# Patient Record
Sex: Female | Born: 1950 | Race: White | Hispanic: No | Smoking: Former smoker
Health system: Southern US, Community
[De-identification: ages and names within clinical notes are randomized; demographics above are authoritative.]

## PROBLEM LIST (undated history)

## (undated) DIAGNOSIS — I509 Heart failure, unspecified: Secondary | ICD-10-CM

## (undated) DIAGNOSIS — D509 Iron deficiency anemia, unspecified: Secondary | ICD-10-CM

## (undated) DIAGNOSIS — E119 Type 2 diabetes mellitus without complications: Secondary | ICD-10-CM

## (undated) DIAGNOSIS — J9600 Acute respiratory failure, unspecified whether with hypoxia or hypercapnia: Secondary | ICD-10-CM

## (undated) DIAGNOSIS — I10 Essential (primary) hypertension: Secondary | ICD-10-CM

## (undated) DIAGNOSIS — I2510 Atherosclerotic heart disease of native coronary artery without angina pectoris: Secondary | ICD-10-CM

## (undated) DIAGNOSIS — N183 Chronic kidney disease, stage 3 unspecified: Secondary | ICD-10-CM

## (undated) DIAGNOSIS — E079 Disorder of thyroid, unspecified: Secondary | ICD-10-CM

## (undated) DIAGNOSIS — E785 Hyperlipidemia, unspecified: Secondary | ICD-10-CM

## (undated) HISTORY — DX: Hyperlipidemia, unspecified: E78.5

## (undated) HISTORY — DX: Chronic kidney disease, stage 3 unspecified: N18.30

## (undated) HISTORY — DX: Atherosclerotic heart disease of native coronary artery without angina pectoris: I25.10

## (undated) HISTORY — DX: Chronic kidney disease, stage 3 (moderate): N18.3

---

## 2014-11-16 DIAGNOSIS — D509 Iron deficiency anemia, unspecified: Secondary | ICD-10-CM | POA: Insufficient documentation

## 2016-01-16 DIAGNOSIS — I5030 Unspecified diastolic (congestive) heart failure: Secondary | ICD-10-CM | POA: Insufficient documentation

## 2017-06-28 ENCOUNTER — Encounter: Payer: Self-pay | Admitting: Podiatry

## 2017-06-28 ENCOUNTER — Ambulatory Visit (INDEPENDENT_AMBULATORY_CARE_PROVIDER_SITE_OTHER): Payer: Medicare Other | Admitting: Podiatry

## 2017-06-28 DIAGNOSIS — B351 Tinea unguium: Secondary | ICD-10-CM

## 2017-06-28 DIAGNOSIS — E11621 Type 2 diabetes mellitus with foot ulcer: Secondary | ICD-10-CM | POA: Diagnosis not present

## 2017-06-28 DIAGNOSIS — E1159 Type 2 diabetes mellitus with other circulatory complications: Secondary | ICD-10-CM | POA: Diagnosis not present

## 2017-06-28 DIAGNOSIS — M79676 Pain in unspecified toe(s): Secondary | ICD-10-CM

## 2017-06-28 DIAGNOSIS — L97511 Non-pressure chronic ulcer of other part of right foot limited to breakdown of skin: Secondary | ICD-10-CM

## 2017-06-28 NOTE — Progress Notes (Signed)
This patient presents to the office with chief complaint of long thick painful nails. Patient states that her nails have not been treated for over a year.  She also says that she has developed spots that have developed on her second and third toes, right foot.  She says these areas bleed on occasion.  This patient says that she injured her second and third toes, years ago and never sought any treatment at that time.  She says she has provided no self treatment nor sought any professional help for her feet. She says she is part of a program where a nurse visits diabetics once  year for a complete physical .  The nurse recommended that she make an appointment with the podiatrist for a complete foot evaluation  GENERAL APPEARANCE: Alert, conversant. Appropriately groomed. No acute distress.  VASCULAR: Pedal pulses are not   palpable at  [facility] and PT bilateral.  Capillary refill time is immediate to all digits,  Normal temperature gradient.   NEUROLOGIC: sensation is diminished  to 5.07 monofilament at 5/5 sites bilateral.  Light touch is intact bilateral, Muscle strength normal.  MUSCULOSKELETAL: acceptable muscle strength, tone and stability bilateral.  Intrinsic muscluature intact bilateral.  Rectus appearance of foot and digits noted bilateral.  Second and third toes are in a dorsi flex position at the level of the DIPJ.   NAILS   thick disfigured discolored nails with subungual debris noted both feet DERMATOLOGIC: skin color, texture, and turgor are within normal limits with the exception of the second and third toes, right foot.  These toes have a dorsal ulceration noted on the right foot.  These ulcerations are noted at the level of the DIPJ.  There is also a purplish discoloration over the met heads of the second and third MPJs right foot.     Diagnosis  diabetic ulcer, second and third toes, right foot   onychomycosis 10   IE  debridement of onychomycotic nails both feet.  Debridement of necrotic  tissue on the dorsum of the second and third toes, right foot.  Ulcers were bandaged with Iodosorb and dry sterile dressing.  There appears to be no evidence of any acute or even chronic infection, second and third toes, right foot.  These toes became dislocated when she injured her toes and have led to a dorsiflexed position of the distal aspect of the second and third toes.  Due to the position. She has developed 2 ulcers.  We discussed her soaking and bandaging her foot at home for 2 weeks.  We will reevaluate her at that time and provide further treatment.  Possibly we can take a radiographic x-ray to understand her pathology   Gardiner Barefoot DPM

## 2017-07-13 ENCOUNTER — Ambulatory Visit (INDEPENDENT_AMBULATORY_CARE_PROVIDER_SITE_OTHER): Payer: Self-pay | Admitting: Podiatry

## 2017-07-13 DIAGNOSIS — E1159 Type 2 diabetes mellitus with other circulatory complications: Secondary | ICD-10-CM

## 2017-07-13 DIAGNOSIS — L97511 Non-pressure chronic ulcer of other part of right foot limited to breakdown of skin: Secondary | ICD-10-CM | POA: Diagnosis not present

## 2017-07-13 DIAGNOSIS — E11621 Type 2 diabetes mellitus with foot ulcer: Secondary | ICD-10-CM

## 2017-07-13 NOTE — Progress Notes (Signed)
This patient presents the office for continued evaluation of a diabetic ulcer on the second and third toes, right foot.  She says she has been soaking her foot and bandaged her foot.  She says that her toes feel fine and they look much better than the last visit 2 weeks ago. She says she is not having any pain or discomfort at this time.  She presents the office today for continued evaluation of these diabetic ulcers   GENERAL APPEARANCE: Alert, conversant. Appropriately groomed. No acute distress.  VASCULAR: Pedal [facility] are not   [facility]  [facility].  Capillary refill time is immediate to all digits,  Normal temperature gradient.  Digital hair growth is present bilateral  NEUROLOGIC: sensation is diminished to 5.07 monofilament at 5/5 sites bilateral.  Light touch is intact bilateral, Muscle strength normal.  MUSCULOSKELETAL: acceptable muscle strength, tone and stability bilateral.    Dorsiflex position of the second and third toes, right foot NAILS  thick disfigured discolored nails with subungual debris. No evidence of any bacterial infection or drainage DERMATOLOGIC: skin color, texture, and turgor are within normal limits.  No preulcerative lesions or ulcers  are seen, no interdigital maceration noted.  No open lesions present.   No drainage noted. Healing of diabetic ulcers. The second and third toes, right foot  S/P healed diabetic ulcer second and third toes right foot.   ROV.  Patient presents the office stating that she is having no pain and discomfort in her toes have healed.  She desires to return to the office in 10 weeks for continued diabetic foot care to prevent any future ulcers or infections.   Helane GuntherGregory Auron Tadros DPM

## 2017-07-24 ENCOUNTER — Observation Stay (HOSPITAL_COMMUNITY): Payer: Medicare Other

## 2017-07-24 ENCOUNTER — Inpatient Hospital Stay (HOSPITAL_COMMUNITY): Payer: Medicare Other

## 2017-07-24 ENCOUNTER — Encounter (HOSPITAL_COMMUNITY)
Admission: EM | Disposition: A | Discharge status: Home / Self Care | Payer: Self-pay | Source: Home / Self Care | Attending: Family Medicine

## 2017-07-24 ENCOUNTER — Inpatient Hospital Stay (HOSPITAL_COMMUNITY): Payer: Medicare Other | Admitting: Anesthesiology

## 2017-07-24 ENCOUNTER — Encounter (HOSPITAL_COMMUNITY): Payer: Self-pay | Admitting: Emergency Medicine

## 2017-07-24 ENCOUNTER — Inpatient Hospital Stay (HOSPITAL_COMMUNITY)
Admission: EM | Admit: 2017-07-24 | Discharge: 2017-07-30 | DRG: 580 | Disposition: A | Discharge status: Home / Self Care | Payer: Medicare Other | Attending: Family Medicine | Admitting: Family Medicine

## 2017-07-24 DIAGNOSIS — R2681 Unsteadiness on feet: Secondary | ICD-10-CM

## 2017-07-24 DIAGNOSIS — W1830XA Fall on same level, unspecified, initial encounter: Secondary | ICD-10-CM | POA: Diagnosis present

## 2017-07-24 DIAGNOSIS — I130 Hypertensive heart and chronic kidney disease with heart failure and stage 1 through stage 4 chronic kidney disease, or unspecified chronic kidney disease: Secondary | ICD-10-CM | POA: Diagnosis present

## 2017-07-24 DIAGNOSIS — J9811 Atelectasis: Secondary | ICD-10-CM | POA: Diagnosis not present

## 2017-07-24 DIAGNOSIS — Z794 Long term (current) use of insulin: Secondary | ICD-10-CM

## 2017-07-24 DIAGNOSIS — Z87891 Personal history of nicotine dependence: Secondary | ICD-10-CM

## 2017-07-24 DIAGNOSIS — I2510 Atherosclerotic heart disease of native coronary artery without angina pectoris: Secondary | ICD-10-CM | POA: Diagnosis present

## 2017-07-24 DIAGNOSIS — E662 Morbid (severe) obesity with alveolar hypoventilation: Secondary | ICD-10-CM | POA: Diagnosis present

## 2017-07-24 DIAGNOSIS — L03114 Cellulitis of left upper limb: Principal | ICD-10-CM | POA: Diagnosis present

## 2017-07-24 DIAGNOSIS — Z23 Encounter for immunization: Secondary | ICD-10-CM

## 2017-07-24 DIAGNOSIS — Z7901 Long term (current) use of anticoagulants: Secondary | ICD-10-CM

## 2017-07-24 DIAGNOSIS — N183 Chronic kidney disease, stage 3 (moderate): Secondary | ICD-10-CM | POA: Diagnosis present

## 2017-07-24 DIAGNOSIS — S61409A Unspecified open wound of unspecified hand, initial encounter: Secondary | ICD-10-CM | POA: Diagnosis present

## 2017-07-24 DIAGNOSIS — S2002XA Contusion of left breast, initial encounter: Secondary | ICD-10-CM | POA: Diagnosis present

## 2017-07-24 DIAGNOSIS — R0603 Acute respiratory distress: Secondary | ICD-10-CM | POA: Diagnosis present

## 2017-07-24 DIAGNOSIS — Z79899 Other long term (current) drug therapy: Secondary | ICD-10-CM

## 2017-07-24 DIAGNOSIS — E8779 Other fluid overload: Secondary | ICD-10-CM | POA: Diagnosis present

## 2017-07-24 DIAGNOSIS — L089 Local infection of the skin and subcutaneous tissue, unspecified: Secondary | ICD-10-CM | POA: Diagnosis present

## 2017-07-24 DIAGNOSIS — E1110 Type 2 diabetes mellitus with ketoacidosis without coma: Secondary | ICD-10-CM | POA: Diagnosis present

## 2017-07-24 DIAGNOSIS — I482 Chronic atrial fibrillation: Secondary | ICD-10-CM | POA: Diagnosis present

## 2017-07-24 DIAGNOSIS — R079 Chest pain, unspecified: Secondary | ICD-10-CM

## 2017-07-24 DIAGNOSIS — L0390 Cellulitis, unspecified: Secondary | ICD-10-CM | POA: Diagnosis present

## 2017-07-24 DIAGNOSIS — G4733 Obstructive sleep apnea (adult) (pediatric): Secondary | ICD-10-CM | POA: Diagnosis present

## 2017-07-24 DIAGNOSIS — R0902 Hypoxemia: Secondary | ICD-10-CM | POA: Diagnosis not present

## 2017-07-24 DIAGNOSIS — E1122 Type 2 diabetes mellitus with diabetic chronic kidney disease: Secondary | ICD-10-CM | POA: Diagnosis present

## 2017-07-24 DIAGNOSIS — S61412A Laceration without foreign body of left hand, initial encounter: Secondary | ICD-10-CM | POA: Diagnosis present

## 2017-07-24 DIAGNOSIS — E559 Vitamin D deficiency, unspecified: Secondary | ICD-10-CM | POA: Diagnosis present

## 2017-07-24 DIAGNOSIS — I96 Gangrene, not elsewhere classified: Secondary | ICD-10-CM | POA: Diagnosis present

## 2017-07-24 DIAGNOSIS — E785 Hyperlipidemia, unspecified: Secondary | ICD-10-CM | POA: Diagnosis present

## 2017-07-24 DIAGNOSIS — R0602 Shortness of breath: Secondary | ICD-10-CM

## 2017-07-24 DIAGNOSIS — R000 Tachycardia, unspecified: Secondary | ICD-10-CM | POA: Diagnosis present

## 2017-07-24 DIAGNOSIS — S61402A Unspecified open wound of left hand, initial encounter: Secondary | ICD-10-CM

## 2017-07-24 DIAGNOSIS — Y92000 Kitchen of unspecified non-institutional (private) residence as  the place of occurrence of the external cause: Secondary | ICD-10-CM

## 2017-07-24 DIAGNOSIS — D509 Iron deficiency anemia, unspecified: Secondary | ICD-10-CM | POA: Diagnosis present

## 2017-07-24 DIAGNOSIS — Z6841 Body Mass Index (BMI) 40.0 and over, adult: Secondary | ICD-10-CM

## 2017-07-24 DIAGNOSIS — L03119 Cellulitis of unspecified part of limb: Secondary | ICD-10-CM | POA: Diagnosis not present

## 2017-07-24 DIAGNOSIS — I5032 Chronic diastolic (congestive) heart failure: Secondary | ICD-10-CM | POA: Diagnosis present

## 2017-07-24 DIAGNOSIS — E039 Hypothyroidism, unspecified: Secondary | ICD-10-CM | POA: Diagnosis present

## 2017-07-24 HISTORY — DX: Type 2 diabetes mellitus without complications: E11.9

## 2017-07-24 HISTORY — DX: Iron deficiency anemia, unspecified: D50.9

## 2017-07-24 HISTORY — DX: Heart failure, unspecified: I50.9

## 2017-07-24 HISTORY — DX: Essential (primary) hypertension: I10

## 2017-07-24 HISTORY — DX: Disorder of thyroid, unspecified: E07.9

## 2017-07-24 HISTORY — PX: I & D EXTREMITY: SHX5045

## 2017-07-24 LAB — BASIC METABOLIC PANEL
Anion gap: 10 (ref 5–15)
BUN: 40 mg/dL — ABNORMAL HIGH (ref 6–20)
CALCIUM: 9.7 mg/dL (ref 8.9–10.3)
CO2: 26 mmol/L (ref 22–32)
CREATININE: 1.5 mg/dL — AB (ref 0.44–1.00)
Chloride: 101 mmol/L (ref 101–111)
GFR calc Af Amer: 40 mL/min — ABNORMAL LOW (ref >60)
GFR calc non Af Amer: 35 mL/min — ABNORMAL LOW (ref >60)
GLUCOSE: 274 mg/dL — AB (ref 65–99)
Potassium: 4.9 mmol/L (ref 3.5–5.1)
Sodium: 137 mmol/L (ref 135–145)

## 2017-07-24 LAB — CBC WITH DIFFERENTIAL/PLATELET
Basophils Absolute: 0 10*3/uL (ref 0.0–0.1)
Basophils Relative: 0 %
Eosinophils Absolute: 0.3 10*3/uL (ref 0.0–0.7)
Eosinophils Relative: 2 %
HEMATOCRIT: 43 % (ref 36.0–46.0)
Hemoglobin: 13.8 g/dL (ref 12.0–15.0)
LYMPHS ABS: 1.1 10*3/uL (ref 0.7–4.0)
LYMPHS PCT: 8 %
MCH: 30.4 pg (ref 26.0–34.0)
MCHC: 32.1 g/dL (ref 30.0–36.0)
MCV: 94.7 fL (ref 78.0–100.0)
MONO ABS: 1 10*3/uL (ref 0.1–1.0)
MONOS PCT: 7 %
NEUTROS ABS: 11.6 10*3/uL — AB (ref 1.7–7.7)
Neutrophils Relative %: 83 %
Platelets: 270 10*3/uL (ref 150–400)
RBC: 4.54 MIL/uL (ref 3.87–5.11)
RDW: 13.3 % (ref 11.5–15.5)
WBC: 14 10*3/uL — ABNORMAL HIGH (ref 4.0–10.5)

## 2017-07-24 LAB — URINALYSIS, ROUTINE W REFLEX MICROSCOPIC
BILIRUBIN URINE: NEGATIVE
Glucose, UA: 50 mg/dL — AB
Hgb urine dipstick: NEGATIVE
KETONES UR: NEGATIVE mg/dL
NITRITE: POSITIVE — AB
Protein, ur: 100 mg/dL — AB
SPECIFIC GRAVITY, URINE: 1.012 (ref 1.005–1.030)
pH: 6 (ref 5.0–8.0)

## 2017-07-24 LAB — I-STAT CG4 LACTIC ACID, ED: LACTIC ACID, VENOUS: 1.33 mmol/L (ref 0.5–1.9)

## 2017-07-24 LAB — GLUCOSE, CAPILLARY
GLUCOSE-CAPILLARY: 171 mg/dL — AB (ref 65–99)
GLUCOSE-CAPILLARY: 90 mg/dL (ref 65–99)

## 2017-07-24 LAB — CBG MONITORING, ED: Glucose-Capillary: 250 mg/dL — ABNORMAL HIGH (ref 65–99)

## 2017-07-24 SURGERY — IRRIGATION AND DEBRIDEMENT EXTREMITY
Anesthesia: General | Site: Hand | Laterality: Left

## 2017-07-24 MED ORDER — PROPOFOL 10 MG/ML IV BOLUS
INTRAVENOUS | Status: AC
  Filled 2017-07-24: qty 20

## 2017-07-24 MED ORDER — FENTANYL CITRATE (PF) 100 MCG/2ML IJ SOLN
INTRAMUSCULAR | Status: DC | PRN
  Administered 2017-07-24: 50 ug via INTRAVENOUS

## 2017-07-24 MED ORDER — VANCOMYCIN HCL 10 G IV SOLR
2000.0000 mg | Freq: Once | INTRAVENOUS | Status: AC
  Administered 2017-07-24: 2000 mg via INTRAVENOUS
  Filled 2017-07-24: qty 2000

## 2017-07-24 MED ORDER — PHENYLEPHRINE HCL 10 MG/ML IJ SOLN
INTRAMUSCULAR | Status: DC | PRN
  Administered 2017-07-24 (×4): 80 ug via INTRAVENOUS

## 2017-07-24 MED ORDER — NITROGLYCERIN 0.4 MG SL SUBL: 0.4000 mg | SUBLINGUAL_TABLET | SUBLINGUAL | Status: DC | PRN

## 2017-07-24 MED ORDER — LEVALBUTEROL HCL 0.63 MG/3ML IN NEBU: 0.6300 mg | INHALATION_SOLUTION | Freq: Three times a day (TID) | RESPIRATORY_TRACT | Status: DC

## 2017-07-24 MED ORDER — CEFAZOLIN SODIUM-DEXTROSE 2-4 GM/100ML-% IV SOLN
2.0000 g | Freq: Four times a day (QID) | INTRAVENOUS | Status: DC
  Administered 2017-07-25: 2 g via INTRAVENOUS
  Filled 2017-07-24 (×3): qty 100

## 2017-07-24 MED ORDER — TETANUS-DIPHTH-ACELL PERTUSSIS 5-2.5-18.5 LF-MCG/0.5 IM SUSP
0.5000 mL | Freq: Once | INTRAMUSCULAR | Status: AC
  Administered 2017-07-24: 0.5 mL via INTRAMUSCULAR
  Filled 2017-07-24: qty 0.5

## 2017-07-24 MED ORDER — PROPOFOL 10 MG/ML IV BOLUS
INTRAVENOUS | Status: DC | PRN
  Administered 2017-07-24: 50 mg via INTRAVENOUS
  Administered 2017-07-24: 80 mg via INTRAVENOUS

## 2017-07-24 MED ORDER — ROSUVASTATIN CALCIUM 10 MG PO TABS
10.0000 mg | ORAL_TABLET | Freq: Every day | ORAL | Status: DC
  Administered 2017-07-25 – 2017-07-30 (×6): 10 mg via ORAL
  Filled 2017-07-24 (×6): qty 1

## 2017-07-24 MED ORDER — ONDANSETRON HCL 4 MG/2ML IJ SOLN
INTRAMUSCULAR | Status: AC
  Filled 2017-07-24: qty 2

## 2017-07-24 MED ORDER — SODIUM CHLORIDE 0.9 % IV BOLUS (SEPSIS)
1000.0000 mL | Freq: Once | INTRAVENOUS | Status: AC
  Administered 2017-07-24: 1000 mL via INTRAVENOUS

## 2017-07-24 MED ORDER — INSULIN ASPART 100 UNIT/ML ~~LOC~~ SOLN
0.0000 [IU] | Freq: Three times a day (TID) | SUBCUTANEOUS | Status: DC
  Administered 2017-07-25: 2 [IU] via SUBCUTANEOUS
  Administered 2017-07-25: 5 [IU] via SUBCUTANEOUS
  Administered 2017-07-25: 2 [IU] via SUBCUTANEOUS
  Administered 2017-07-26: 5 [IU] via SUBCUTANEOUS
  Administered 2017-07-26: 2 [IU] via SUBCUTANEOUS
  Administered 2017-07-26 – 2017-07-28 (×3): 3 [IU] via SUBCUTANEOUS
  Administered 2017-07-28: 5 [IU] via SUBCUTANEOUS
  Administered 2017-07-28: 8 [IU] via SUBCUTANEOUS
  Administered 2017-07-29: 2 [IU] via SUBCUTANEOUS
  Administered 2017-07-29 – 2017-07-30 (×2): 3 [IU] via SUBCUTANEOUS

## 2017-07-24 MED ORDER — FUROSEMIDE 10 MG/ML IJ SOLN
40.0000 mg | Freq: Once | INTRAMUSCULAR | Status: AC
  Administered 2017-07-24: 40 mg via INTRAVENOUS

## 2017-07-24 MED ORDER — SODIUM CHLORIDE 0.9 % IV BOLUS (SEPSIS): 1000.0000 mL | Freq: Once | INTRAVENOUS | Status: DC

## 2017-07-24 MED ORDER — CEFAZOLIN SODIUM-DEXTROSE 2-4 GM/100ML-% IV SOLN
INTRAVENOUS | Status: AC
  Filled 2017-07-24: qty 100

## 2017-07-24 MED ORDER — IPRATROPIUM BROMIDE 0.02 % IN SOLN
RESPIRATORY_TRACT | Status: AC
  Administered 2017-07-24: 0.5 mg
  Filled 2017-07-24: qty 2.5

## 2017-07-24 MED ORDER — CEFAZOLIN SODIUM-DEXTROSE 2-4 GM/100ML-% IV SOLN
2.0000 g | Freq: Three times a day (TID) | INTRAVENOUS | Status: DC
  Administered 2017-07-24: 2 g via INTRAVENOUS

## 2017-07-24 MED ORDER — ALBUMIN HUMAN 5 % IV SOLN
12.5000 g | Freq: Once | INTRAVENOUS | Status: AC
  Administered 2017-07-24: 12.5 g via INTRAVENOUS

## 2017-07-24 MED ORDER — ENOXAPARIN SODIUM 40 MG/0.4ML ~~LOC~~ SOLN: 40.0000 mg | SUBCUTANEOUS | Status: DC

## 2017-07-24 MED ORDER — ALBUMIN HUMAN 5 % IV SOLN
INTRAVENOUS | Status: AC
  Filled 2017-07-24: qty 250

## 2017-07-24 MED ORDER — SUCCINYLCHOLINE CHLORIDE 20 MG/ML IJ SOLN
INTRAMUSCULAR | Status: DC | PRN
  Administered 2017-07-24: 100 mg via INTRAVENOUS

## 2017-07-24 MED ORDER — VANCOMYCIN HCL IN DEXTROSE 1-5 GM/200ML-% IV SOLN: 1000.0000 mg | Freq: Once | INTRAVENOUS | Status: DC

## 2017-07-24 MED ORDER — MIDAZOLAM HCL 5 MG/5ML IJ SOLN
INTRAMUSCULAR | Status: DC | PRN
  Administered 2017-07-24: 2 mg via INTRAVENOUS

## 2017-07-24 MED ORDER — METOPROLOL TARTRATE 50 MG PO TABS
50.0000 mg | ORAL_TABLET | Freq: Two times a day (BID) | ORAL | Status: DC
  Administered 2017-07-25 – 2017-07-30 (×12): 50 mg via ORAL
  Filled 2017-07-24 (×12): qty 1

## 2017-07-24 MED ORDER — 0.9 % SODIUM CHLORIDE (POUR BTL) OPTIME
TOPICAL | Status: DC | PRN
  Administered 2017-07-24: 1000 mL

## 2017-07-24 MED ORDER — VANCOMYCIN HCL 10 G IV SOLR: 1500.0000 mg | INTRAVENOUS | Status: DC

## 2017-07-24 MED ORDER — HEPARIN SODIUM (PORCINE) 5000 UNIT/ML IJ SOLN: 5000.0000 [IU] | Freq: Three times a day (TID) | INTRAMUSCULAR | Status: DC

## 2017-07-24 MED ORDER — PIPERACILLIN-TAZOBACTAM 3.375 G IVPB: 3.3750 g | Freq: Three times a day (TID) | INTRAVENOUS | Status: DC

## 2017-07-24 MED ORDER — MIDAZOLAM HCL 2 MG/2ML IJ SOLN
INTRAMUSCULAR | Status: AC
  Filled 2017-07-24: qty 2

## 2017-07-24 MED ORDER — ONDANSETRON HCL 4 MG/2ML IJ SOLN
INTRAMUSCULAR | Status: DC | PRN
  Administered 2017-07-24: 4 mg via INTRAVENOUS

## 2017-07-24 MED ORDER — FUROSEMIDE 40 MG PO TABS
40.0000 mg | ORAL_TABLET | Freq: Every day | ORAL | Status: DC
  Administered 2017-07-25 – 2017-07-30 (×6): 40 mg via ORAL
  Filled 2017-07-24 (×6): qty 1

## 2017-07-24 MED ORDER — ACETAMINOPHEN 500 MG PO TABS
1000.0000 mg | ORAL_TABLET | Freq: Four times a day (QID) | ORAL | Status: DC | PRN
  Administered 2017-07-25 – 2017-07-30 (×7): 1000 mg via ORAL
  Filled 2017-07-24 (×6): qty 2

## 2017-07-24 MED ORDER — LIDOCAINE HCL (CARDIAC) 20 MG/ML IV SOLN
INTRAVENOUS | Status: DC | PRN
  Administered 2017-07-24: 60 mg via INTRAVENOUS

## 2017-07-24 MED ORDER — LACTATED RINGERS IV SOLN
INTRAVENOUS | Status: DC | PRN
  Administered 2017-07-24: 19:00:00 via INTRAVENOUS

## 2017-07-24 MED ORDER — HYDROMORPHONE HCL 1 MG/ML IJ SOLN: 0.2500 mg | INTRAMUSCULAR | Status: DC | PRN

## 2017-07-24 MED ORDER — FUROSEMIDE 10 MG/ML IJ SOLN
INTRAMUSCULAR | Status: AC
  Filled 2017-07-24: qty 4

## 2017-07-24 MED ORDER — PIPERACILLIN-TAZOBACTAM 3.375 G IVPB 30 MIN
3.3750 g | Freq: Once | INTRAVENOUS | Status: AC
  Administered 2017-07-24: 3.375 g via INTRAVENOUS
  Filled 2017-07-24: qty 50

## 2017-07-24 MED ORDER — ZINC OXIDE 40 % EX OINT
TOPICAL_OINTMENT | CUTANEOUS | Status: DC | PRN
  Filled 2017-07-24: qty 114

## 2017-07-24 MED ORDER — FENTANYL CITRATE (PF) 250 MCG/5ML IJ SOLN
INTRAMUSCULAR | Status: AC
  Filled 2017-07-24: qty 5

## 2017-07-24 MED ORDER — LEVOTHYROXINE SODIUM 25 MCG PO TABS
137.0000 ug | ORAL_TABLET | Freq: Every day | ORAL | Status: DC
  Administered 2017-07-25 – 2017-07-30 (×6): 137 ug via ORAL
  Filled 2017-07-24 (×7): qty 1

## 2017-07-24 MED ORDER — INSULIN GLARGINE 100 UNIT/ML ~~LOC~~ SOLN
16.0000 [IU] | Freq: Every day | SUBCUTANEOUS | Status: DC
  Administered 2017-07-25 (×2): 16 [IU] via SUBCUTANEOUS
  Filled 2017-07-24 (×2): qty 0.16

## 2017-07-24 SURGICAL SUPPLY — 56 items
BANDAGE ACE 3X5.8 VEL STRL LF (GAUZE/BANDAGES/DRESSINGS) ×3 IMPLANT
BANDAGE ACE 4X5 VEL STRL LF (GAUZE/BANDAGES/DRESSINGS) IMPLANT
BANDAGE ACE 6X5 VEL STRL LF (GAUZE/BANDAGES/DRESSINGS) IMPLANT
BANDAGE ESMARK 6X9 LF (GAUZE/BANDAGES/DRESSINGS) IMPLANT
BLADE SURG 10 STRL SS (BLADE) IMPLANT
BNDG COHESIVE 4X5 TAN STRL (GAUZE/BANDAGES/DRESSINGS) IMPLANT
BNDG ESMARK 4X9 LF (GAUZE/BANDAGES/DRESSINGS) ×3 IMPLANT
BNDG ESMARK 6X9 LF (GAUZE/BANDAGES/DRESSINGS)
BNDG GAUZE ELAST 4 BULKY (GAUZE/BANDAGES/DRESSINGS) IMPLANT
CONT SPEC 4OZ CLIKSEAL STRL BL (MISCELLANEOUS) IMPLANT
COVER SURGICAL LIGHT HANDLE (MISCELLANEOUS) ×3 IMPLANT
CUFF TOURN SGL LL 12 NO SLV (MISCELLANEOUS) ×3 IMPLANT
CUFF TOURNIQUET SINGLE 34IN LL (TOURNIQUET CUFF) IMPLANT
DRAPE SURG 17X23 STRL (DRAPES) ×3 IMPLANT
DRAPE U-SHAPE 47X51 STRL (DRAPES) IMPLANT
DRSG ADAPTIC 3X8 NADH LF (GAUZE/BANDAGES/DRESSINGS) ×3 IMPLANT
DRSG PAD ABDOMINAL 8X10 ST (GAUZE/BANDAGES/DRESSINGS) IMPLANT
DURAPREP 26ML APPLICATOR (WOUND CARE) IMPLANT
ELECT CAUTERY BLADE 6.4 (BLADE) ×3 IMPLANT
ELECT REM PT RETURN 9FT ADLT (ELECTROSURGICAL) ×3
ELECTRODE REM PT RTRN 9FT ADLT (ELECTROSURGICAL) ×1 IMPLANT
EVACUATOR 1/8 PVC DRAIN (DRAIN) IMPLANT
FACESHIELD WRAPAROUND (MASK) IMPLANT
GAUZE SPONGE 4X4 12PLY STRL (GAUZE/BANDAGES/DRESSINGS) ×3 IMPLANT
GAUZE SPONGE 4X4 12PLY STRL LF (GAUZE/BANDAGES/DRESSINGS) ×3 IMPLANT
GAUZE XEROFORM 1X8 LF (GAUZE/BANDAGES/DRESSINGS) IMPLANT
GLOVE BIO SURGEON STRL SZ7.5 (GLOVE) ×3 IMPLANT
GLOVE BIOGEL PI IND STRL 7.0 (GLOVE) ×2 IMPLANT
GLOVE BIOGEL PI IND STRL 8 (GLOVE) ×1 IMPLANT
GLOVE BIOGEL PI INDICATOR 7.0 (GLOVE) ×4
GLOVE BIOGEL PI INDICATOR 8 (GLOVE) ×2
GOWN STRL REUS W/ TWL LRG LVL3 (GOWN DISPOSABLE) ×2 IMPLANT
GOWN STRL REUS W/TWL LRG LVL3 (GOWN DISPOSABLE) ×4
HANDPIECE INTERPULSE COAX TIP (DISPOSABLE)
KIT BASIN OR (CUSTOM PROCEDURE TRAY) ×3 IMPLANT
KIT ROOM TURNOVER OR (KITS) ×3 IMPLANT
MANIFOLD NEPTUNE II (INSTRUMENTS) ×3 IMPLANT
NEEDLE 25GAX1.5 (MISCELLANEOUS) IMPLANT
NS IRRIG 1000ML POUR BTL (IV SOLUTION) ×3 IMPLANT
PACK ORTHO EXTREMITY (CUSTOM PROCEDURE TRAY) ×3 IMPLANT
PAD ARMBOARD 7.5X6 YLW CONV (MISCELLANEOUS) ×6 IMPLANT
SET HNDPC FAN SPRY TIP SCT (DISPOSABLE) IMPLANT
SPONGE LAP 18X18 X RAY DECT (DISPOSABLE) IMPLANT
STOCKINETTE IMPERVIOUS 9X36 MD (GAUZE/BANDAGES/DRESSINGS) IMPLANT
SUT ETHILON 3 0 PS 1 (SUTURE) IMPLANT
SUT PDS AB 2-0 CT1 27 (SUTURE) IMPLANT
SWAB COLLECTION DEVICE MRSA (MISCELLANEOUS) ×3 IMPLANT
SWAB CULTURE ESWAB REG 1ML (MISCELLANEOUS) ×3 IMPLANT
SYR CONTROL 10ML LL (SYRINGE) IMPLANT
TOWEL OR 17X24 6PK STRL BLUE (TOWEL DISPOSABLE) IMPLANT
TOWEL OR 17X26 10 PK STRL BLUE (TOWEL DISPOSABLE) ×3 IMPLANT
TUBE CONNECTING 12'X1/4 (SUCTIONS) ×1
TUBE CONNECTING 12X1/4 (SUCTIONS) ×2 IMPLANT
TUBING CYSTO DISP (UROLOGICAL SUPPLIES) ×3 IMPLANT
UNDERPAD 30X30 (UNDERPADS AND DIAPERS) ×3 IMPLANT
YANKAUER SUCT BULB TIP NO VENT (SUCTIONS) ×3 IMPLANT

## 2017-07-24 NOTE — Anesthesia Procedure Notes (Signed)
Procedure Name: LMA Insertion Date/Time: 07/24/2017 6:58 PM Performed by: Arlice ColtMANESS, Kenley Troop B Pre-anesthesia Checklist: Patient identified, Emergency Drugs available, Suction available and Patient being monitored Patient Re-evaluated:Patient Re-evaluated prior to induction Oxygen Delivery Method: Circle System Utilized Preoxygenation: Pre-oxygenation with 100% oxygen Induction Type: IV induction Ventilation: Mask ventilation without difficulty LMA: LMA inserted LMA Size: 4.0 Number of attempts: 1 Airway Equipment and Method: Bite block Placement Confirmation: positive ETCO2 Tube secured with: Tape Dental Injury: Teeth and Oropharynx as per pre-operative assessment

## 2017-07-24 NOTE — Anesthesia Preprocedure Evaluation (Addendum)
Anesthesia Evaluation  Patient identified by MRN, date of birth, ID band Patient awake    Reviewed: Allergy & Precautions, H&P , NPO status , Patient's Chart, lab work & pertinent test results, reviewed documented beta blocker date and time   Airway Mallampati: II  TM Distance: >3 FB Neck ROM: Full    Dental no notable dental hx. (+) Upper Dentures, Lower Dentures, Dental Advisory Given   Pulmonary neg pulmonary ROS,    Pulmonary exam normal breath sounds clear to auscultation       Cardiovascular hypertension, Pt. on medications and Pt. on home beta blockers + dysrhythmias Atrial Fibrillation  Rhythm:Irregular Rate:Normal     Neuro/Psych negative neurological ROS  negative psych ROS   GI/Hepatic negative GI ROS, Neg liver ROS,   Endo/Other  diabetes, Insulin Dependent, Oral Hypoglycemic AgentsHypothyroidism Morbid obesity  Renal/GU Renal InsufficiencyRenal diseasenegative Renal ROS  negative genitourinary   Musculoskeletal   Abdominal   Peds  Hematology negative hematology ROS (+) anemia ,   Anesthesia Other Findings   Reproductive/Obstetrics negative OB ROS                            Anesthesia Physical Anesthesia Plan  ASA: III  Anesthesia Plan: General   Post-op Pain Management:    Induction: Intravenous  PONV Risk Score and Plan: 4 or greater and Ondansetron, Dexamethasone and Midazolam  Airway Management Planned: LMA  Additional Equipment:   Intra-op Plan:   Post-operative Plan: Extubation in OR  Informed Consent: I have reviewed the patients History and Physical, chart, labs and discussed the procedure including the risks, benefits and alternatives for the proposed anesthesia with the patient or authorized representative who has indicated his/her understanding and acceptance.   Dental advisory given  Plan Discussed with: CRNA  Anesthesia Plan Comments:         Anesthesia Quick Evaluation

## 2017-07-24 NOTE — Consult Note (Signed)
ORTHOPAEDIC CONSULTATION  REQUESTING PHYSICIAN: Zenia Resides, MD  Chief Complaint: left hand pain  HPI: Sylvia Craig is a 66 y.o. female who complains of a traumatic would to dorsal hand 1.5wks ago. Her pain has worsened over last few days. She has seen increasing redness and drainage. She denies fevers up until feeling unwell today  Past Medical History:  Diagnosis Date  . CHF (congestive heart failure) ([place])   . Diabetes mellitus without complication ([place])   . Hypertension   . Iron (Fe) deficiency anemia   . Thyroid disease    History reviewed. No pertinent surgical history. Social History   Social History  . Marital status: Legally Separated    Spouse name: N/A  . Number of children: N/A  . Years of education: N/A   Social History Main Topics  . Smoking status: Unknown If Ever Smoked  . Smokeless tobacco: Never Used  . Alcohol use No  . Drug use: No  . Sexual activity: Not Asked   Other Topics Concern  . None   Social History Narrative  . None   No family history on file. Allergies  Allergen Reactions  . Shellfish-Derived Products Shortness Of Breath  . Codeine Other (See Comments)    Rash, edema  . Atorvastatin Other (See Comments)    Myalgias   . Lisinopril Other (See Comments)    Cough   . Other Other (See Comments)    Crab, chest tightness  . Oxycodone-Aspirin Other (See Comments)    Rash, dreams   Prior to Admission medications   Medication Sig Start Date End Date Taking? Authorizing Provider  acetaminophen (TYLENOL) 500 MG tablet Take 1,000 mg by mouth every 6 (six) hours as needed for moderate pain or headache.    Yes [provider]  apixaban (ELIQUIS) 2.5 MG TABS tablet TAKE 2.5 MG BY MOUTH 2 TIMES A DAY 07/16/15  Yes [provider]  Cholecalciferol (VITAMIN D-1000 MAX ST) 1000 units tablet Take 1,000 Units by mouth daily.    Yes [provider]  ferrous sulfate 325 (65 FE) MG tablet Take 325 mg by mouth 2  (two) times daily with a meal.   Yes [provider]  furosemide (LASIX) 40 MG tablet Take 40 mg by mouth 2 (two) times daily.  06/11/16  Yes [provider]  glimepiride (AMARYL) 4 MG tablet Take 4 mg by mouth 2 (two) times daily.    Yes [provider]  insulin glargine (LANTUS) 100 UNIT/ML injection Inject 26 Units into the skin at bedtime.   Yes [provider]  insulin lispro (HUMALOG KWIKPEN) 100 UNIT/ML KiwkPen 6 Units SQ 3 (three) times daily with meals. 06/16/16  Yes [provider]  levothyroxine (SYNTHROID, LEVOTHROID) 137 MCG tablet Take 137 mcg by mouth daily before breakfast.    Yes [provider]  losartan (COZAAR) 25 MG tablet Take 25 mg by mouth daily.  10/31/14  Yes [provider]  metoprolol succinate (TOPROL-XL) 100 MG 24 hr tablet Take 100 mg by mouth 2 (two) times daily.  04/15/15  Yes [provider]  nitroGLYCERIN (NITROSTAT) 0.4 MG SL tablet Take 0.4 mg under the toungue as needed for chest pain 12/18/14  Yes [provider]  rosuvastatin (CRESTOR) 10 MG tablet Take 10 mg by mouth daily.  06/04/16  Yes [provider]  vitamin C (ASCORBIC ACID) 500 MG tablet Take 1,000 mg by mouth at bedtime.    Yes [provider]  No results found.  Positive ROS: All other systems have been reviewed and were otherwise negative with the exception of those mentioned in the HPI and as above.  Labs cbc  Recent Labs  07/24/17 1313  WBC 14.0*  HGB 13.8  HCT 43.0  PLT 270    Labs inflam No results for input(s): CRP in the last 72 hours.  Invalid input(s): ESR  Labs coag No results for input(s): INR, PTT in the last 72 hours.  Invalid input(s): PT   Recent Labs  07/24/17 1313  NA 137  K 4.9  CL 101  CO2 26  GLUCOSE 274*  BUN 40*  CREATININE 1.50*  CALCIUM 9.7    Physical Exam: Vitals:   07/24/17 1500 07/24/17 1530  BP: 121/70 (!) 141/77  Pulse: 80 91  Resp: 16 (!)  21   General: Alert, no acute distress Cardiovascular: No pedal edema Respiratory: No cyanosis, no use of accessory musculature GI: No organomegaly, abdomen is soft and non-tender Skin: No lesions in the area of chief complaint other than those listed below in MSK exam.  Neurologic: Sensation intact distally save for the below mentioned MSK exam Psychiatric: Patient is competent for consent with normal mood and affect Lymphatic: No axillary or cervical lymphadenopathy  MUSCULOSKELETAL:  LUE: She is NVI, compartments soft. Some erythema and purulent drainage. Extensor tendons limited motion but appear to be intact.  Other extremities are atraumatic with painless ROM and NVI.  Assessment: Left hand infection  Plan: OR today for debridement   Renette Butters, MD Cell [phone]   07/24/2017 4:32 PM

## 2017-07-24 NOTE — ED Notes (Signed)
Attempted IV stick 1x unsuccessful

## 2017-07-24 NOTE — Progress Notes (Addendum)
FPTS Interim Progress Note  S: Called by PACU due to patient with respiratory distress. D/w anesthesia - given 700cc fluid during case, required phenylephrine several times.  O: BP 124/75   Pulse 93   Temp (!) 97.3 F (36.3 C)   Resp (!) 22   Ht 5\' 5"  (1.651 m)   Wt 267 lb (121.1 kg)   SpO2 100%   BMI 44.43 kg/m   Gen: alert, oriented, nonrebreather in place  CV: no murmur, regular rate Resp: mild crackles in bilateral bases, easy WOB Ext: L hand wrapped in ace C/D/I  A/P: Respiratory distress: likely fluid overload 2/2 NS boluses from code sepsis. S/p 40mg  IV Lasix in PACU, CXR ordered (no frank edema, shallow inspiration with atelectasis), atrovent neb given. Unlikely due to antibiotic de escalation as patient still covered by vanc/zosyn doses. Continue ancef. Respiratory status continued to improve during my assessment.  -wean O2 as tolerated > successfully weaned to [place] during my assessment -transfer to stepdown for continued monitoring of respiratory status -place foley for strict I/os -notable skin irritation of bilateral inner thighs - ordered desitin PRN -monitor volume status clinically  -aggressive pulmonary toilet with incentive spirometry  Garth Bignessimberlake, Kathryn, MD 07/24/2017, 9:32 PM PGY-2, [facility] pager [phone]

## 2017-07-24 NOTE — Anesthesia Postprocedure Evaluation (Signed)
Anesthesia Post Note  Patient: Sylvia Craig  Procedure(s) Performed: Procedure(s) (LRB): IRRIGATION AND DEBRIDEMENT EXTREMITY (Left)     Patient location during evaluation: PACU Anesthesia Type: General Level of consciousness: awake and alert Pain management: pain level controlled Vital Signs Assessment: post-procedure vital signs reviewed and stable Respiratory status: spontaneous breathing, nonlabored ventilation, respiratory function stable and patient connected to nasal cannula oxygen Cardiovascular status: blood pressure returned to baseline and stable Postop Assessment: no signs of nausea or vomiting Anesthetic complications: no    Last Vitals:  Vitals:   07/24/17 2145 07/24/17 2200  BP: 135/79 (!) 103/52  Pulse: 89 (!) 107  Resp: (!) 21 (!) 25  Temp:  36.5 C    Last Pain:  Vitals:   07/24/17 1308  TempSrc:   PainSc: 1                  Verneal Wiers,W. EDMOND

## 2017-07-24 NOTE — ED Notes (Signed)
ED Provider at bedside. 

## 2017-07-24 NOTE — Discharge Summary (Signed)
[facility] Summary  Patient name: Sylvia Craig Medical record number: [id number] Date of birth: [birth year] Age: 66 y.o. Gender: female Date of Admission: 07/24/2017  Date of Discharge: 07/31/17  Admitting Physician: Garth BignessKathryn Timberlake, MD  Primary Care Provider: System, Pcp Not In Consultants: hand surgery   Indication for Hospitalization: left hand infection   Discharge Diagnoses/Problem List:  Left hand infection  Respiratory distress  Skin irritation  T2DM HTN HFpEF Atrial fibrillation  HLD CKD stage 3 Iron deficiency anemia [name] Hypothyroidism     Disposition: Home with out patient PT/OT  Discharge Condition: stable, improving  Discharge Exam:  General: awake and alert, NAD, sitting up in bed  Cardiovascular: RRR, no MRG Respiratory: CTAB, no wheezes, rales, or rhonchi  Abdomen: soft, non tender, non distended, bowel sounds x 4 quadrants  Extremities: soft, no edema, 2 sec capillary refill bilaterally, left arm wrapped in dressing.   Brief Hospital Course:  Sylvia Craig is a 66 y.o. female who presented on 7/29 with left hand infection 2/2 mechanical fall 10 days prior to admission. PMH is significant for T2DM, HTN, HLD, morbid obesity, vitamin D deficiency, CKD stage 3, iron deficiency anemia, [name] s/p triple vessel cath in 2015 with no stents, CHF NYHA class 2, a fib, and hypothyroidism. Patient had laceration to left hand following fall which progressively worsened and was purulent on admission. Xray of hand showing no acute osseous abnormality, and soft tissue injury overlying the dorsal aspect of the hand. CXR showing cardiomegaly, and no acute cardiopulmonary process with no fractures. Hand surgery was consulted and patient was taken to OR on 7/29 for debridement. Hand surgery recommendations to restart Elliquis. Patient given IV vancomycin and IV Zosyn in ED. Patient started cefazolin 2 g IV by FPTS and was continued for a total of  5 days. Patient was transitioned to oral Keflex for antibiotic coverage. Patient in PACU following hand debridement went into respiratory distress 2/2 fluid overload due to NS boluses from code sepsis. 40 mg IV lasix given in PACU. Patient was placed on O2 via nasal canula and continued on oral lasix at home dose. Patient underwent second debridement by Plastic surgery on 08/01. During her stay, patient experience multiple oxygen desaturation episodes overnight. Pt has a h/o OSA w/o use of her CPAP at home. Patient discharge with home O2 at discharge. At time of discharge patient showed clinical improvement.   Issues for Follow Up:  1. Follow up regarding hand infection - plastics recommended 1 week follow up outpatient. Keflex end date 07/31/17.  2. Follow up for outpatient OT.  3. Concern about insulin non-adherence given good glucose control in the hospital on reduced lantus dose. Please continue to evaluate and counsel patient regarding this after DC. Patient discharged on Lantus 18U and humalog 4U TID with meals. Patient instructed to bring her CBG log to hospital f/u appt.  4. Follow blood pressure - previously on toprol XL 100mg  BID (unclear why BID) and losartan 25mg  QD. Due to good BP and HR control inpatient on 50mg  metoprolol tartrate BID, discharged on 100mg  toprol XL and losartan was held. Given CHF, would like to restart losartan if BP can tolerate in the future vs downtitrate toprol XL and add losartan at a reduced dose.  5. Iron deficiency anemia - patient instructed to restart iron once antibiotic course is completed.  6. New O2 requirement - mostly while asleep. Likely due to OSA vs combined OSA/OHS. Discharged with 2L [place] until able to  obtain/find her CPAP at home. May need to reorder sleep study if out of date. Patient briefly counseled about weight loss prior to discharge, please continue.   Significant Procedures: irrigation and debridement of left hand   Significant Labs and Imaging:    Recent Labs Lab 07/28/17 0230 07/29/17 0341 07/30/17 0311  WBC 11.8* 13.9* 9.7  HGB 12.9 12.7 11.9*  HCT 41.0 40.8 37.9  PLT 242 257 221    Recent Labs Lab 07/25/17 0735 07/26/17 0553 07/28/17 0230 07/29/17 0341 07/30/17 0311  NA 138 135 137 139 141  K 5.1 5.1 5.5* 4.7 4.5  CL 105 101 103 102 104  CO2 25 28 26 31  33*  GLUCOSE 138* 214* 303* 177* 214*  BUN 35* 37* 42* 56* 47*  CREATININE 1.43* 1.59* 1.33* 1.31* 1.19*  CALCIUM 9.0 8.8* 9.0 9.3 9.1   Lactic Acid, Venous    Component Value Date/Time   LATICACIDVEN 1.33 07/24/2017 1429    Dg Chest 2 View  Result Date: 07/24/2017 CLINICAL DATA:  Patient status post fall. EXAM: CHEST  2 VIEW COMPARISON:  None. FINDINGS: Monitoring leads overlie the patient. Cardiomegaly. No consolidative pulmonary opacities. No pleural effusion or pneumothorax. Thoracic spine degenerative changes. IMPRESSION: Cardiomegaly.  No acute cardiopulmonary process. Electronically Signed   By: Annia Beltrew  Davis M.D.   On: 07/24/2017 16:58   Dg Chest Port 1 View  Result Date: 07/24/2017 CLINICAL DATA:  Shortness of breath.  Postoperative patient. EXAM: PORTABLE CHEST 1 VIEW COMPARISON:  07/24/2017 FINDINGS: Shallow inspiration with atelectasis in the lung bases. Cardiac enlargement. Pulmonary vascularity is normal for technique. No consolidation or edema. No blunting of costophrenic angles. No pneumothorax. Calcification of the aorta. Old healed fracture deformity of the left clavicle. IMPRESSION: Shallow inspiration with atelectasis in the lung bases. Cardiac enlargement. No consolidation. Electronically Signed   By: Burman NievesWilliam  Stevens M.D.   On: 07/24/2017 21:27   Dg Hand Complete Left  Result Date: 07/24/2017 CLINICAL DATA:  Patient status post fall. Left hand wound around the posterior aspect of the carpal bones. Initial encounter. EXAM: LEFT HAND - COMPLETE 3+ VIEW COMPARISON:  None. FINDINGS: Soft tissue defect overlying the dorsal aspect of the hand.  Vascular calcifications. Normal anatomic alignment. No evidence for acute fracture or dislocation. IMPRESSION: No acute osseous abnormality. Soft tissue injury overlying the dorsal aspect of the hand. Electronically Signed   By: Annia Beltrew  Davis M.D.   On: 07/24/2017 16:58      Results/Tests Pending at Time of Discharge: None  Discharge Medications:  Allergies as of 07/30/2017      Reactions   Shellfish-derived Products Shortness Of Breath   Codeine Other (See Comments)   Rash, edema   Atorvastatin Other (See Comments)   Myalgias    Lisinopril Other (See Comments)   Cough    Other Other (See Comments)   Crab, chest tightness   Oxycodone-aspirin Other (See Comments)   Rash, dreams      Medication List    STOP taking these medications   ferrous sulfate 325 (65 FE) MG tablet   glimepiride 4 MG tablet Commonly known as:  AMARYL   losartan 25 MG tablet Commonly known as:  COZAAR     TAKE these medications   acetaminophen 500 MG tablet Commonly known as:  TYLENOL Take 1,000 mg by mouth every 6 (six) hours as needed for moderate pain or headache.   apixaban 2.5 MG Tabs tablet Commonly known as:  ELIQUIS Take 2 tablets (5 mg total) by  mouth 2 (two) times daily. What changed:  See the new instructions.   cephALEXin 500 MG capsule Commonly known as:  KEFLEX Take 1 capsule (500 mg total) by mouth every 12 (twelve) hours.   furosemide 40 MG tablet Commonly known as:  LASIX Take 1 tablet (40 mg total) by mouth daily. What changed:  when to take this   insulin glargine 100 UNIT/ML injection Commonly known as:  LANTUS Inject 26 Units into the skin at bedtime.   insulin lispro 100 UNIT/ML KiwkPen Commonly known as:  HUMALOG KWIKPEN Inject 0.04 mLs (4 Units total) into the skin 3 (three) times daily. What changed:  See the new instructions.   levothyroxine 137 MCG tablet Commonly known as:  SYNTHROID, LEVOTHROID Take 137 mcg by mouth daily before breakfast.   metoprolol  succinate 100 MG 24 hr tablet Commonly known as:  TOPROL-XL Take 1 tablet (100 mg total) by mouth daily. What changed:  when to take this   multivitamin with minerals Tabs tablet Take 1 tablet by mouth daily.   nitroGLYCERIN 0.4 MG SL tablet Commonly known as:  NITROSTAT Take 0.4 mg under the toungue as needed for chest pain   rosuvastatin 10 MG tablet Commonly known as:  CRESTOR Take 10 mg by mouth daily.   vitamin C 500 MG tablet Commonly known as:  ASCORBIC ACID Take 1,000 mg by mouth at bedtime.   VITAMIN D-1000 MAX ST 1000 units tablet Generic drug:  Cholecalciferol Take 1,000 Units by mouth daily.   zinc sulfate 220 (50 Zn) MG capsule Take 1 capsule (220 mg total) by mouth daily.       Discharge Instructions: Please refer to Patient Instructions section of EMR for full details.  Patient was counseled important signs and symptoms that should prompt return to medical care, changes in medications, dietary instructions, activity restrictions, and follow up appointments.   Follow-Up Appointments: Follow-up Information    Dillingham, Alena Bills, DO Follow up in 1 week(s).   Specialty:  Plastic Surgery Contact information: [address] [place] [address] [phone]        [facility] - Dme Follow up.   Why:  O2 and RW with elevated platform arranged- to be delivered to room prior to discharge Contact information: [address] [place] [address] [phone]        [facility] Follow up.   Specialty:  Rehabilitation Why:  Referral sent for oupt PT/OT- they will contact you regarding referral- or you may f/u if you have not heard from them within one week of discharge.  Contact information: [address] [id number] mc [address] [place] [address] [phone]       [name], MD. Go on 08/09/2017.   Specialty:  Family Medicine Why:  9am for hospital follow up Contact  information: [address] [place] [address] [phone]           Garnette Gunner, MD 07/31/2017, 4:35 PM PGY-1, [facility]

## 2017-07-24 NOTE — Progress Notes (Signed)
Pharmacy Antibiotic Note  Sylvia Craig is a 66 y.o. female admitted on 07/24/2017 with sepsis.  Pharmacy has been consulted for Zosyn and vancomycin dosing.  Starting on abx for L hanf infection. Had a mechanical fall and landed on her hand recently which caused a skin tear. Then the hand became progressively more swollen and red. WBC 14.0. Ordered one time doses of abx in the ED. SCr 1.5, normalized CrCl ~140ml/min.  Plan: Continue Zosyn 3.375 gm IV q8h (4 hour infusion) Start vancomycin 1.5g IV Q24h tomorrow Monitor clinical picture, renal function, VT prn F/U C&S, abx deescalation / LOT   Height: 5\' 5"  (165.1 cm) Weight: 267 lb (121.1 kg) IBW/kg (Calculated) : 57  No data recorded.  No results for input(s): WBC, CREATININE, LATICACIDVEN, VANCOTROUGH, VANCOPEAK, VANCORANDOM, GENTTROUGH, GENTPEAK, GENTRANDOM, TOBRATROUGH, TOBRAPEAK, TOBRARND, AMIKACINPEAK, AMIKACINTROU, AMIKACIN in the last 168 hours.  CrCl cannot be calculated (No order found.).    Allergies  Allergen Reactions  . Shellfish-Derived Products Shortness Of Breath  . Codeine Other (See Comments)    Rash, edema  . Atorvastatin Other (See Comments)    Myalgias   . Lisinopril Other (See Comments)    Cough   . Other Other (See Comments)    Crab, chest tightness  . Oxycodone-Aspirin Other (See Comments)    Rash, dreams    Antimicrobials this admission: Zosyn 7/29 >>  Vancomycin 7/29 >>   Dose adjustments this admission: n/a  Microbiology results: 7/28 BCx: sent  Thank you for allowing pharmacy to be a part of this patient's care.  Enzo BiNathan Nawaf Strange, PharmD, BCPS Clinical Pharmacist Pager [phone] 07/24/2017 2:01 PM

## 2017-07-24 NOTE — ED Provider Notes (Signed)
[facility]-EMERGENCY DEPT Provider Note   CSN: [id number] Arrival date & time: 07/24/17  1259     History   Chief Complaint Chief Complaint  Patient presents with  . Hand Injury    HPI Sylvia NightDebbie Hildebrant is a 66 y.o. female history diabetes, hypertension here presenting with left hand infection. She had a mechanical fall in the kitchen and landed on her left hand. Patient states that there was a skin tear and then progressively become more swollen and red. She also started noticing discharge from the wound. Patient also noticed that her blood sugar has been running elevated around 300. Denies any fevers or chills and unclear when her last tetanus was. She was retired Scientist, forensichealth care worker.  She is on eliquis for afib.   The history is provided by the patient.    Past Medical History:  Diagnosis Date  . CHF (congestive heart failure) (HCC)   . Diabetes mellitus without complication (HCC)   . Hypertension   . Iron (Fe) deficiency anemia   . Thyroid disease     There are no active problems to display for this patient.   History reviewed. No pertinent surgical history.  OB History    No data available       Home Medications    Prior to Admission medications   Medication Sig Start Date End Date Taking? Authorizing Provider  acetaminophen (TYLENOL) 500 MG tablet Take 1,000 mg by mouth every 6 (six) hours as needed for moderate pain or headache.    Yes [provider]  apixaban (ELIQUIS) 2.5 MG TABS tablet TAKE 2.5 MG BY MOUTH 2 TIMES A DAY 07/16/15  Yes [provider]  Cholecalciferol (VITAMIN D-1000 MAX ST) 1000 units tablet Take 1,000 Units by mouth daily.    Yes [provider]  ferrous sulfate 325 (65 FE) MG tablet Take 325 mg by mouth 2 (two) times daily with a meal.   Yes [provider]  furosemide (LASIX) 40 MG tablet Take 40 mg by mouth 2 (two) times daily.  06/11/16  Yes [provider]  glimepiride (AMARYL) 4 MG tablet Take 4 mg by  mouth 2 (two) times daily.    Yes [provider]  insulin glargine (LANTUS) 100 UNIT/ML injection Inject 26 Units into the skin at bedtime.   Yes [provider]  insulin lispro (HUMALOG KWIKPEN) 100 UNIT/ML KiwkPen 6 Units SQ 3 (three) times daily with meals. 06/16/16  Yes [provider]  levothyroxine (SYNTHROID, LEVOTHROID) 137 MCG tablet Take 137 mcg by mouth daily before breakfast.    Yes [provider]  losartan (COZAAR) 25 MG tablet Take 25 mg by mouth daily.  10/31/14  Yes [provider]  metoprolol succinate (TOPROL-XL) 100 MG 24 hr tablet Take 100 mg by mouth 2 (two) times daily.  04/15/15  Yes [provider]  nitroGLYCERIN (NITROSTAT) 0.4 MG SL tablet Take 0.4 mg under the toungue as needed for chest pain 12/18/14  Yes [provider]  rosuvastatin (CRESTOR) 10 MG tablet Take 10 mg by mouth daily.  06/04/16  Yes [provider]  vitamin C (ASCORBIC ACID) 500 MG tablet Take 1,000 mg by mouth at bedtime.    Yes [provider]    Family History No family history on file.  Social History Social History  Substance Use Topics  . Smoking status: Unknown If Ever Smoked  . Smokeless tobacco: Never Used  . Alcohol use No     Allergies  Shellfish-derived products; Codeine; Atorvastatin; Lisinopril; Other; and Oxycodone-aspirin   Review of Systems Review of Systems  Skin: Positive for wound.  All other systems reviewed and are negative.    Physical Exam Updated Vital Signs BP 110/63   Pulse 76   Resp 17   Ht 5\' 5"  (1.651 m)   Wt 121.1 kg (267 lb)   SpO2 99%   BMI 44.43 kg/m   Physical Exam  Constitutional: She is oriented to person, place, and time.  Uncomfortable   HENT:  Head: Normocephalic.  Mouth/Throat: Oropharynx is clear and moist.  Eyes: Pupils are equal, round, and reactive to light. Conjunctivae and EOM are normal.  Neck: Normal range of motion. Neck supple.    Cardiovascular: Normal rate, regular rhythm and normal heart sounds.   Pulmonary/Chest: Effort normal and breath sounds normal. No respiratory distress. She has no wheezes.  Abdominal: Soft. Bowel sounds are normal. She exhibits no distension. There is no tenderness.  Musculoskeletal: Normal range of motion.  L dorsum of the hand with obvious purulent discharge with surrounding erythema. No obvious tendons exposed. Some tracking up the arm   Neurological: She is alert and oriented to person, place, and time. No cranial nerve deficit. Coordination normal.  Skin: Skin is warm. There is erythema.  Psychiatric: She has a normal mood and affect.  Nursing note and vitals reviewed.      ED Treatments / Results  Labs (all labs ordered are listed, but only abnormal results are displayed) Labs Reviewed  CBC WITH DIFFERENTIAL/PLATELET - Abnormal; Notable for the following:       Result Value   WBC 14.0 (*)    Neutro Abs 11.6 (*)    All other components within normal limits  BASIC METABOLIC PANEL - Abnormal; Notable for the following:    Glucose, Bld 274 (*)    BUN 40 (*)    Creatinine, Ser 1.50 (*)    GFR calc non Af Amer 35 (*)    GFR calc Af Amer 40 (*)    All other components within normal limits  CBG MONITORING, ED - Abnormal; Notable for the following:    Glucose-Capillary 250 (*)    All other components within normal limits  CULTURE, BLOOD (ROUTINE X 2)  CULTURE, BLOOD (ROUTINE X 2)  URINALYSIS, ROUTINE W REFLEX MICROSCOPIC  I-STAT CG4 LACTIC ACID, ED    EKG  EKG Interpretation None       Radiology No results found.  Procedures Procedures (including critical care time)  Angiocath insertion Performed by: Richardean Canal  Consent: Verbal consent obtained. Risks and benefits: risks, benefits and alternatives were discussed Time out: Immediately prior to procedure a "time out" was called to verify the correct patient, procedure, equipment, support staff and site/side  marked as required.  Preparation: Patient was prepped and draped in the usual sterile fashion.  Vein Location: R antecube  Ultrasound Guided  Gauge: 20 long   Normal blood return and flush without difficulty Patient tolerance: Patient tolerated the procedure well with no immediate complications.      Medications Ordered in ED Medications  sodium chloride 0.9 % bolus 1,000 mL (1,000 mLs Intravenous New Bag/Given 07/24/17 1412)    And  sodium chloride 0.9 % bolus 1,000 mL (1,000 mLs Intravenous New Bag/Given 07/24/17 1445)    And  sodium chloride 0.9 % bolus 1,000 mL (not administered)    And  sodium chloride 0.9 % bolus 1,000 mL (not administered)  piperacillin-tazobactam (ZOSYN) IVPB 3.375 g (  3.375 g Intravenous New Bag/Given 07/24/17 1425)  Tdap (BOOSTRIX) injection 0.5 mL (not administered)  vancomycin (VANCOCIN) 2,000 mg in sodium chloride 0.9 % 500 mL IVPB (2,000 mg Intravenous New Bag/Given 07/24/17 1414)  piperacillin-tazobactam (ZOSYN) IVPB 3.375 g (not administered)  vancomycin (VANCOCIN) 1,500 mg in sodium chloride 0.9 % 500 mL IVPB (not administered)     Initial Impression / Assessment and Plan / ED Course  I have reviewed the triage vital signs and the nursing notes.  Pertinent labs & imaging results that were available during my care of the patient were reviewed by me and considered in my medical decision making (see chart for details).     Sylvia Craig is a [age] y.o. female here with L hand infection s/p fall. Will get xray to r/o osteo vs foreign body. Tachycardic, obvious source of infection. Will activate code sepsis. Will give vanc/zosyn empirically.   2:52 PM WBC 14. I called Dr. Eulah PontMurphy from hand, who will see patient and take her to OR for debridement. Recommend medicine admission for IV abx.   Final Clinical Impressions(s) / ED Diagnoses   Final diagnoses:  None    New Prescriptions New Prescriptions   No medications on file     Charlynne PanderYao, Javeah Loeza  Hsienta, MD 07/24/17 1452

## 2017-07-24 NOTE — ED Notes (Signed)
Admitting MD Chanetta Marshallimberlake gave verbal discontinuation of following boluses due to ejection fraction. MD made aware that patient has only received 1500ml

## 2017-07-24 NOTE — Progress Notes (Signed)
Late entry.  pt developed respiratory distress, O2 sat dropped to 60's, lip turned to blue, changed non-rebrether mask, notified MD, stat PCXR done, EKG done, atrovent neb. Given @ 2050, Lasix 40mg  IV given @ 2111, 16Fr. F-cath inserted @ 2140, 275cc cloudy yellow urine obtained.  requested step down bed,  Son Visit now.

## 2017-07-24 NOTE — Progress Notes (Signed)
Cell phone handed to Pt, denture cup in the pt's belonging bag, notified RN Ndeye. Notified pharmacy regarding Ancef 2g IV given per Q6hrs order, not Q8hours. Pharmacist will take care of it.

## 2017-07-24 NOTE — Transfer of Care (Signed)
Immediate Anesthesia Transfer of Care Note  Patient: Sylvia Craig  Procedure(s) Performed: Procedure(s): IRRIGATION AND DEBRIDEMENT EXTREMITY (Left)  Patient Location: PACU  Anesthesia Type:General  Level of Consciousness: awake, alert  and oriented  Airway & Oxygen Therapy: Patient Spontanous Breathing and Patient connected to nasal cannula oxygen  Post-op Assessment: Report given to RN and Post -op Vital signs reviewed and stable  Post vital signs: Reviewed and stable  Last Vitals:  Vitals:   07/24/17 1709 07/24/17 1730  BP: 126/67 (!) 145/58  Pulse: 85 (!) 102  Resp: 18 18    Last Pain:  Vitals:   07/24/17 1308  TempSrc:   PainSc: 1          Complications: No apparent anesthesia complications

## 2017-07-24 NOTE — Op Note (Addendum)
07/24/2017  7:18 PM  PATIENT:  Sylvia Craig    PRE-OPERATIVE DIAGNOSIS:  INFECTED LEFT HAND  POST-OPERATIVE DIAGNOSIS:  Same  PROCEDURE:  IRRIGATION AND DEBRIDEMENT EXTREMITY  SURGEON:  Hazelene Doten D, MD  ASSISTANT: none  ANESTHESIA:   gen  PREOPERATIVE INDICATIONS:  Sylvia Craig is a  66 y.o. female with a diagnosis of INFECTED LEFT HAND who failed conservative measures and elected for surgical management.    The risks benefits and alternatives were discussed with the patient preoperatively including but not limited to the risks of infection, bleeding, nerve injury, cardiopulmonary complications, the need for revision surgery, among others, and the patient was willing to proceed.  OPERATIVE IMPLANTS: none  OPERATIVE FINDINGS: purulence and necrotic tissue, exposed extensor tendons  BLOOD LOSS: min  COMPLICATIONS: none  OPERATIVE PROCEDURE:  Patient was identified in the preoperative holding area and site was marked by me She was transported to the operating theater and placed on the table in supine position taking care to pad all bony prominences. After a preincinduction time out anesthesia was induced. The left upper extremity was prepped and draped in normal sterile fashion and a pre-incision timeout was performed. She received ancef after culture for preoperative antibiotics.   Necrotic tissue February over the dorsum of her hand with minimal debridement. I performed an excisional debridement of this necrotic tissue.  She did have exposed extensor tendons and her wound. I performed a tenolysis of 4 extensor tendons. These were the extensor tendons to her extensor digitorum comminus to her index, long and ring fingers as well as her extensor indicis tendon  I did perform an excisional debridement of some deep necrotic tissue I confirmed all pockets of purulent had been expressed I then performed a thorough irrigation of her wound.  I then placed a sterile dressing she was  awoken and taken the PACU in stable condition  POST OPERATIVE PLAN: Wound care.  IV antibiotics and DVT prophylaxis per primary team

## 2017-07-24 NOTE — ED Triage Notes (Signed)
Pt. Stated, I fell and hurt my left hand and now it infected. I slipped in my kitchen.  Left hand bandaged with wrap and ace wrap.

## 2017-07-24 NOTE — H&P (Addendum)
[facility] Admission History and Physical Service Pager: [phone]  Patient name: Sylvia Craig Medical record number: [id number] Date of birth: 19-Oct-1951 Age: 66 y.o. Gender: female  Primary Care Provider: System, Pcp Not In Consultants: hand surgery Code Status: FULL confirmed on admission  Chief Complaint: left hand infection   Assessment and Plan: Sylvia Craig is a 67 y.o. female presenting with left hand infection 2/2 mechanical fall 10 days ago. PMH is significant for T2DM, HTN, HLD, morbid obesity, vitamin D deficiency, CKD stage 3, iron deficiency anemia, [name] s/p triple vessel cath in 2015 with no stents, CHF NYHA class 2, a fib, and hypothyroidism.    Left hand infection  2/2 mechanical fall. Patient had laceration to left hand following fall which has progressively gotten worse. Patient now reports purulent drainage. Hand surgery has been consulted, will plan for OR today for debridement. Afebrile. Mild leukocytosis of 14.0. Xray of hand showing no acute osseous abnormality, and soft tissue injury overlying the dorsal aspect of the hand. CXR showing cardiomegaly, and no acute cardiopulmonary process with no fractures (patient reports hitting left chest in fall). Patient given IV vancomycin and IV Zosyn in ED with Code Sepsis order set. Will narrow to ancef given less likely anaerobe or MRSA.  -admit to observation, attending Dr. Andria Frames  -appreciate surgery recommendations  -blood cultures pending -discontinue vancomycin and zosyn, change to cefazolin 2 g IV  -Have held Eliquis today, will likely need to restart tomorrow -lactic acid, CBC, BMP -continuous pulse ox   -tylenol 1000 mg q6hrs prn pain   T2DM Current CBG of 250. Hemoglobin A1C of 8.0 on 09/16/2016. Home humalog 6 units TID with meals, Amaryl 61m BID, lantus 26 units daily.  -repeat A1C -monitor CBGs ACHS -lantus 16 units at bedtime -moderate SSI  HTN BP of 141/77 despite sepsis and  considering she is clinically well appearing. Home meds are toprol XL 1042m cozaar 25 mg. Will dose metoprolol 5091mID to avoid rebound tachycardia as BP can tolerate.  -metoprolol immediate release 50 mg bid -continue to monitor   HFpEF Echo 2015 showing EF 45%, Cath 2015 showing EF 35%. Weight on 06/28/2016 266 lb 6.4 oz. Weight on admission of 267 lbs. [facility] lasix 40 mg BID and losartan 25 mg. Recommended by cardiologist for low sodium diet with 2 L fluid restriction. Patient initially ordered for 46m56m NS boluses, but upon discovery of reduced EF, patient had received 2L NS boluses and additional boluses were discontinud. -continue lasix 40 mg po to start 7/30, no signs of clinical volume overload -monitor fluid status -I's and O's  Atrial fibrillation, chronic On home Eliquis 2.5 mg and metoprolol. Unable to appreciate why Eliquis is dosed at 2.5mg 53m (per pharmacy patient would have to have 2 of 3: age >80, >74>1.5, and <100kg). Patient has one of these criteria (Cr >1.5 in recent past). Patient self discontinued Eliquis since the fall on 7/19.  -Have held Elliquis today, will likely need to restart tomorrow -discuss with patient Eliquis dosing  HLD Home crestor 10 mg -continue home crestor  CKD stage 3 Current Cr 1.50. Per chart review baseline ~1.4.  -continue to monitor   Iron deficiency anemia  Current CBC wnl showing Hgb 13.8, Hct 43.0, MCV 94.7. Home iron supplementation.  -no supplementation during acute illness  [name]  Home nitro prn, metoprolol, and crestor. Patient denies current CP.    -continue nitro prn  Hypothyroidism  Home synthroid  137 mcg -continue home synthroid   FEN/GI: NPO Prophylaxis: lovenox 40 mg   Disposition: admit to observation   History of Present Illness:  Sylvia Craig is a 66 y.o. female presenting with left hand infection 2/2 mechanical fall 10 days ago. PMHx significant for T2DM, HTN, HFpEF, HLD, morbid  obesity, vitamin D deficiency, CKD stage 3, iron deficiency anemia, [name] s/p triple vessel cath in 2015 with no stents, CHF NYHA class 2, a fib, and hypothyroidism. Patient fell on 7/19 due to a change in floor height from hallway to kitchen. Patient tried to catch herself and hit her hand of the hinge of the cabinet. Patient did not seek medical attention post fall and bandaged it up on her own. Patient acknowledges that she probably should have been seen then. Initially had laceration of left hand which has progressively gotten worse. Patient states hand has not formed purulent drainage and is more swollen. Patient denies redness. Patient did not apply any OTC antibiotic creams to the wound and has just been using bandages. Used a rice sock on her arm, not directly over the wound, for pain along with prn tylenol. Patient is currently taking Eliquis for a history of A fib but has not been taking since fall for fear of bleeding. Since fall patient has noticed that her fingers on left hand are mildly swollen and has been keeping left hand elevated. Patient endorses tingly sensation in left hand, but states she has kept it tightly bandaged so she wouldn't have to look at it. Did not change the bandage x >1 week. Denies fevers and chills, N/V/D, CP, HA. Patient endorses some SOB with pain over her L ribs 2/2 fall. Patient does not think she hit her head and denies LOC. Since fall, CBGs have been labile and patient states she has not been eating regular meals, due to inability to cook. Patient did not take lasix, crestor, vit C, or iron on day of admission. On admission patient is afebrile, vitals wnl but elevated BP.    In the ED, patient met Sepsis criteria due to tachycardia and WBC 14. Code Sepsis initiated with 44m/kg NS boluses and vanc/zosyn. Lactic acid normal. Blood cultures drawn. Hand surgery was consulted, who recommended OR debridement today given patient's NPO status.  Review Of Systems: Per HPI with  the following additions:   Review of Systems  Constitutional: Negative for chills and fever.  Respiratory: Negative for shortness of breath.   Cardiovascular: Negative for chest pain.  Gastrointestinal: Negative for diarrhea, nausea and vomiting.  Genitourinary: Negative.   Neurological: Negative for dizziness and headaches.    Patient Active Problem List   Diagnosis Date Noted  . Cellulitis 07/24/2017    Past Medical History: Past Medical History:  Diagnosis Date  . CHF (congestive heart failure) ([place]   . Diabetes mellitus without complication ([place]   . Hypertension   . Iron (Fe) deficiency anemia   . Thyroid disease     Past Surgical History: History reviewed. No pertinent surgical history.  Social History: Social History  Substance Use Topics  . Smoking status: Unknown If Ever Smoked  . Smokeless tobacco: Never Used  . Alcohol use No   Additional social history: denies tobacco, alcohol, or drug use.  Patient lives with her son and 268year old grandson.  Please also refer to relevant sections of EMR.  Family History: History reviewed. No pertinent family history.  Allergies and Medications: Allergies  Allergen Reactions  . Shellfish-Derived Products Shortness Of  Breath  . Codeine Other (See Comments)    Rash, edema  . Atorvastatin Other (See Comments)    Myalgias   . Lisinopril Other (See Comments)    Cough   . Other Other (See Comments)    Crab, chest tightness  . Oxycodone-Aspirin Other (See Comments)    Rash, dreams   No current facility-administered medications on file prior to encounter.    Current Outpatient Prescriptions on File Prior to Encounter  Medication Sig Dispense Refill  . acetaminophen (TYLENOL) 500 MG tablet Take 1,000 mg by mouth every 6 (six) hours as needed for moderate pain or headache.     Marland Kitchen apixaban (ELIQUIS) 2.5 MG TABS tablet TAKE 2.5 MG BY MOUTH 2 TIMES A DAY    . Cholecalciferol (VITAMIN D-1000 MAX ST) 1000 units tablet  Take 1,000 Units by mouth daily.     . furosemide (LASIX) 40 MG tablet Take 40 mg by mouth 2 (two) times daily.     Marland Kitchen glimepiride (AMARYL) 4 MG tablet Take 4 mg by mouth 2 (two) times daily.     . insulin lispro (HUMALOG KWIKPEN) 100 UNIT/ML KiwkPen 6 Units SQ 3 (three) times daily with meals.    Marland Kitchen levothyroxine (SYNTHROID, LEVOTHROID) 137 MCG tablet Take 137 mcg by mouth daily before breakfast.     . losartan (COZAAR) 25 MG tablet Take 25 mg by mouth daily.     . metoprolol succinate (TOPROL-XL) 100 MG 24 hr tablet Take 100 mg by mouth 2 (two) times daily.     . nitroGLYCERIN (NITROSTAT) 0.4 MG SL tablet Take 0.4 mg under the toungue as needed for chest pain    . rosuvastatin (CRESTOR) 10 MG tablet Take 10 mg by mouth daily.     . vitamin C (ASCORBIC ACID) 500 MG tablet Take 1,000 mg by mouth at bedtime.       Objective: BP (!) 145/58   Pulse (!) 102   Resp 18   Ht _0  (1.651 m)   Wt 267 lb (121.1 kg)   SpO2 100%   BMI 44.43 kg/m  Exam: General: awake and alert, lying in bed, NAD Eyes: PERRL, EOMI, no scleral icterus  ENTM: moist mucous membranes  Neck: supple  Cardiovascular: RRR, no MRG Respiratory: CTAB, no wheezes, rales, or rhonchi  Gastrointestinal: soft, non tender, non distended, bowel sounds x 4 MSK: non tender, 1+ pitting edema in legs bilaterally, erythema to legs bilaterally Derm: skin breakage of left hand, per picture in ED showing purulent drainage, bandaged on exam  Neuro: no focal deficit  Psych: normal affect   Labs and Imaging: CBC BMET   Recent Labs Lab 07/24/17 1313  WBC 14.0*  HGB 13.8  HCT 43.0  PLT 270    Recent Labs Lab 07/24/17 1313  NA 137  K 4.9  CL 101  CO2 26  BUN 40*  CREATININE 1.50*  GLUCOSE 274*  CALCIUM 9.7     Lactic Acid, Venous    Component Value Date/Time   LATICACIDVEN 1.33 07/24/2017 1429   Dg Chest 2 View  Result Date: 07/24/2017 CLINICAL DATA:  Patient status post fall. EXAM: CHEST  2 VIEW COMPARISON:   None. FINDINGS: Monitoring leads overlie the patient. Cardiomegaly. No consolidative pulmonary opacities. No pleural effusion or pneumothorax. Thoracic spine degenerative changes. IMPRESSION: Cardiomegaly.  No acute cardiopulmonary process. Electronically Signed   By: Lovey Newcomer M.D.   On: 07/24/2017 16:58   Dg Hand Complete Left  Result Date: 07/24/2017 CLINICAL DATA:  Patient status post fall. Left hand wound around the posterior aspect of the carpal bones. Initial encounter. EXAM: LEFT HAND - COMPLETE 3+ VIEW COMPARISON:  None. FINDINGS: Soft tissue defect overlying the dorsal aspect of the hand. Vascular calcifications. Normal anatomic alignment. No evidence for acute fracture or dislocation. IMPRESSION: No acute osseous abnormality. Soft tissue injury overlying the dorsal aspect of the hand. Electronically Signed   By: Lovey Newcomer M.D.   On: 07/24/2017 16:58     Caroline More, DO 07/24/2017, 6:54 PM PGY-1, [place] Intern pager: [phone], text pages welcome  FPTS Upper-Level Resident Addendum  I have independently interviewed and examined the patient. I have discussed the above with the original author and agree with their documentation. My edits for correction/addition/clarification are in blue. Please see also any attending notes.   Ralene Ok, MD PGY-2, [place] Service pager: [phone] (text pages welcome through [facility])

## 2017-07-24 NOTE — Anesthesia Procedure Notes (Signed)
Procedure Name: Intubation Date/Time: 07/24/2017 7:08 PM Performed by: Manuela Schwartz B Pre-anesthesia Checklist: Patient identified, Emergency Drugs available, Suction available and Patient being monitored Patient Re-evaluated:Patient Re-evaluated prior to induction Oxygen Delivery Method: Circle System Utilized Preoxygenation: Pre-oxygenation with 100% oxygen Induction Type: IV induction Laryngoscope Size: Mac and 3 Grade View: Grade I Tube type: Oral Tube size: 7.5 mm Number of attempts: 1 Airway Equipment and Method: Stylet Placement Confirmation: ETT inserted through vocal cords under direct vision,  positive ETCO2 and breath sounds checked- equal and bilateral Secured at: 21 cm Tube secured with: Tape Dental Injury: Teeth and Oropharynx as per pre-operative assessment  Comments: Unable to maintain adequate ventilation with LMA, exchanged to ETT without difficulty

## 2017-07-25 ENCOUNTER — Encounter (HOSPITAL_COMMUNITY): Payer: Self-pay | Admitting: Orthopedic Surgery

## 2017-07-25 DIAGNOSIS — E1110 Type 2 diabetes mellitus with ketoacidosis without coma: Secondary | ICD-10-CM

## 2017-07-25 DIAGNOSIS — I5032 Chronic diastolic (congestive) heart failure: Secondary | ICD-10-CM

## 2017-07-25 DIAGNOSIS — R0902 Hypoxemia: Secondary | ICD-10-CM | POA: Diagnosis present

## 2017-07-25 DIAGNOSIS — Z6841 Body Mass Index (BMI) 40.0 and over, adult: Secondary | ICD-10-CM | POA: Diagnosis not present

## 2017-07-25 DIAGNOSIS — L089 Local infection of the skin and subcutaneous tissue, unspecified: Secondary | ICD-10-CM | POA: Diagnosis present

## 2017-07-25 DIAGNOSIS — W1830XA Fall on same level, unspecified, initial encounter: Secondary | ICD-10-CM | POA: Diagnosis not present

## 2017-07-25 DIAGNOSIS — Y92000 Kitchen of unspecified non-institutional (private) residence as  the place of occurrence of the external cause: Secondary | ICD-10-CM | POA: Diagnosis not present

## 2017-07-25 DIAGNOSIS — Z23 Encounter for immunization: Secondary | ICD-10-CM | POA: Diagnosis present

## 2017-07-25 DIAGNOSIS — E039 Hypothyroidism, unspecified: Secondary | ICD-10-CM | POA: Diagnosis present

## 2017-07-25 DIAGNOSIS — E8779 Other fluid overload: Secondary | ICD-10-CM | POA: Diagnosis present

## 2017-07-25 DIAGNOSIS — D509 Iron deficiency anemia, unspecified: Secondary | ICD-10-CM | POA: Diagnosis present

## 2017-07-25 DIAGNOSIS — S2002XA Contusion of left breast, initial encounter: Secondary | ICD-10-CM | POA: Diagnosis present

## 2017-07-25 DIAGNOSIS — J9811 Atelectasis: Secondary | ICD-10-CM | POA: Diagnosis not present

## 2017-07-25 DIAGNOSIS — N183 Chronic kidney disease, stage 3 (moderate): Secondary | ICD-10-CM | POA: Diagnosis present

## 2017-07-25 DIAGNOSIS — Z7901 Long term (current) use of anticoagulants: Secondary | ICD-10-CM | POA: Diagnosis not present

## 2017-07-25 DIAGNOSIS — L03114 Cellulitis of left upper limb: Secondary | ICD-10-CM | POA: Diagnosis present

## 2017-07-25 DIAGNOSIS — R000 Tachycardia, unspecified: Secondary | ICD-10-CM | POA: Diagnosis present

## 2017-07-25 DIAGNOSIS — E662 Morbid (severe) obesity with alveolar hypoventilation: Secondary | ICD-10-CM | POA: Diagnosis present

## 2017-07-25 DIAGNOSIS — R0603 Acute respiratory distress: Secondary | ICD-10-CM | POA: Diagnosis present

## 2017-07-25 DIAGNOSIS — L03119 Cellulitis of unspecified part of limb: Secondary | ICD-10-CM | POA: Diagnosis present

## 2017-07-25 DIAGNOSIS — S61412A Laceration without foreign body of left hand, initial encounter: Secondary | ICD-10-CM | POA: Diagnosis present

## 2017-07-25 DIAGNOSIS — I482 Chronic atrial fibrillation: Secondary | ICD-10-CM | POA: Diagnosis present

## 2017-07-25 DIAGNOSIS — S61409A Unspecified open wound of unspecified hand, initial encounter: Secondary | ICD-10-CM | POA: Diagnosis present

## 2017-07-25 DIAGNOSIS — Z87891 Personal history of nicotine dependence: Secondary | ICD-10-CM | POA: Diagnosis not present

## 2017-07-25 DIAGNOSIS — E785 Hyperlipidemia, unspecified: Secondary | ICD-10-CM | POA: Diagnosis present

## 2017-07-25 DIAGNOSIS — E559 Vitamin D deficiency, unspecified: Secondary | ICD-10-CM | POA: Diagnosis present

## 2017-07-25 DIAGNOSIS — G4733 Obstructive sleep apnea (adult) (pediatric): Secondary | ICD-10-CM | POA: Diagnosis not present

## 2017-07-25 DIAGNOSIS — I130 Hypertensive heart and chronic kidney disease with heart failure and stage 1 through stage 4 chronic kidney disease, or unspecified chronic kidney disease: Secondary | ICD-10-CM | POA: Diagnosis present

## 2017-07-25 DIAGNOSIS — I2510 Atherosclerotic heart disease of native coronary artery without angina pectoris: Secondary | ICD-10-CM | POA: Diagnosis present

## 2017-07-25 DIAGNOSIS — S61402D Unspecified open wound of left hand, subsequent encounter: Secondary | ICD-10-CM | POA: Diagnosis not present

## 2017-07-25 DIAGNOSIS — Z794 Long term (current) use of insulin: Secondary | ICD-10-CM | POA: Diagnosis not present

## 2017-07-25 DIAGNOSIS — E1122 Type 2 diabetes mellitus with diabetic chronic kidney disease: Secondary | ICD-10-CM | POA: Diagnosis present

## 2017-07-25 LAB — CBC
HEMATOCRIT: 39.2 % (ref 36.0–46.0)
HEMOGLOBIN: 12.5 g/dL (ref 12.0–15.0)
MCH: 31 pg (ref 26.0–34.0)
MCHC: 31.9 g/dL (ref 30.0–36.0)
MCV: 97.3 fL (ref 78.0–100.0)
Platelets: 245 10*3/uL (ref 150–400)
RBC: 4.03 MIL/uL (ref 3.87–5.11)
RDW: 13.5 % (ref 11.5–15.5)
WBC: 12.2 10*3/uL — ABNORMAL HIGH (ref 4.0–10.5)

## 2017-07-25 LAB — BASIC METABOLIC PANEL
ANION GAP: 8 (ref 5–15)
BUN: 35 mg/dL — ABNORMAL HIGH (ref 6–20)
CO2: 25 mmol/L (ref 22–32)
Calcium: 9 mg/dL (ref 8.9–10.3)
Chloride: 105 mmol/L (ref 101–111)
Creatinine, Ser: 1.43 mg/dL — ABNORMAL HIGH (ref 0.44–1.00)
GFR, EST AFRICAN AMERICAN: 43 mL/min — AB (ref >60)
GFR, EST NON AFRICAN AMERICAN: 37 mL/min — AB (ref >60)
GLUCOSE: 138 mg/dL — AB (ref 65–99)
POTASSIUM: 5.1 mmol/L (ref 3.5–5.1)
Sodium: 138 mmol/L (ref 135–145)

## 2017-07-25 LAB — GLUCOSE, CAPILLARY
GLUCOSE-CAPILLARY: 140 mg/dL — AB (ref 65–99)
GLUCOSE-CAPILLARY: 243 mg/dL — AB (ref 65–99)
Glucose-Capillary: 190 mg/dL — ABNORMAL HIGH (ref 65–99)
Glucose-Capillary: 229 mg/dL — ABNORMAL HIGH (ref 65–99)

## 2017-07-25 MED ORDER — ORAL CARE MOUTH RINSE
15.0000 mL | Freq: Two times a day (BID) | OROMUCOSAL | Status: DC
  Administered 2017-07-26 – 2017-07-29 (×5): 15 mL via OROMUCOSAL

## 2017-07-25 MED ORDER — CEFAZOLIN SODIUM-DEXTROSE 2-4 GM/100ML-% IV SOLN
2.0000 g | Freq: Four times a day (QID) | INTRAVENOUS | Status: DC
  Filled 2017-07-25: qty 100

## 2017-07-25 MED ORDER — APIXABAN 2.5 MG PO TABS
2.5000 mg | ORAL_TABLET | Freq: Two times a day (BID) | ORAL | Status: DC
  Administered 2017-07-25 – 2017-07-28 (×7): 2.5 mg via ORAL
  Filled 2017-07-25 (×7): qty 1

## 2017-07-25 MED ORDER — CEFAZOLIN SODIUM-DEXTROSE 2-4 GM/100ML-% IV SOLN
2.0000 g | Freq: Three times a day (TID) | INTRAVENOUS | Status: DC
  Administered 2017-07-25 – 2017-07-29 (×12): 2 g via INTRAVENOUS
  Filled 2017-07-25 (×13): qty 100

## 2017-07-25 NOTE — Progress Notes (Signed)
SATURATION QUALIFICATIONS: (This note is used to comply with regulatory documentation for home oxygen)  Patient Saturations on Room Air at Rest = 86%%  Patient Saturations on Room Air while Ambulating =86%%  Patient Saturations on 2 Liters of oxygen while Ambulating = 93-97%  Please briefly explain why patient needs home oxygen:  Pt requires supplemental 02 to maintain 02 sats > 90% to allow for improved ability to perform ADLs and mobility safely.    Jeani HawkingWendi Jamella Grayer, OTR/L  [phone]

## 2017-07-25 NOTE — Evaluation (Signed)
Occupational Therapy Evaluation Patient Details Name: Sylvia Craig MRN: [id number] DOB: May 14, 1951 Today's Date: 07/25/2017    History of Present Illness This 66 y.o. female admitted with Lt hand infection secondary to soft tissue injury that occurred during a fall ~10 days PTA.  She underwent debridement of Lt hand by ortho on 7/29.  PMH includes:   CHF, DM, HTN   Clinical Impression   Pt admitted with above. She demonstrates the below listed deficits and will benefit from continued OT to maximize safety and independence with BADLs.  Pt presents to OT with generalized weakness, decreased activity tolerance, impaired balance, decreased ROM and functional use of Lt UE.  02 sats decreased to 86% on RA - remained >93% on 2L supplemental 02.  She lives with son who can provide support.  Pt would benefit from PT consult.  Will follow.       Follow Up Recommendations  Outpatient OT;Supervision/Assistance - 24 hour    Equipment Recommendations  3 in 1 bedside commode;Other (comment) (possible RW )    Recommendations for Other Services  Physical Therapy.       Precautions / Restrictions Precautions Precautions: Fall Precaution Comments: Pt denies other falls except for the one that led to her hand injury       Mobility Bed Mobility Overal bed mobility: Needs Assistance Bed Mobility: Supine to Sit     Supine to sit: Min assist     General bed mobility comments: Pt with heavy reliance on rail.  Min A to lift shoulders   Transfers Overall transfer level: Needs assistance Equipment used: None Transfers: Sit to/from UGI CorporationStand;Stand Pivot Transfers Sit to Stand: Min assist Stand pivot transfers: Min guard       General transfer comment: Min A to lift buttocks, and min guard assist for safety     Balance Overall balance assessment: Needs assistance Sitting-balance support: Feet supported Sitting balance-Leahy Scale: Fair     Standing balance support: During functional activity;No  upper extremity supported Standing balance-Leahy Scale: Fair Standing balance comment: static standing with min guard assist.  Min A for dynamic standing                            ADL either performed or assessed with clinical judgement   ADL Overall ADL's : Needs assistance/impaired Eating/Feeding: Modified independent;Sitting   Grooming: Wash/dry hands;Wash/dry face;Applying deodorant;Brushing hair;Min guard;Standing   Upper Body Bathing: Set up;Sitting   Lower Body Bathing: Minimal assistance;Sit to/from stand   Upper Body Dressing : Set up;Sitting   Lower Body Dressing: Sit to/from stand;Moderate assistance Lower Body Dressing Details (indicate cue type and reason): Pt able to doff socks, but unable to don.  She reports her son can assist.  Discussed option of sock aid and she is interested  Toilet Transfer: Minimal assistance;Ambulation;Comfort height toilet;Grab bars;RW   Toileting- Clothing Manipulation and Hygiene: Minimal assistance;Sit to/from stand       Functional mobility during ADLs: Minimal assistance;Min guard General ADL Comments: Pt is unsteady on her feet, with mild LOB.  Min A to steady      Vision         Perception     Praxis      Pertinent Vitals/Pain Pain Assessment: 0-10 Pain Score: 2  Pain Location: Lt hand  Pain Descriptors / Indicators: Sore Pain Intervention(s): Monitored during session     Hand Dominance Right   Extremity/Trunk Assessment Upper Extremity Assessment Upper Extremity Assessment: LUE  deficits/detail LUE Deficits / Details: shoulder and elbow grossly WFL.  Pt hold Lt wrist in flexion.  She is unable to extend wrist, however, when wrist placed in neutral, she is able to initiate small excursion of extension.  She reports she hasn't been able to extend her wrist since the injury 10-11 days ago.  SHe demonstrates grossly ~40* MCP flexion, and ~15-20* extensor lag of MCPs  LUE Coordination: decreased fine motor    Lower Extremity Assessment Lower Extremity Assessment: Generalized weakness   Cervical / Trunk Assessment Cervical / Trunk Assessment: Normal   Communication Communication Communication: No difficulties   Cognition Arousal/Alertness: Awake/alert Behavior During Therapy: WFL for tasks assessed/performed Overall Cognitive Status: Within Functional Limits for tasks assessed                                     General Comments  02 sats decreased to 86% on RA, >93% on 2L supplemental 02    Exercises Exercises: Other exercises Other Exercises Other Exercises: AAROM Lt wrist ext/flex x 10 Other Exercises: Tendon gliding exercises x 10 Lt UE Other Exercises: composite flex/ext Lt hand x 10    Shoulder Instructions      Home Living Family/patient expects to be discharged to:: Private residence Living Arrangements: Children Available Help at Discharge: Family;Available 24 hours/day Type of Home: House Home Access: Stairs to enter Entergy CorporationEntrance Stairs-Number of Steps: 2+1 Entrance Stairs-Rails: Left Home Layout: One level;Other (Comment) (1 step from living room to kitchen )     Bathroom Shower/Tub: Chief Strategy OfficerTub/shower unit   Bathroom Toilet: Standard     Home Equipment: Cane - single point;Tub bench   Additional Comments: son lives with pt and can provide assistance       Prior Functioning/Environment Level of Independence: Independent        Comments: Pt reports she was independent with ADLs.  Son does most of grocery shopping.  Ambulated short distances.  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 [employer]         OT Problem List: Decreased strength;Decreased range of motion;Decreased activity tolerance;Impaired balance (sitting and/or standing);Decreased knowledge of use of DME or AE;Cardiopulmonary status limiting activity;Obesity;Impaired UE functional use      OT Treatment/Interventions: Self-care/ADL training;Therapeutic exercise;DME and/or AE  instruction;Therapeutic activities;Patient/family education;Balance training;Splinting    OT Goals(Current goals can be found in the care plan section) Acute Rehab OT Goals Patient Stated Goal: to get better  OT Goal Formulation: With patient Time For Goal Achievement: 08/01/17 Potential to Achieve Goals: Good ADL Goals Pt Will Perform Grooming: with modified independence;standing Pt Will Perform Upper Body Bathing: with modified independence;sitting Pt Will Perform Lower Body Bathing: with modified independence;with adaptive equipment;sit to/from stand Pt Will Perform Upper Body Dressing: with modified independence;sitting Pt Will Perform Lower Body Dressing: with modified independence;with adaptive equipment;sit to/from stand Pt Will Transfer to Toilet: with modified independence;ambulating;regular height toilet;bedside commode;grab bars Pt Will Perform Toileting - Clothing Manipulation and hygiene: with modified independence;sit to/from stand Pt Will Perform Tub/Shower Transfer: Tub transfer;with modified independence;ambulating;tub bench;rolling walker Pt/caregiver will Perform Home Exercise Program: Increased ROM;Increased strength;With written HEP provided;Independently  OT Frequency: Min 2X/week   Barriers to D/C:            Co-evaluation              AM-PAC PT "6 Clicks" Daily Activity     Outcome Measure Help from another  person eating meals?: None Help from another person taking care of personal grooming?: A Little Help from another person toileting, which includes using toliet, bedpan, or urinal?: A Little Help from another person bathing (including washing, rinsing, drying)?: A Little Help from another person to put on and taking off regular upper body clothing?: A Little Help from another person to put on and taking off regular lower body clothing?: A Lot 6 Click Score: 18   End of Session Equipment Utilized During Treatment: Oxygen Nurse Communication: Mobility  status  Activity Tolerance: Patient tolerated treatment well;Patient limited by fatigue Patient left: in chair;with call bell/phone within reach  OT Visit Diagnosis: Unsteadiness on feet (R26.81)                Time: 1610-9604 OT Time Calculation (min): 50 min Charges:  OT General Charges $OT Visit: 1 Procedure OT Evaluation $OT Eval Moderate Complexity: 1 Procedure OT Treatments $Self Care/Home Management : 8-22 mins $Therapeutic Activity: 8-22 mins G-Codes: OT G-codes **NOT FOR INPATIENT CLASS** Functional Assessment Tool Used: AM-PAC 6 Clicks Daily Activity Functional Limitation: Self care Self Care Current Status (V4098): At least 40 percent but less than 60 percent impaired, limited or restricted Self Care Goal Status (J1914): At least 20 percent but less than 40 percent impaired, limited or restricted   [employer], OTR/L [phone]   Jeani Hawking M 07/25/2017, 5:46 PM

## 2017-07-25 NOTE — Progress Notes (Signed)
Pt arrives to the floor with the PACU nurse. She is alert and oriented in no apparent distress, and dinies pain. VSS, pt  Hooked up to the phillips monitoring system, CCMD notified. We'll continue to monitor.

## 2017-07-25 NOTE — Consult Note (Addendum)
WOC consult requested prior to hand surgeon involvement.  Pt is now followed by their team and hand surgery was performed on 7/29 by Dr Eulah PontMurphy.  Please refer to their team for further plan of care. Please re-consult if further assistance is needed.  Thank-you,  Cammie Mcgeeawn Jamillia Closson MSN, RN, CWOCN, [place]AP, CNS [phone]

## 2017-07-25 NOTE — Progress Notes (Signed)
Family Medicine Teaching Service Daily Progress Note Intern Pager: [phone]  Patient name: Sylvia Craig Medical record number: [id number] Date of birth: 1951/12/10 Age: 66 y.o. Gender: female  Primary Care Provider: System, Pcp Not In Consultants: hand surgery  Code Status: Full   Pt Overview and Major Events to Date:  Sylvia HoveDebbie Craig a 66 y.o.female who presented on 7/29 with left hand infection 2/2 mechanical fall 10 days prior to admission. [facility] Left hand debridement by hand surgery on 7/29.    Assessment and Plan: Sylvia HoveDebbie Craig a 66 y.o.female who presented on 7/29 with left hand infection 2/2 mechanical fall 10 days prior to admission. PMH is significant for T2DM, HTN, HLD, morbid obesity, vitamin D deficiency, CKD stage 3, iron deficiency anemia, [name] s/p triple vessel cath in 2015 with no stents, CHF NYHA class 2, a fib, and hypothyroidism.  Left hand infection  2/2 mechanical fall. Patient had laceration to left hand following fall which has progressively gotten worse. Patient now reports purulent drainage. Hand surgery was consulted and performed debridement on 7/29. Afebrile. Leukocytosis improved from 14.0 to 12.2. Xray of hand showed no acute osseous abnormality, and soft tissue injury overlying the dorsal aspect of the hand. CXR showing cardiomegaly, and no acute cardiopulmonary process with no fractures (patient reports hitting left chest in fall). Patient given IV vancomycin and IV Zosyn in ED with Code Sepsis order set and started on Ancef by [facility]PTS on 7/29. Patient today states hand feels better but is still sore. States some irritation. Preliminary blood cultures showing few gram positive cocci, few gram positive rods, and rare gram negative rods. Lactic acid 1.33.  -appreciate surgery recommendations  -blood cultures pending -continue cefazolin 2 g IV, consider changing once final blood culture and sensitivities result  -Re start Eliquis per Sylvia HackerHenry  Martensen, PA -continuous pulse ox   -tylenol 1000 mg q6hrs prn pain   Respiratory distress Likely 2/2 fluid overload due to NS boluses from code sepsis. Patient given 40 mg IV lasix by PACU. Repeat CXR showing no frank edema, and shallow inspiration with atelectasis. Atrovent neg given in PACU. Patient today denies shortness of breath and states she is improved. Patient currently satting at 95% on 2L via nasal canula with RR of 29. Patient was not wearing nasal cannula when I was in room and was saturating at 100% on RA. Current weight 279 lb (dry weight 266 lbs).  -strict I's and O's via foley  -continue po lasix 40 mg  -wean off O2  Skin irritation Skin irritation of bilaterally inner thighs. Patient states it is not bothering her.  -continue desitin PRN  T2DM Current CBG of 140. Hemoglobin A1C of 8.0 on 09/16/2016. Home humalog 6 units TID with meals, Amaryl 4mg  BID, lantus 26 units daily.  -monitor A1C -monitor CBGs ACHS -lantus 16 units at bedtime -moderate SSI  HTN BP of 116/69. Home meds are toprol XL 100mg , cozaar 25 mg. Will dose metoprolol 50mg  BID to avoid rebound tachycardia as BP can tolerate.  -metoprolol immediate release 50 mg bid -continue to monitor   HFpEF Echo 2015 showing EF 45%, Cath 2015 showing EF 35%. Weight on 06/28/2016 266 lb 6.4 oz. Weight on admission of 267 lbs. Current weight 279 lbs. [facility]([place], [place] Home lasix 40 mg BID and losartan 25 mg. Patient initially ordered for 30mg /kg NS boluses, but upon discovery of reduced EF, patient had received 2L NS boluses and additional boluses were discontinued. Slight  crackles at bases bilaterally.  -continue lasix 40 mg po to start 7/30 -monitor fluid status -I's and O's -consider restarting losartan at discharge   Atrial fibrillation  On home Eliquis 2.5 mg and metoprolol. Unable to appreciate why Eliquis is dosed at 2.5mg  BID (per pharmacy patient would have to have 2 of 3: age >57, Cr >1.5,  and <100kg). Patient has one of these criteria (Cr >1.5 in recent past). Patient self discontinued Eliquis since the fall on 7/19.  -Re start Elliquis per Sylvia Hacker, PA  HLD Home crestor 10 mg -continue home crestor  CKD stage 3 Current Cr 1.43. Per chart review baseline ~1.4.  -continue to monitor   Iron deficiency anemia  Current CBC wnl showing Hgb 12.5, Hct 39.2, MCV 97.3. Home iron supplementation.  -no supplementation during acute illness  [name]  Home nitro prn, metoprolol, and crestor. Patient denies current CP.    -continue nitro prn  Hypothyroidism  Home synthroid 137 mcg -continue home synthroid   FEN/GI: carb modified  PPx: Eliquis 2.5 mg bid  Disposition: improving, consider possible d/c on 7/30 pending hand surgery recommendations   Subjective:  Patient today notes improvement. Denies SOB, CP, or leg edema. Patient has slight cough. Patient's hand feels better and is a little sore with slight irritation. Patient denies fever, chills, nausea, vomiting, or diarrhea. Patient is tolerating antibiotics well.   Objective: Temp:  [97.3 F (36.3 C)-98.6 F (37 C)] 98.6 F (37 C) (07/30 1114) Pulse Rate:  [30-137] 79 (07/30 1114) Resp:  [16-32] 24 (07/30 1114) BP: (72-166)/(42-111) 116/69 (07/30 1114) SpO2:  [74 %-100 %] 96 % (07/30 1114) Weight:  [267 lb (121.1 kg)-279 lb 4.8 oz (126.7 kg)] 279 lb 4.8 oz (126.7 kg) (07/29 2304) Physical Exam: General: awake and alert, NAD, sitting up in bed eating breakfast  Cardiovascular: RRR, no MRG Respiratory: slight crackles at bases, no wheezes.  Abdomen: soft, non tender, non distended, bowel sounds x 4 quadrants  Extremities: soft, no edema. Erythema bilaterally   Laboratory:  Recent Labs Lab 07/24/17 1313 07/25/17 0735  WBC 14.0* 12.2*  HGB 13.8 12.5  HCT 43.0 39.2  PLT 270 245    Recent Labs Lab 07/24/17 1313 07/25/17 0735  NA 137 138  K 4.9 5.1  CL 101 105  CO2 26 25  BUN 40* 35*   CREATININE 1.50* 1.43*  CALCIUM 9.7 9.0  GLUCOSE 274* 138*     Imaging/Diagnostic Tests: Dg Chest 2 View  Result Date: 07/24/2017 CLINICAL DATA:  Patient status post fall. EXAM: CHEST  2 VIEW COMPARISON:  None. FINDINGS: Monitoring leads overlie the patient. Cardiomegaly. No consolidative pulmonary opacities. No pleural effusion or pneumothorax. Thoracic spine degenerative changes. IMPRESSION: Cardiomegaly.  No acute cardiopulmonary process. Electronically Signed   By: Annia Belt M.D.   On: 07/24/2017 16:58   Dg Chest Port 1 View  Result Date: 07/24/2017 CLINICAL DATA:  Shortness of breath.  Postoperative patient. EXAM: PORTABLE CHEST 1 VIEW COMPARISON:  07/24/2017 FINDINGS: Shallow inspiration with atelectasis in the lung bases. Cardiac enlargement. Pulmonary vascularity is normal for technique. No consolidation or edema. No blunting of costophrenic angles. No pneumothorax. Calcification of the aorta. Old healed fracture deformity of the left clavicle. IMPRESSION: Shallow inspiration with atelectasis in the lung bases. Cardiac enlargement. No consolidation. Electronically Signed   By: Burman Nieves M.D.   On: 07/24/2017 21:27   Dg Hand Complete Left  Result Date: 07/24/2017 CLINICAL DATA:  Patient status post fall. Left hand wound around  the posterior aspect of the carpal bones. Initial encounter. EXAM: LEFT HAND - COMPLETE 3+ VIEW COMPARISON:  None. FINDINGS: Soft tissue defect overlying the dorsal aspect of the hand. Vascular calcifications. Normal anatomic alignment. No evidence for acute fracture or dislocation. IMPRESSION: No acute osseous abnormality. Soft tissue injury overlying the dorsal aspect of the hand. Electronically Signed   By: Annia Beltrew  Davis M.D.   On: 07/24/2017 16:58     Oralia ManisAbraham, Makinzie Considine, DO 07/25/2017, 12:05 PM PGY-1, [facility] Intern pager: [phone], text pages welcome

## 2017-07-25 NOTE — Care Management Obs Status (Signed)
MEDICARE OBSERVATION STATUS NOTIFICATION   Patient Details  Name: Sylvia Craig MRN: [id number] Date of Birth: 1951/01/02   Medicare Observation Status Notification Given:  Yes    Darrold SpanWebster, Analie Katzman Hall, RN 07/25/2017, 4:42 PM

## 2017-07-25 NOTE — Consult Note (Signed)
Reason for Consult: hand wound Referring Physician: Dr. Edmonia Lynch  Sylvia Craig is an 66 y.o. female.  HPI: The patient is a 66 yrs old wf here for treatment of her left hand wound / infection.  Patient states that one week ago she fell and caught her left hand on the hardware of the cabinet.  She noted a wound but thought it would be ok.  Yesterday the swelling and pain was severe and she felt ill.  She has multiple medical conditions including DM, CHF, A fib and thyroid disease.  She was taken to the OR for debridement and is now on the unit receiving IV antibiotics.  The wound is on the dorsal aspect of the hand and is 4 x 6 cm in size.  There is a small area of tendon exposed.  There is proximal redness and swelling but it appears to be improving.  She is trying to keep her hand elevated.  Past Medical History:  Diagnosis Date  . CHF (congestive heart failure) ([place])   . Diabetes mellitus without complication ([place])   . Hypertension   . Iron (Fe) deficiency anemia   . Thyroid disease     Past Surgical History:  Procedure Laterality Date  . I&D EXTREMITY Left 07/24/2017   Procedure: IRRIGATION AND DEBRIDEMENT EXTREMITY;  Surgeon: Renette Butters, MD;  Location: [place];  Service: Orthopedics;  Laterality: Left;    History reviewed. No pertinent family history.  Social History:  reports that she quit smoking about 29 years ago. Her smoking use included Cigarettes. She smoked 1.00 pack per day. She has never used smokeless tobacco. She reports that she does not drink alcohol or use drugs.  Allergies:  Allergies  Allergen Reactions  . Shellfish-Derived Products Shortness Of Breath  . Codeine Other (See Comments)    Rash, edema  . Atorvastatin Other (See Comments)    Myalgias   . Lisinopril Other (See Comments)    Cough   . Other Other (See Comments)    Crab, chest tightness  . Oxycodone-Aspirin Other (See Comments)    Rash, dreams    Medications: I have reviewed the  patient's current medications.  Results for orders placed or performed during the hospital encounter of 07/24/17 (from the past 48 hour(s))  CBC with Differential     Status: Abnormal   Collection Time: 07/24/17  1:13 PM  Result Value Ref Range   WBC 14.0 (H) 4.0 - 10.5 K/uL   RBC 4.54 3.87 - 5.11 MIL/uL   Hemoglobin 13.8 12.0 - 15.0 g/dL   HCT 43.0 36.0 - 46.0 %   MCV 94.7 78.0 - 100.0 fL   MCH 30.4 26.0 - 34.0 pg   MCHC 32.1 30.0 - 36.0 g/dL   RDW 13.3 11.5 - 15.5 %   Platelets 270 150 - 400 K/uL   Neutrophils Relative % 83 %   Neutro Abs 11.6 (H) 1.7 - 7.7 K/uL   Lymphocytes Relative 8 %   Lymphs Abs 1.1 0.7 - 4.0 K/uL   Monocytes Relative 7 %   Monocytes Absolute 1.0 0.1 - 1.0 K/uL   Eosinophils Relative 2 %   Eosinophils Absolute 0.3 0.0 - 0.7 K/uL   Basophils Relative 0 %   Basophils Absolute 0.0 0.0 - 0.1 K/uL  Basic metabolic panel     Status: Abnormal   Collection Time: 07/24/17  1:13 PM  Result Value Ref Range   Sodium 137 135 - 145 mmol/L   Potassium 4.9 3.5 -  5.1 mmol/L   Chloride 101 101 - 111 mmol/L   CO2 26 22 - 32 mmol/L   Glucose, Bld 274 (H) 65 - 99 mg/dL   BUN 40 (H) 6 - 20 mg/dL   Creatinine, Ser 1.50 (H) 0.44 - 1.00 mg/dL   Calcium 9.7 8.9 - 10.3 mg/dL   GFR calc non Af Amer 35 (L) >60 mL/min   GFR calc Af Amer 40 (L) >60 mL/min    Comment: (NOTE) The eGFR has been calculated using the CKD EPI equation. This calculation has not been validated in all clinical situations. eGFR's persistently <60 mL/min signify possible Chronic Kidney Disease.    Anion gap 10 5 - 15  CBG monitoring, ED     Status: Abnormal   Collection Time: 07/24/17  1:32 PM  Result Value Ref Range   Glucose-Capillary 250 (H) 65 - 99 mg/dL  Blood Culture (routine x 2)     Status: None (Preliminary result)   Collection Time: 07/24/17  2:01 PM  Result Value Ref Range   Specimen Description BLOOD RIGHT FOREARM    Special Requests      IN PEDIATRIC BOTTLE Blood Culture results may  not be optimal due to an excessive volume of blood received in culture bottles   Culture NO GROWTH < 24 HOURS    Report Status PENDING   Blood Culture (routine x 2)     Status: None (Preliminary result)   Collection Time: 07/24/17  2:08 PM  Result Value Ref Range   Specimen Description BLOOD RIGHT ANTECUBITAL    Special Requests      BOTTLES DRAWN AEROBIC AND ANAEROBIC Blood Culture adequate volume   Culture NO GROWTH < 24 HOURS    Report Status PENDING   I-Stat CG4 Lactic Acid, ED  (not at  [facility])     Status: None   Collection Time: 07/24/17  2:29 PM  Result Value Ref Range   Lactic Acid, Venous 1.33 0.5 - 1.9 mmol/L  Urinalysis, Routine w reflex microscopic     Status: Abnormal   Collection Time: 07/24/17  5:52 PM  Result Value Ref Range   Color, Urine YELLOW YELLOW   APPearance CLEAR CLEAR   Specific Gravity, Urine 1.012 1.005 - 1.030   pH 6.0 5.0 - 8.0   Glucose, UA 50 (A) NEGATIVE mg/dL   Hgb urine dipstick NEGATIVE NEGATIVE   Bilirubin Urine NEGATIVE NEGATIVE   Ketones, ur NEGATIVE NEGATIVE mg/dL   Protein, ur 100 (A) NEGATIVE mg/dL   Nitrite POSITIVE (A) NEGATIVE   Leukocytes, UA LARGE (A) NEGATIVE   RBC / HPF 0-5 0 - 5 RBC/hpf   WBC, UA TOO NUMEROUS TO COUNT 0 - 5 WBC/hpf   Bacteria, UA RARE (A) NONE SEEN   Squamous Epithelial / LPF 0-5 (A) NONE SEEN  Aerobic Culture (superficial specimen)     Status: None (Preliminary result)   Collection Time: 07/24/17  7:30 PM  Result Value Ref Range   Specimen Description TISSUE LEFT HAND    Special Requests PATIENT ON FOLLOWING VANCOMYCIN ZOCYN    Gram Stain      RARE WBC PRESENT,BOTH PMN AND MONONUCLEAR FEW GRAM POSITIVE COCCI FEW GRAM POSITIVE RODS RARE GRAM NEGATIVE RODS    Culture CULTURE REINCUBATED FOR BETTER GROWTH    Report Status PENDING   Glucose, capillary     Status: None   Collection Time: 07/24/17  7:51 PM  Result Value Ref Range   Glucose-Capillary 90 65 - 99 mg/dL  Glucose,  capillary     Status: Abnormal    Collection Time: 07/24/17 11:57 PM  Result Value Ref Range   Glucose-Capillary 171 (H) 65 - 99 mg/dL   Comment 1 Notify RN    Comment 2 Document in Chart   Glucose, capillary     Status: Abnormal   Collection Time: 07/25/17  5:58 AM  Result Value Ref Range   Glucose-Capillary 140 (H) 65 - 99 mg/dL   Comment 1 Notify RN    Comment 2 Document in Chart   Basic metabolic panel     Status: Abnormal   Collection Time: 07/25/17  7:35 AM  Result Value Ref Range   Sodium 138 135 - 145 mmol/L   Potassium 5.1 3.5 - 5.1 mmol/L   Chloride 105 101 - 111 mmol/L   CO2 25 22 - 32 mmol/L   Glucose, Bld 138 (H) 65 - 99 mg/dL   BUN 35 (H) 6 - 20 mg/dL   Creatinine, Ser 1.43 (H) 0.44 - 1.00 mg/dL   Calcium 9.0 8.9 - 10.3 mg/dL   GFR calc non Af Amer 37 (L) >60 mL/min   GFR calc Af Amer 43 (L) >60 mL/min    Comment: (NOTE) The eGFR has been calculated using the CKD EPI equation. This calculation has not been validated in all clinical situations. eGFR's persistently <60 mL/min signify possible Chronic Kidney Disease.    Anion gap 8 5 - 15  CBC     Status: Abnormal   Collection Time: 07/25/17  7:35 AM  Result Value Ref Range   WBC 12.2 (H) 4.0 - 10.5 K/uL   RBC 4.03 3.87 - 5.11 MIL/uL   Hemoglobin 12.5 12.0 - 15.0 g/dL   HCT 39.2 36.0 - 46.0 %   MCV 97.3 78.0 - 100.0 fL   MCH 31.0 26.0 - 34.0 pg   MCHC 31.9 30.0 - 36.0 g/dL   RDW 13.5 11.5 - 15.5 %   Platelets 245 150 - 400 K/uL  Glucose, capillary     Status: Abnormal   Collection Time: 07/25/17 11:18 AM  Result Value Ref Range   Glucose-Capillary 190 (H) 65 - 99 mg/dL   Comment 1 Notify RN   Glucose, capillary     Status: Abnormal   Collection Time: 07/25/17  3:46 PM  Result Value Ref Range   Glucose-Capillary 243 (H) 65 - 99 mg/dL   Comment 1 Notify RN     Dg Chest 2 View  Result Date: 07/24/2017 CLINICAL DATA:  Patient status post fall. EXAM: CHEST  2 VIEW COMPARISON:  None. FINDINGS: Monitoring leads overlie the patient.  Cardiomegaly. No consolidative pulmonary opacities. No pleural effusion or pneumothorax. Thoracic spine degenerative changes. IMPRESSION: Cardiomegaly.  No acute cardiopulmonary process. Electronically Signed   By: Lovey Newcomer M.D.   On: 07/24/2017 16:58   Dg Chest Port 1 View  Result Date: 07/24/2017 CLINICAL DATA:  Shortness of breath.  Postoperative patient. EXAM: PORTABLE CHEST 1 VIEW COMPARISON:  07/24/2017 FINDINGS: Shallow inspiration with atelectasis in the lung bases. Cardiac enlargement. Pulmonary vascularity is normal for technique. No consolidation or edema. No blunting of costophrenic angles. No pneumothorax. Calcification of the aorta. Old healed fracture deformity of the left clavicle. IMPRESSION: Shallow inspiration with atelectasis in the lung bases. Cardiac enlargement. No consolidation. Electronically Signed   By: Lucienne Capers M.D.   On: 07/24/2017 21:27   Dg Hand Complete Left  Result Date: 07/24/2017 CLINICAL DATA:  Patient status post fall. Left hand wound around the  posterior aspect of the carpal bones. Initial encounter. EXAM: LEFT HAND - COMPLETE 3+ VIEW COMPARISON:  None. FINDINGS: Soft tissue defect overlying the dorsal aspect of the hand. Vascular calcifications. Normal anatomic alignment. No evidence for acute fracture or dislocation. IMPRESSION: No acute osseous abnormality. Soft tissue injury overlying the dorsal aspect of the hand. Electronically Signed   By: Lovey Newcomer M.D.   On: 07/24/2017 16:58    Review of Systems  Constitutional: Positive for fever.  HENT: Negative.   Eyes: Negative.   Respiratory: Negative.   Cardiovascular: Negative.   Gastrointestinal: Negative.   Genitourinary: Negative.   Musculoskeletal: Negative.   Neurological: Positive for weakness.  Psychiatric/Behavioral: Negative.    Blood pressure (!) 98/56, pulse 78, temperature 97.9 F (36.6 C), temperature source Oral, resp. rate 19, height 5' 5" (1.651 m), weight 126.7 kg (279 lb 4.8  oz), SpO2 98 %. Physical Exam  Constitutional: She is oriented to person, place, and time. She appears well-developed.  HENT:  Head: Normocephalic and atraumatic.  Eyes: Pupils are equal, round, and reactive to light. EOM are normal.  Cardiovascular: Normal rate.   Respiratory: Effort normal.  Musculoskeletal: She exhibits edema and tenderness.  Neurological: She is alert and oriented to person, place, and time.  Psychiatric: She has a normal mood and affect.    Assessment/Plan: Recommend multivitamin as able with Vit C 500 mg daily, Zinc 220 mg daily and increase protein as kidney function permits.  Diabetic counseling and nutrition consult while inpatient.  Keep hand elevated as much as possible.  Continue with wet to dry dressing changes 3 times a day.  Will try to get patient on schedule for Wed or Thursday for debridement and possible Acell / VAC placement.  Will check HgA1C and Prealbumin.  Wallace Going 07/25/2017, 5:46 PM

## 2017-07-26 ENCOUNTER — Ambulatory Visit: Payer: Self-pay | Admitting: Plastic Surgery

## 2017-07-26 DIAGNOSIS — S61402A Unspecified open wound of left hand, initial encounter: Principal | ICD-10-CM

## 2017-07-26 DIAGNOSIS — S61402D Unspecified open wound of left hand, subsequent encounter: Secondary | ICD-10-CM

## 2017-07-26 DIAGNOSIS — I96 Gangrene, not elsewhere classified: Secondary | ICD-10-CM

## 2017-07-26 LAB — BASIC METABOLIC PANEL
ANION GAP: 6 (ref 5–15)
BUN: 37 mg/dL — AB (ref 6–20)
CO2: 28 mmol/L (ref 22–32)
Calcium: 8.8 mg/dL — ABNORMAL LOW (ref 8.9–10.3)
Chloride: 101 mmol/L (ref 101–111)
Creatinine, Ser: 1.59 mg/dL — ABNORMAL HIGH (ref 0.44–1.00)
GFR calc Af Amer: 38 mL/min — ABNORMAL LOW (ref >60)
GFR calc non Af Amer: 33 mL/min — ABNORMAL LOW (ref >60)
GLUCOSE: 214 mg/dL — AB (ref 65–99)
POTASSIUM: 5.1 mmol/L (ref 3.5–5.1)
Sodium: 135 mmol/L (ref 135–145)

## 2017-07-26 LAB — CBC
HEMATOCRIT: 40.8 % (ref 36.0–46.0)
HEMOGLOBIN: 12.9 g/dL (ref 12.0–15.0)
MCH: 30.9 pg (ref 26.0–34.0)
MCHC: 31.6 g/dL (ref 30.0–36.0)
MCV: 97.6 fL (ref 78.0–100.0)
Platelets: 242 10*3/uL (ref 150–400)
RBC: 4.18 MIL/uL (ref 3.87–5.11)
RDW: 13.7 % (ref 11.5–15.5)
WBC: 13.4 10*3/uL — ABNORMAL HIGH (ref 4.0–10.5)

## 2017-07-26 LAB — GLUCOSE, CAPILLARY
GLUCOSE-CAPILLARY: 131 mg/dL — AB (ref 65–99)
Glucose-Capillary: 201 mg/dL — ABNORMAL HIGH (ref 65–99)
Glucose-Capillary: 156 mg/dL — ABNORMAL HIGH (ref 65–99)
Glucose-Capillary: 165 mg/dL — ABNORMAL HIGH (ref 65–99)

## 2017-07-26 LAB — PREALBUMIN: Prealbumin: 12.5 mg/dL — ABNORMAL LOW (ref 18–38)

## 2017-07-26 LAB — MRSA PCR SCREENING: MRSA by PCR: NEGATIVE

## 2017-07-26 LAB — HEMOGLOBIN A1C
HEMOGLOBIN A1C: 9.7 % — AB (ref 4.8–5.6)
MEAN PLASMA GLUCOSE: 232 mg/dL

## 2017-07-26 MED ORDER — PRO-STAT SUGAR FREE PO LIQD
30.0000 mL | Freq: Two times a day (BID) | ORAL | Status: DC
  Administered 2017-07-26 – 2017-07-30 (×6): 30 mL via ORAL
  Filled 2017-07-26 (×6): qty 30

## 2017-07-26 MED ORDER — CHLORHEXIDINE GLUCONATE CLOTH 2 % EX PADS
6.0000 | MEDICATED_PAD | Freq: Once | CUTANEOUS | Status: AC
  Administered 2017-07-27: 6 via TOPICAL

## 2017-07-26 MED ORDER — MIDAZOLAM HCL 2 MG/2ML IJ SOLN
INTRAMUSCULAR | Status: AC
  Filled 2017-07-26: qty 2

## 2017-07-26 MED ORDER — VITAMIN C 500 MG PO TABS
500.0000 mg | ORAL_TABLET | Freq: Every day | ORAL | Status: DC
  Administered 2017-07-26 – 2017-07-30 (×5): 500 mg via ORAL
  Filled 2017-07-26 (×5): qty 1

## 2017-07-26 MED ORDER — CHLORHEXIDINE GLUCONATE CLOTH 2 % EX PADS
6.0000 | MEDICATED_PAD | Freq: Once | CUTANEOUS | Status: AC
  Administered 2017-07-26: 6 via TOPICAL

## 2017-07-26 MED ORDER — INSULIN GLARGINE 100 UNIT/ML ~~LOC~~ SOLN
18.0000 [IU] | Freq: Every day | SUBCUTANEOUS | Status: DC
  Administered 2017-07-26 – 2017-07-29 (×4): 18 [IU] via SUBCUTANEOUS
  Filled 2017-07-26 (×4): qty 0.18

## 2017-07-26 MED ORDER — DEXTROSE 5 % IV SOLN
3.0000 g | INTRAVENOUS | Status: DC
  Filled 2017-07-26: qty 3000

## 2017-07-26 MED ORDER — ADULT MULTIVITAMIN W/MINERALS CH
1.0000 | ORAL_TABLET | Freq: Every day | ORAL | Status: DC
  Administered 2017-07-26 – 2017-07-30 (×5): 1 via ORAL
  Filled 2017-07-26 (×5): qty 1

## 2017-07-26 MED ORDER — ZINC SULFATE 220 (50 ZN) MG PO CAPS
220.0000 mg | ORAL_CAPSULE | Freq: Every day | ORAL | Status: DC
  Administered 2017-07-26 – 2017-07-30 (×5): 220 mg via ORAL
  Filled 2017-07-26 (×6): qty 1

## 2017-07-26 NOTE — Plan of Care (Signed)
Problem: Food- and Nutrition-Related Knowledge Deficit (NB-1.1) Goal: Nutrition education Formal process to instruct or train a patient/client in a skill or to impart knowledge to help patients/clients voluntarily manage or modify food choices and eating behavior to maintain or improve health. Outcome: Completed/Met Date Met: 07/26/17  RD consulted for wound healing. Pt admitted with left hand wound requiring surgical debridement. Pt with hx of DM, CKD III, HTN, CHF, [name]   Lab Results  Component Value Date   HGBA1C 9.7 (H) 07/25/2017    RD provided "Carbohydrate Counting for People with Diabetes" handout from the Academy of Nutrition and Dietetics. Discussed different food groups and their effects on blood sugar, emphasizing carbohydrate-containing foods. Provided list of carbohydrates and recommended serving sizes of common foods.  Discussed importance of controlled and consistent carbohydrate intake throughout the day. Provided examples of ways to balance meals/snacks and encouraged intake of high-fiber, whole grain complex carbohydrates. Teach back method used.  Reinforced importance of adequate protein intake, in addition to good glucose management. Reviewed sources of protein and encouraged protein with all meals and snacks. To promote wound healing, will supplement with Pro-Stat protein modular BID at this time. Pt agreeable to this. Orders placed.   Expect good compliance.  Body mass index is 46.48 kg/m. Pt meets criteria for morbid obesity based on current BMI.  Pt reports no changes in weight PTA.   Current diet order is Carb Modified, patient is consuming approximately 100% of meals at this time. Pt reports good appetite PTA as well. Labs and medications reviewed. No further nutrition interventions warranted at this time. RD contact information provided. If additional nutrition issues arise, please re-consult RD.  Kerman Passey MS, RD, LDN [phone] Pager  [phone]  Weekend/On-Call Pager

## 2017-07-26 NOTE — Progress Notes (Signed)
Family Medicine Teaching Service Daily Progress Note Intern Pager: [phone]  Patient name: Sylvia Craig Medical record number: [id number] Date of birth: December 30, 1950 Age: 66 y.o. Gender: female  Primary Care Provider: System, Pcp Not In Consultants: hand surgery  Code Status: Full   Pt Overview and Major Events to Date:  Sylvia HoveDebbie Dunnis a 66 y.o.female who presented on 7/29with left hand infection 2/2 mechanical fall 10 days prior to admission. [facility] Left hand debridement by hand surgery on 7/29.    Assessment and Plan: Sylvia HoveDebbie Dunnis a 66 y.o.female who presented on 7/29with left hand infection 2/2 mechanical fall 10 days prior to admission. PMH is significant for T2DM, HTN, HLD, morbid obesity, vitamin D deficiency, CKD stage 3, iron deficiency anemia, [name] s/p triple vessel cath in 2015 with no stents, CHF NYHA class 2, a fib, and hypothyroidism.  Left hand infection  2/2 mechanical fall. Hand surgery was consulted and performed debridement on 7/29. Currently afebrile. Leukocytosis improved from 14.0 on admission to to 13.4. Xray of hand showed no acute osseous abnormality, and soft tissue injury overlying the dorsal aspect of the hand. CXR showing cardiomegaly, and no acute cardiopulmonary process with no fractures (patient reports hitting left chest in fall).Patient given IV vancomycin and IV Zosyn in ED with Code Sepsis order set and started on Ancef by FPTS on 7/29. Preliminary blood cultures showing few gram positive cocci, few gram positive rods, and rare gram negative rods. Lactic acid 1.33. Wound care consulted prior to debridement and have referred care to hand surgery. Plastic surgery recommendations of Vit C 500 mg daily, Zinc 220 mg daily, increasing protein in diet as tolerated by kidneys, diabetic counseling, and nutrition counseling, elevation of hand, and continued wet to dry dressing changes tid. Have recommended for debridement and possible Acell / VAC  placement on Wed or Thurs. Patient today states improvement. Denies pain in hand. States no fever or chills. No swelling or erythema on exam.  -appreciate hand and plastic surgery recommendations  -appreciate wound care recommendations  -blood cultures pending -continue cefazolin 2 g IV, consider changing once final blood culture and sensitivities result  -Re start Eliquis per Aquilla HackerHenry Martensen, PA (hand surgery) -continuous pulse ox  -tylenol 1000 mg q6hrs prn pain   Respiratory distress Likely 2/2 fluid overload due to NS boluses from code sepsis. Patient given 40 mg IV lasix by PACU. Repeat CXR showing no frank edema, and shallow inspiration with atelectasis. Atrovent neg given in PACU. Patient currently satting at 97% on 2L via nasal canula with RR of 17. Current weight 279 lb (dry weight 266 lbs). Patient denies SOB, but endorses cough without sputum production. -strict I's and O's via foley  -continue po lasix 40 mg  -wean off O2  Skin irritation Skin irritation of bilaterally inner thighs. Patient states it is not bothering her.  -continue desitin PRN  T2DM Current CBG of 201. Current A1C of 9.7. Home humalog 6 units TID with meals, Amaryl 4mg  BID, lantus 26 units daily.  -monitor CBGs ACHS -increase lantus to 18 units at bedtime -moderate SSI  HTN BP of 113/75. Home meds are toprol XL 100mg , cozaar 25 mg. Will dose metoprolol 50mg  BID to avoid rebound tachycardia as BP can tolerate.  -metoprolol immediate release 50 mg bid -continue to monitor   HFpEF Echo 2015 showing EF 45%, Cath 2015 showing EF 35%. Weight on 06/28/2016 266 lb 6.4 oz. Weight on admission of 267 lbs. Current weight 279 lbs. Followed  by Dr. Tami Ribasoss [facility]([place] Home lasix 40 mg BID and losartan 25 mg. Patient initially ordered for 30mg /kg NS boluses, but upon discovery of reduced EF, patient had received 2L NS boluses and additional boluses were discontinued. Patient was euvolemic on exam.  -continue lasix 40  mg po to start 7/30 -monitor fluid status -I's and O's -consider restarting losartan at discharge   Atrial fibrillation  On home Eliquis 2.5 mg and metoprolol. Unable to appreciate why Eliquis is dosed at 2.5mg  BID (per pharmacy patient would have to have 2 of 3: age 11>80, Cr >1.5, and <100kg). Patient has one of these criteria (Cr >1.5 in recent past). Patient self discontinued Eliquis since the fall on 7/19.  -Re start Elliquis per Aquilla HackerHenry Martensen, PA (hand surgery)  HLD Home crestor 10 mg -continue home crestor  CKD stage 3 Current Cr 1.59. Per chart review baseline ~1.4.  -continue to monitor   Iron deficiency anemia  Current CBC wnl showing Hgb 12.9, Hct 40.8, MCV 97.6. Home iron supplementation.  -no supplementation during acute illness  [name]  Home nitro prn, metoprolol, and crestor. Patient denies current CP.  -continue nitro prn -metoprolol immediate release 50 mg bid  Hypothyroidism  Home synthroid 137 mcg -continue home synthroid   FEN/GI: carb modified  PPx: Eliquis 2.5 mg bid  Disposition: dc home with outpatient OT, PT  Subjective:  Patient today states she is improved. Patient denies pain in hand. States slight headache last Craig that was alleviated with prn Tylenol. Patient denies fever/chills. Patient denies SOB. Patient has cough with no sputum production. States OT is improving hand functionality and pain.   Objective: Temp:  [97.9 F (36.6 C)-99.1 F (37.3 C)] 99.1 F (37.3 C) (07/31 0325) Pulse Rate:  [78-101] 83 (07/31 1057) Resp:  [14-21] 14 (07/31 1057) BP: (98-135)/(56-86) 103/79 (07/31 1057) SpO2:  [94 %-99 %] 94 % (07/31 1057) FiO2 (%):  [2 %] 2 % (07/30 1543) Physical Exam: General: awake and alert, NAD, lying in bed  Cardiovascular: RRR, no MRG Respiratory: CTAB, no wheezes, rales, or rhonchi  Abdomen: soft, non tender, non distended, bowel sounds x 4 quadrants  Extremities: soft, no edema, 2 sec capillary refill bilaterally,  left arm wrapped in dressing.   Laboratory:  Recent Labs Lab 07/24/17 1313 07/25/17 0735 07/26/17 0312  WBC 14.0* 12.2* 13.4*  HGB 13.8 12.5 12.9  HCT 43.0 39.2 40.8  PLT 270 245 242    Recent Labs Lab 07/24/17 1313 07/25/17 0735 07/26/17 0553  NA 137 138 135  K 4.9 5.1 5.1  CL 101 105 101  CO2 26 25 28   BUN 40* 35* 37*  CREATININE 1.50* 1.43* 1.59*  CALCIUM 9.7 9.0 8.8*  GLUCOSE 274* 138* 214*    Imaging/Diagnostic Tests: Dg Chest 2 View  Result Date: 07/24/2017 CLINICAL DATA:  Patient status post fall. EXAM: CHEST  2 VIEW COMPARISON:  None. FINDINGS: Monitoring leads overlie the patient. Cardiomegaly. No consolidative pulmonary opacities. No pleural effusion or pneumothorax. Thoracic spine degenerative changes. IMPRESSION: Cardiomegaly.  No acute cardiopulmonary process. Electronically Signed   By: Annia Beltrew  Davis M.D.   On: 07/24/2017 16:58   Dg Chest Port 1 View  Result Date: 07/24/2017 CLINICAL DATA:  Shortness of breath.  Postoperative patient. EXAM: PORTABLE CHEST 1 VIEW COMPARISON:  07/24/2017 FINDINGS: Shallow inspiration with atelectasis in the lung bases. Cardiac enlargement. Pulmonary vascularity is normal for technique. No consolidation or edema. No blunting of costophrenic angles. No pneumothorax. Calcification of the aorta. Old healed  fracture deformity of the left clavicle. IMPRESSION: Shallow inspiration with atelectasis in the lung bases. Cardiac enlargement. No consolidation. Electronically Signed   By: Burman Nieves M.D.   On: 07/24/2017 21:27   Dg Hand Complete Left  Result Date: 07/24/2017 CLINICAL DATA:  Patient status post fall. Left hand wound around the posterior aspect of the carpal bones. Initial encounter. EXAM: LEFT HAND - COMPLETE 3+ VIEW COMPARISON:  None. FINDINGS: Soft tissue defect overlying the dorsal aspect of the hand. Vascular calcifications. Normal anatomic alignment. No evidence for acute fracture or dislocation. IMPRESSION: No acute  osseous abnormality. Soft tissue injury overlying the dorsal aspect of the hand. Electronically Signed   By: Annia Belt M.D.   On: 07/24/2017 16:58     Oralia Manis, DO 07/26/2017, 12:21 PM PGY-1, [facility] Family Medicine FPTS Intern pager: [phone], text pages welcome

## 2017-07-26 NOTE — Progress Notes (Addendum)
Occupational Therapy Treatment Patient Details Name: Sylvia Craig MRN: [id number] DOB: [birth year] Today's Date: 07/26/2017    History of present illness This 66 y.o. female admitted with Lt hand infection secondary to soft tissue injury that occurred during a fall ~10 days PTA.  She underwent debridement of Lt hand by ortho on 7/29.  PMH includes:   CHF, DM, HTN   OT comments  Pt making good progress. After ROM and exercise/activities, pt able to achieve full composite flexion. Educated pt on edema control. During ambulation, pt desats to 85 on RA. At rest, 96 on 2 L, 91 on RA. Pt unsteady and benefits from use of RW. Will continue to follow acutely. Pt may benefit from wrist cock up splint after next surgery due to keeping wrist positioned in wrist for comfort.   Follow Up Recommendations  Outpatient OT;Supervision/Assistance - 24 hour    Equipment Recommendations  3 in 1 bedside commode;Other (comment)    Recommendations for Other Services PT consult    Precautions / Restrictions Precautions Precautions: Fall       Mobility Bed Mobility               General bed mobility comments: OOB n chair  Transfers Overall transfer level: Needs assistance Equipment used: Rolling walker (2 wheeled) Transfers: Sit to/from Stand Sit to Stand: Min guard Stand pivot transfers: Min guard            Balance     Sitting balance-Leahy Scale: Good       Standing balance-Leahy Scale: Fair                             ADL either performed or assessed with clinical judgement   ADL Overall ADL's : Needs assistance/impaired                                     Functional mobility during ADLs: Minimal assistance;Rolling walker General ADL Comments: Pt is unsteady on her feet, with mild LOB.  Min A to steady. Pt using hand to ift cup, hold light objects after ROM/exercises     Vision       Perception     Praxis      Cognition Arousal/Alertness:  Awake/alert Behavior During Therapy: WFL for tasks assessed/performed Overall Cognitive Status: Within Functional Limits for tasks assessed                                          Exercises Other Exercises Other Exercises: AAROM Lt wrist ext/flex x 10 Other Exercises: Tendon gliding exercises x 10 Lt UE Other Exercises: composite flex/ext Lt hand x 10  Other Exercises: place and hold x 10 composite flexion Other Exercises: A/AAROM L wrist using prayer stretch   Shoulder Instructions       General Comments  L forearm wrapped with ace bandage with limited thumb movement. Compression bandage removes and reapplied to help reduce edema and allow pt to use thumb/hand functionally.    Pertinent Vitals/ Pain       Pain Assessment: 0-10 Pain Score: 5  Pain Location: Lt hand  Pain Descriptors / Indicators: Sore Pain Intervention(s): Limited activity within patient's tolerance;Repositioned  Home Living  Prior Functioning/Environment              Frequency  Min 3X/week        Progress Toward Goals  OT Goals(current goals can now be found in the care plan section)  Progress towards OT goals: Progressing toward goals  Acute Rehab OT Goals Patient Stated Goal: to get better  OT Goal Formulation: With patient Time For Goal Achievement: 08/01/17 Potential to Achieve Goals: Good ADL Goals Pt Will Perform Grooming: with modified independence;standing Pt Will Perform Upper Body Bathing: with modified independence;sitting Pt Will Perform Lower Body Bathing: with modified independence;with adaptive equipment;sit to/from stand Pt Will Perform Upper Body Dressing: with modified independence;sitting Pt Will Perform Lower Body Dressing: with modified independence;with adaptive equipment;sit to/from stand Pt Will Transfer to Toilet: with modified independence;ambulating;regular height toilet;bedside  commode;grab bars Pt Will Perform Toileting - Clothing Manipulation and hygiene: with modified independence;sit to/from stand Pt Will Perform Tub/Shower Transfer: Tub transfer;with modified independence;ambulating;tub bench;rolling walker Pt/caregiver will Perform Home Exercise Program: Increased ROM;Increased strength;With written HEP provided;Independently  Plan Discharge plan remains appropriate;Frequency needs to be updated    Co-evaluation                 AM-PAC PT "6 Clicks" Daily Activity     Outcome Measure   Help from another person eating meals?: None Help from another person taking care of personal grooming?: A Little Help from another person toileting, which includes using toliet, bedpan, or urinal?: A Little Help from another person bathing (including washing, rinsing, drying)?: A Little Help from another person to put on and taking off regular upper body clothing?: A Little Help from another person to put on and taking off regular lower body clothing?: A Lot 6 Click Score: 18    End of Session Equipment Utilized During Treatment: Gait belt;Rolling walker;Oxygen  OT Visit Diagnosis: Unsteadiness on feet (R26.81);Muscle weakness (generalized) (M62.81)   Activity Tolerance Patient tolerated treatment well   Patient Left in chair;with call bell/phone within reach   Nurse Communication Mobility status;Other (comment) (O2 sats)        Time: [id number] OT Time Calculation (min): 45 min  Charges: OT General Charges $OT Visit: 1 Procedure OT Treatments $Therapeutic Activity: 8-22 mins $Therapeutic Exercise: 23-37 mins  [facility] Kenna Kirn, OT/L  [phone] 07/26/2017   Sylvia Craig,Sylvia Craig 07/26/2017, 10:01 AM

## 2017-07-26 NOTE — Progress Notes (Signed)
Inpatient Diabetes Program Recommendations  AACE/ADA: New Consensus Statement on Inpatient Glycemic Control (2015)  Target Ranges:  Prepandial:   less than 140 mg/dL      Peak postprandial:   less than 180 mg/dL (1-2 hours)      Critically ill patients:  140 - 180 mg/dL   Lab Results  Component Value Date   GLUCAP 131 (H) 07/26/2017   HGBA1C 9.7 (H) 07/25/2017    Review of Glycemic Control Results for Sylvia Craig, Sylvia Craig (MRN [id number]) as of 07/26/2017 14:05  Ref. Range 07/25/2017 11:18 07/25/2017 15:46 07/25/2017 22:01 07/26/2017 06:07 07/26/2017 11:16  Glucose-Capillary Latest Ref Range: 65 - 99 mg/dL 190 (H) 243 (H) 229 (H) 201 (H) 131 (H)   Diabetes history: DM2 Outpatient Diabetes medications: Lantus 26 + Humalog 6 units tid + Amaryl 4 mg BID Current orders for Inpatient glycemic control: Lantus 18 units + Novolog correction 0-15 units tid  Inpatient Diabetes Program Recommendations:   Spoke with pt @ bedside about A1C 9.7 (average CBG 232 over the past 2-3 months) results with them and explained what an A1C is, basic pathophysiology of DM Type 2, basic home care, basic diabetes diet nutrition principles, importance of checking CBGs and maintaining good CBG control to prevent long-term and short-term complications. Reviewed signs and symptoms of hyperglycemia and hypoglycemia and how to treat hypoglycemia at home. Also reviewed blood sugar goals at home.  RNs to provide ongoing basic DM education at bedside with this patient. Noted has met with dietician. Patient shared that she has not been eating healthy like she should over the past few weeks but states willingness to regroup and eat healthier. Answered questions regarding nutrition.  Thank you, Nani Gasser. Prentis Langdon, RN, MSN, CDE  Diabetes Coordinator Inpatient Glycemic Control Team Team Pager [phone] (8am-5pm) 07/26/2017 2:09 PM

## 2017-07-27 ENCOUNTER — Inpatient Hospital Stay (HOSPITAL_COMMUNITY): Payer: Medicare Other | Admitting: Anesthesiology

## 2017-07-27 ENCOUNTER — Encounter (HOSPITAL_COMMUNITY): Payer: Self-pay | Admitting: Plastic Surgery

## 2017-07-27 ENCOUNTER — Encounter (HOSPITAL_COMMUNITY)
Admission: EM | Disposition: A | Discharge status: Home / Self Care | Payer: Self-pay | Source: Home / Self Care | Attending: Family Medicine

## 2017-07-27 DIAGNOSIS — I96 Gangrene, not elsewhere classified: Secondary | ICD-10-CM | POA: Diagnosis present

## 2017-07-27 DIAGNOSIS — S61402A Unspecified open wound of left hand, initial encounter: Secondary | ICD-10-CM

## 2017-07-27 DIAGNOSIS — R0602 Shortness of breath: Secondary | ICD-10-CM

## 2017-07-27 HISTORY — PX: I & D EXTREMITY: SHX5045

## 2017-07-27 HISTORY — PX: APPLICATION OF A-CELL OF EXTREMITY: SHX6303

## 2017-07-27 LAB — GLUCOSE, CAPILLARY
GLUCOSE-CAPILLARY: 87 mg/dL (ref 65–99)
GLUCOSE-CAPILLARY: 192 mg/dL — AB (ref 65–99)
GLUCOSE-CAPILLARY: 327 mg/dL — AB (ref 65–99)
Glucose-Capillary: 106 mg/dL — ABNORMAL HIGH (ref 65–99)
Glucose-Capillary: 116 mg/dL — ABNORMAL HIGH (ref 65–99)
Glucose-Capillary: 135 mg/dL — ABNORMAL HIGH (ref 65–99)

## 2017-07-27 LAB — CBC
HEMATOCRIT: 41.8 % (ref 36.0–46.0)
Hemoglobin: 13.1 g/dL (ref 12.0–15.0)
MCH: 30.8 pg (ref 26.0–34.0)
MCHC: 31.3 g/dL (ref 30.0–36.0)
MCV: 98.4 fL (ref 78.0–100.0)
Platelets: 213 10*3/uL (ref 150–400)
RBC: 4.25 MIL/uL (ref 3.87–5.11)
RDW: 13.5 % (ref 11.5–15.5)
WBC: 13.3 10*3/uL — ABNORMAL HIGH (ref 4.0–10.5)

## 2017-07-27 LAB — AEROBIC CULTURE  (SUPERFICIAL SPECIMEN)

## 2017-07-27 LAB — AEROBIC CULTURE W GRAM STAIN (SUPERFICIAL SPECIMEN)

## 2017-07-27 SURGERY — IRRIGATION AND DEBRIDEMENT EXTREMITY
Anesthesia: General | Site: Hand | Laterality: Left

## 2017-07-27 MED ORDER — MIDAZOLAM HCL 2 MG/2ML IJ SOLN
INTRAMUSCULAR | Status: DC | PRN
  Administered 2017-07-27: 2 mg via INTRAVENOUS

## 2017-07-27 MED ORDER — MIDAZOLAM HCL 2 MG/2ML IJ SOLN
INTRAMUSCULAR | Status: AC
  Filled 2017-07-27: qty 2

## 2017-07-27 MED ORDER — LACTATED RINGERS IV SOLN
INTRAVENOUS | Status: DC | PRN
  Administered 2017-07-27 (×2): via INTRAVENOUS

## 2017-07-27 MED ORDER — FENTANYL CITRATE (PF) 100 MCG/2ML IJ SOLN: 25.0000 ug | INTRAMUSCULAR | Status: DC | PRN

## 2017-07-27 MED ORDER — FENTANYL CITRATE (PF) 100 MCG/2ML IJ SOLN
INTRAMUSCULAR | Status: DC | PRN
  Administered 2017-07-27: 25 ug via INTRAVENOUS

## 2017-07-27 MED ORDER — SODIUM CHLORIDE 0.9 % IR SOLN
Status: DC | PRN
  Administered 2017-07-27: 1000 mL

## 2017-07-27 MED ORDER — PROPOFOL 10 MG/ML IV BOLUS
INTRAVENOUS | Status: AC
  Filled 2017-07-27: qty 20

## 2017-07-27 MED ORDER — ONDANSETRON HCL 4 MG/2ML IJ SOLN
INTRAMUSCULAR | Status: DC | PRN
  Administered 2017-07-27: 4 mg via INTRAVENOUS

## 2017-07-27 MED ORDER — PROPOFOL 10 MG/ML IV BOLUS
INTRAVENOUS | Status: DC | PRN
  Administered 2017-07-27: 170 mg via INTRAVENOUS

## 2017-07-27 MED ORDER — SODIUM CHLORIDE 0.9 % IV SOLN
INTRAVENOUS | Status: DC | PRN
  Administered 2017-07-27: 500 mL

## 2017-07-27 MED ORDER — SUCCINYLCHOLINE CHLORIDE 200 MG/10ML IV SOSY
PREFILLED_SYRINGE | INTRAVENOUS | Status: DC | PRN
  Administered 2017-07-27: 120 mg via INTRAVENOUS

## 2017-07-27 MED ORDER — PHENYLEPHRINE 40 MCG/ML (10ML) SYRINGE FOR IV PUSH (FOR BLOOD PRESSURE SUPPORT)
PREFILLED_SYRINGE | INTRAVENOUS | Status: DC | PRN
  Administered 2017-07-27: 160 ug via INTRAVENOUS
  Administered 2017-07-27: 120 ug via INTRAVENOUS

## 2017-07-27 MED ORDER — METOCLOPRAMIDE HCL 5 MG/ML IJ SOLN: 10.0000 mg | Freq: Once | INTRAMUSCULAR | Status: DC | PRN

## 2017-07-27 MED ORDER — FENTANYL CITRATE (PF) 250 MCG/5ML IJ SOLN
INTRAMUSCULAR | Status: AC
  Filled 2017-07-27: qty 5

## 2017-07-27 MED ORDER — DEXAMETHASONE SODIUM PHOSPHATE 10 MG/ML IJ SOLN
INTRAMUSCULAR | Status: DC | PRN
  Administered 2017-07-27: 10 mg via INTRAVENOUS

## 2017-07-27 MED ORDER — MEPERIDINE HCL 25 MG/ML IJ SOLN: 6.2500 mg | INTRAMUSCULAR | Status: DC | PRN

## 2017-07-27 MED ORDER — LIDOCAINE 2% (20 MG/ML) 5 ML SYRINGE
INTRAMUSCULAR | Status: DC | PRN
  Administered 2017-07-27: 100 mg via INTRAVENOUS

## 2017-07-27 SURGICAL SUPPLY — 56 items
BANDAGE ACE 3X5.8 VEL STRL LF (GAUZE/BANDAGES/DRESSINGS) ×3 IMPLANT
BANDAGE ACE 4X5 VEL STRL LF (GAUZE/BANDAGES/DRESSINGS) ×3 IMPLANT
BLADE CLIPPER SURG (BLADE) IMPLANT
BNDG GAUZE ELAST 4 BULKY (GAUZE/BANDAGES/DRESSINGS) ×6 IMPLANT
CANISTER SUCT 3000ML PPV (MISCELLANEOUS) IMPLANT
CANISTER WOUND CARE 500ML ATS (WOUND CARE) IMPLANT
CHLORAPREP W/TINT 26ML (MISCELLANEOUS) IMPLANT
CONT SPEC 4OZ CLIKSEAL STRL BL (MISCELLANEOUS) ×3 IMPLANT
COVER SURGICAL LIGHT HANDLE (MISCELLANEOUS) ×3 IMPLANT
DRAPE HALF SHEET 40X57 (DRAPES) IMPLANT
DRAPE INCISE IOBAN 66X45 STRL (DRAPES) IMPLANT
DRAPE ORTHO SPLIT 77X108 STRL (DRAPES)
DRAPE SURG ORHT 6 SPLT 77X108 (DRAPES) IMPLANT
DRESSING HYDROCOLLOID 4X4 (GAUZE/BANDAGES/DRESSINGS) IMPLANT
DRSG ADAPTIC 3X8 NADH LF (GAUZE/BANDAGES/DRESSINGS) IMPLANT
DRSG CUTIMED SORBACT 7X9 (GAUZE/BANDAGES/DRESSINGS) ×3 IMPLANT
DRSG PAD ABDOMINAL 8X10 ST (GAUZE/BANDAGES/DRESSINGS) IMPLANT
DRSG VAC ATS LRG SENSATRAC (GAUZE/BANDAGES/DRESSINGS) IMPLANT
DRSG VAC ATS MED SENSATRAC (GAUZE/BANDAGES/DRESSINGS) IMPLANT
DRSG VAC ATS SM SENSATRAC (GAUZE/BANDAGES/DRESSINGS) IMPLANT
ELECT CAUTERY BLADE 6.4 (BLADE) ×3 IMPLANT
ELECT REM PT RETURN 9FT ADLT (ELECTROSURGICAL) ×3
ELECTRODE REM PT RTRN 9FT ADLT (ELECTROSURGICAL) ×1 IMPLANT
GAUZE SPONGE 4X4 12PLY STRL (GAUZE/BANDAGES/DRESSINGS) ×3 IMPLANT
GEL ULTRASOUND 20GR AQUASONIC (MISCELLANEOUS) ×3 IMPLANT
GLOVE BIO SURGEON STRL SZ 6.5 (GLOVE) ×6 IMPLANT
GLOVE BIO SURGEONS STRL SZ 6.5 (GLOVE) ×3
GOWN STRL REUS W/ TWL LRG LVL3 (GOWN DISPOSABLE) ×2 IMPLANT
GOWN STRL REUS W/TWL LRG LVL3 (GOWN DISPOSABLE) ×4
HANDPIECE INTERPULSE COAX TIP (DISPOSABLE)
KIT BASIN OR (CUSTOM PROCEDURE TRAY) ×3 IMPLANT
KIT ROOM TURNOVER OR (KITS) ×3 IMPLANT
MATRIX WOUND 3-LAYER 7X10 (Tissue) ×2 IMPLANT
MICROMATRIX 1000MG (Tissue) ×3 IMPLANT
NS IRRIG 1000ML POUR BTL (IV SOLUTION) ×3 IMPLANT
PACK GENERAL/GYN (CUSTOM PROCEDURE TRAY) ×3 IMPLANT
PACK ORTHO EXTREMITY (CUSTOM PROCEDURE TRAY) ×3 IMPLANT
PAD ABD 8X10 STRL (GAUZE/BANDAGES/DRESSINGS) ×3 IMPLANT
PAD ARMBOARD 7.5X6 YLW CONV (MISCELLANEOUS) ×6 IMPLANT
PAD NEG PRESSURE SENSATRAC (MISCELLANEOUS) IMPLANT
SET HNDPC FAN SPRY TIP SCT (DISPOSABLE) IMPLANT
SOLUTION PARTIC MCRMTRX 1000MG (Tissue) ×1 IMPLANT
SPLINT FIBERGLASS 3X12 (CAST SUPPLIES) ×3 IMPLANT
STAPLER VISISTAT 35W (STAPLE) IMPLANT
STOCKINETTE IMPERVIOUS 9X36 MD (GAUZE/BANDAGES/DRESSINGS) IMPLANT
STOCKINETTE IMPERVIOUS LG (DRAPES) IMPLANT
SUT SILK 4 0 P 3 (SUTURE) IMPLANT
SUT SILK 4 0 PS 2 (SUTURE) IMPLANT
SUT VIC AB 5-0 PS2 18 (SUTURE) ×6 IMPLANT
TOWEL OR 17X24 6PK STRL BLUE (TOWEL DISPOSABLE) ×3 IMPLANT
TOWEL OR 17X26 10 PK STRL BLUE (TOWEL DISPOSABLE) ×3 IMPLANT
TUBE CONNECTING 12'X1/4 (SUCTIONS) ×1
TUBE CONNECTING 12X1/4 (SUCTIONS) ×2 IMPLANT
UNDERPAD 30X30 (UNDERPADS AND DIAPERS) IMPLANT
WOUND MATRIX 3-LAYER 7X10 (Tissue) ×1 IMPLANT
YANKAUER SUCT BULB TIP NO VENT (SUCTIONS) ×3 IMPLANT

## 2017-07-27 NOTE — Transfer of Care (Signed)
Immediate Anesthesia Transfer of Care Note  Patient: Sylvia Craig  Procedure(s) Performed: Procedure(s): IRRIGATION AND DEBRIDEMENT OF LEFT HAND (Left) APPLICATION OF A-CELL (Left)  Patient Location: PACU  Anesthesia Type:General  Level of Consciousness: drowsy and patient cooperative  Airway & Oxygen Therapy: Patient Spontanous Breathing and Patient connected to nasal cannula oxygen  Post-op Assessment: Report given to RN, Post -op Vital signs reviewed and stable and Patient moving all extremities X 4  Post vital signs: Reviewed and stable  Last Vitals:  Vitals:   07/27/17 0715 07/27/17 0916  BP: (!) 126/41 (!) 87/58  Pulse: 80 77  Resp:  (!) 21  Temp:  (!) 36.1 C    Last Pain:  Vitals:   07/27/17 0715  TempSrc:   PainSc: 0-No pain      Patients Stated Pain Goal: 0 (07/26/17 0522)  Complications: No apparent anesthesia complications

## 2017-07-27 NOTE — Interval H&P Note (Signed)
History and Physical Interval Note:  07/27/2017 8:24 AM  Sylvia Craig  has presented today for surgery, with the diagnosis of LEFT HAND WOUND   The various methods of treatment have been discussed with the patient and family. After consideration of risks, benefits and other options for treatment, the patient has consented to  Procedure(s): IRRIGATION AND DEBRIDEMENT OF LEFT HAND (Left) APPLICATION OF A-CELL (Left) as a surgical intervention .  The patient's history has been reviewed, patient examined, no change in status, stable for surgery.  I have reviewed the patient's chart and labs.  Questions were answered to the patient's satisfaction.     Peggye FormLAIRE S DILLINGHAM

## 2017-07-27 NOTE — Progress Notes (Signed)
Occupational Therapy Treatment Patient Details Name: Sylvia NightDebbie Suchy MRN: [id number] DOB: [birth year] Today's Date: 07/27/2017    History of present illness This 66 y.o. female admitted with Lt hand infection secondary to soft tissue injury that occurred during a fall ~10 days PTA.  She underwent debridement of Lt hand by ortho on 7/29.  PMH includes:   CHF, DM, HTN   OT comments  Pt now with wrist and hand immobillized.  Reinforced need for AROM shoulder and elbow as well as elevation for edema management.  She is unsteady when standing, and only able to tolerate transfer to chair this pm - required min A.  BP sitting EOB 115/115; standing 135/95.    Recommend PT consult   Follow Up Recommendations  Outpatient OT;Supervision/Assistance - 24 hour    Equipment Recommendations  3 in 1 bedside commode;Other (comment)    Recommendations for Other Services PT consult    Precautions / Restrictions Precautions Precautions: Fall       Mobility Bed Mobility Overal bed mobility: Needs Assistance Bed Mobility: Supine to Sit     Supine to sit: Supervision;HOB elevated        Transfers Overall transfer level: Needs assistance Equipment used: 1 person hand held assist Transfers: Sit to/from Stand;Stand Pivot Transfers Sit to Stand: Min assist Stand pivot transfers: Min assist       General transfer comment: min A for balance     Balance Overall balance assessment: Needs assistance Sitting-balance support: Feet supported Sitting balance-Leahy Scale: Good     Standing balance support: Single extremity supported Standing balance-Leahy Scale: Poor Standing balance comment: requires min A for static standing                            ADL either performed or assessed with clinical judgement   ADL                       Lower Body Dressing: Maximal assistance;Sit to/from stand   Toilet Transfer: Minimal assistance;Stand-pivot;BSC   Toileting- Clothing  Manipulation and Hygiene: Moderate assistance;Sit to/from stand       Functional mobility during ADLs: Minimal assistance General ADL Comments: Pt is very unsteady post op      Vision       Perception     Praxis      Cognition Arousal/Alertness: Awake/alert Behavior During Therapy: WFL for tasks assessed/performed Overall Cognitive Status: Within Functional Limits for tasks assessed                                          Exercises Other Exercises Other Exercises: encouraged AROM shoulder and elbow.  Wrist and hand now immobilized    Shoulder Instructions       General Comments BP sitting EOB 155/115; standing 139/95; HR 109-129    Pertinent Vitals/ Pain       Pain Assessment: Faces Faces Pain Scale: Hurts little more Pain Location: Lt hand  Pain Descriptors / Indicators: Sore Pain Intervention(s): Monitored during session  Home Living                                          Prior Functioning/Environment  Frequency  Min 3X/week        Progress Toward Goals  OT Goals(current goals can now be found in the care plan section)  Progress towards OT goals: Not progressing toward goals - comment (Lt hand immobilized )     Plan Discharge plan remains appropriate    Co-evaluation                 AM-PAC PT "6 Clicks" Daily Activity     Outcome Measure   Help from another person eating meals?: A Little Help from another person taking care of personal grooming?: A Little Help from another person toileting, which includes using toliet, bedpan, or urinal?: A Lot Help from another person bathing (including washing, rinsing, drying)?: A Lot Help from another person to put on and taking off regular upper body clothing?: A Lot Help from another person to put on and taking off regular lower body clothing?: A Lot 6 Click Score: 14    End of Session Equipment Utilized During Treatment: Oxygen  OT Visit  Diagnosis: Unsteadiness on feet (R26.81);Muscle weakness (generalized) (M62.81)   Activity Tolerance Patient tolerated treatment well   Patient Left in chair;with call bell/phone within reach   Nurse Communication Mobility status        Time: [id number] OT Time Calculation (min): 51 min  Charges: OT General Charges $OT Visit: 1 Procedure OT Treatments $Therapeutic Activity: 38-52 mins  Reynolds AmericanWendi Hesston Hitchens, OTR/L [phone]    Jeani HawkingConarpe, Lucy Boardman M 07/27/2017, 5:50 PM

## 2017-07-27 NOTE — H&P (View-Only) (Signed)
Reason for Consult: hand wound Referring Physician: Dr. Timothy Murphy  Sylvia Craig is an 66 y.o. female.  HPI: The patient is a 66 yrs old wf here for treatment of her left hand wound / infection.  Patient states that one week ago she fell and caught her left hand on the hardware of the cabinet.  She noted a wound but thought it would be ok.  Yesterday the swelling and pain was severe and she felt ill.  She has multiple medical conditions including DM, CHF, A fib and thyroid disease.  She was taken to the OR for debridement and is now on the unit receiving IV antibiotics.  The wound is on the dorsal aspect of the hand and is 4 x 6 cm in size.  There is a small area of tendon exposed.  There is proximal redness and swelling but it appears to be improving.  She is trying to keep her hand elevated.  Past Medical History:  Diagnosis Date  . CHF (congestive heart failure) (HCC)   . Diabetes mellitus without complication (HCC)   . Hypertension   . Iron (Fe) deficiency anemia   . Thyroid disease     Past Surgical History:  Procedure Laterality Date  . I&D EXTREMITY Left 07/24/2017   Procedure: IRRIGATION AND DEBRIDEMENT EXTREMITY;  Surgeon: Murphy, Timothy D, MD;  Location: [facility] OR;  Service: Orthopedics;  Laterality: Left;    History reviewed. No pertinent family history.  Social History:  reports that she quit smoking about 29 years ago. Her smoking use included Cigarettes. She smoked 1.00 pack per day. She has never used smokeless tobacco. She reports that she does not drink alcohol or use drugs.  Allergies:  Allergies  Allergen Reactions  . Shellfish-Derived Products Shortness Of Breath  . Codeine Other (See Comments)    Rash, edema  . Atorvastatin Other (See Comments)    Myalgias   . Lisinopril Other (See Comments)    Cough   . Other Other (See Comments)    Crab, chest tightness  . Oxycodone-Aspirin Other (See Comments)    Rash, dreams    Medications: I have reviewed the  patient's current medications.  Results for orders placed or performed during the hospital encounter of 07/24/17 (from the past 48 hour(s))  CBC with Differential     Status: Abnormal   Collection Time: 07/24/17  1:13 PM  Result Value Ref Range   WBC 14.0 (H) 4.0 - 10.5 K/uL   RBC 4.54 3.87 - 5.11 MIL/uL   Hemoglobin 13.8 12.0 - 15.0 g/dL   HCT 43.0 36.0 - 46.0 %   MCV 94.7 78.0 - 100.0 fL   MCH 30.4 26.0 - 34.0 pg   MCHC 32.1 30.0 - 36.0 g/dL   RDW 13.3 11.5 - 15.5 %   Platelets 270 150 - 400 K/uL   Neutrophils Relative % 83 %   Neutro Abs 11.6 (H) 1.7 - 7.7 K/uL   Lymphocytes Relative 8 %   Lymphs Abs 1.1 0.7 - 4.0 K/uL   Monocytes Relative 7 %   Monocytes Absolute 1.0 0.1 - 1.0 K/uL   Eosinophils Relative 2 %   Eosinophils Absolute 0.3 0.0 - 0.7 K/uL   Basophils Relative 0 %   Basophils Absolute 0.0 0.0 - 0.1 K/uL  Basic metabolic panel     Status: Abnormal   Collection Time: 07/24/17  1:13 PM  Result Value Ref Range   Sodium 137 135 - 145 mmol/L   Potassium 4.9 3.5 -   5.1 mmol/L   Chloride 101 101 - 111 mmol/L   CO2 26 22 - 32 mmol/L   Glucose, Bld 274 (H) 65 - 99 mg/dL   BUN 40 (H) 6 - 20 mg/dL   Creatinine, Ser 1.50 (H) 0.44 - 1.00 mg/dL   Calcium 9.7 8.9 - 10.3 mg/dL   GFR calc non Af Amer 35 (L) >60 mL/min   GFR calc Af Amer 40 (L) >60 mL/min    Comment: (NOTE) The eGFR has been calculated using the CKD EPI equation. This calculation has not been validated in all clinical situations. eGFR's persistently <60 mL/min signify possible Chronic Kidney Disease.    Anion gap 10 5 - 15  CBG monitoring, ED     Status: Abnormal   Collection Time: 07/24/17  1:32 PM  Result Value Ref Range   Glucose-Capillary 250 (H) 65 - 99 mg/dL  Blood Culture (routine x 2)     Status: None (Preliminary result)   Collection Time: 07/24/17  2:01 PM  Result Value Ref Range   Specimen Description BLOOD RIGHT FOREARM    Special Requests      IN PEDIATRIC BOTTLE Blood Culture results may  not be optimal due to an excessive volume of blood received in culture bottles   Culture NO GROWTH < 24 HOURS    Report Status PENDING   Blood Culture (routine x 2)     Status: None (Preliminary result)   Collection Time: 07/24/17  2:08 PM  Result Value Ref Range   Specimen Description BLOOD RIGHT ANTECUBITAL    Special Requests      BOTTLES DRAWN AEROBIC AND ANAEROBIC Blood Culture adequate volume   Culture NO GROWTH < 24 HOURS    Report Status PENDING   I-Stat CG4 Lactic Acid, ED  (not at  [facility])     Status: None   Collection Time: 07/24/17  2:29 PM  Result Value Ref Range   Lactic Acid, Venous 1.33 0.5 - 1.9 mmol/L  Urinalysis, Routine w reflex microscopic     Status: Abnormal   Collection Time: 07/24/17  5:52 PM  Result Value Ref Range   Color, Urine YELLOW YELLOW   APPearance CLEAR CLEAR   Specific Gravity, Urine 1.012 1.005 - 1.030   pH 6.0 5.0 - 8.0   Glucose, UA 50 (A) NEGATIVE mg/dL   Hgb urine dipstick NEGATIVE NEGATIVE   Bilirubin Urine NEGATIVE NEGATIVE   Ketones, ur NEGATIVE NEGATIVE mg/dL   Protein, ur 100 (A) NEGATIVE mg/dL   Nitrite POSITIVE (A) NEGATIVE   Leukocytes, UA LARGE (A) NEGATIVE   RBC / HPF 0-5 0 - 5 RBC/hpf   WBC, UA TOO NUMEROUS TO COUNT 0 - 5 WBC/hpf   Bacteria, UA RARE (A) NONE SEEN   Squamous Epithelial / LPF 0-5 (A) NONE SEEN  Aerobic Culture (superficial specimen)     Status: None (Preliminary result)   Collection Time: 07/24/17  7:30 PM  Result Value Ref Range   Specimen Description TISSUE LEFT HAND    Special Requests PATIENT ON FOLLOWING VANCOMYCIN ZOCYN    Gram Stain      RARE WBC PRESENT,BOTH PMN AND MONONUCLEAR FEW GRAM POSITIVE COCCI FEW GRAM POSITIVE RODS RARE GRAM NEGATIVE RODS    Culture CULTURE REINCUBATED FOR BETTER GROWTH    Report Status PENDING   Glucose, capillary     Status: None   Collection Time: 07/24/17  7:51 PM  Result Value Ref Range   Glucose-Capillary 90 65 - 99 mg/dL  Glucose,  capillary     Status: Abnormal    Collection Time: 07/24/17 11:57 PM  Result Value Ref Range   Glucose-Capillary 171 (H) 65 - 99 mg/dL   Comment 1 Notify RN    Comment 2 Document in Chart   Glucose, capillary     Status: Abnormal   Collection Time: 07/25/17  5:58 AM  Result Value Ref Range   Glucose-Capillary 140 (H) 65 - 99 mg/dL   Comment 1 Notify RN    Comment 2 Document in Chart   Basic metabolic panel     Status: Abnormal   Collection Time: 07/25/17  7:35 AM  Result Value Ref Range   Sodium 138 135 - 145 mmol/L   Potassium 5.1 3.5 - 5.1 mmol/L   Chloride 105 101 - 111 mmol/L   CO2 25 22 - 32 mmol/L   Glucose, Bld 138 (H) 65 - 99 mg/dL   BUN 35 (H) 6 - 20 mg/dL   Creatinine, Ser 1.43 (H) 0.44 - 1.00 mg/dL   Calcium 9.0 8.9 - 10.3 mg/dL   GFR calc non Af Amer 37 (L) >60 mL/min   GFR calc Af Amer 43 (L) >60 mL/min    Comment: (NOTE) The eGFR has been calculated using the CKD EPI equation. This calculation has not been validated in all clinical situations. eGFR's persistently <60 mL/min signify possible Chronic Kidney Disease.    Anion gap 8 5 - 15  CBC     Status: Abnormal   Collection Time: 07/25/17  7:35 AM  Result Value Ref Range   WBC 12.2 (H) 4.0 - 10.5 K/uL   RBC 4.03 3.87 - 5.11 MIL/uL   Hemoglobin 12.5 12.0 - 15.0 g/dL   HCT 39.2 36.0 - 46.0 %   MCV 97.3 78.0 - 100.0 fL   MCH 31.0 26.0 - 34.0 pg   MCHC 31.9 30.0 - 36.0 g/dL   RDW 13.5 11.5 - 15.5 %   Platelets 245 150 - 400 K/uL  Glucose, capillary     Status: Abnormal   Collection Time: 07/25/17 11:18 AM  Result Value Ref Range   Glucose-Capillary 190 (H) 65 - 99 mg/dL   Comment 1 Notify RN   Glucose, capillary     Status: Abnormal   Collection Time: 07/25/17  3:46 PM  Result Value Ref Range   Glucose-Capillary 243 (H) 65 - 99 mg/dL   Comment 1 Notify RN     Dg Chest 2 View  Result Date: 07/24/2017 CLINICAL DATA:  Patient status post fall. EXAM: CHEST  2 VIEW COMPARISON:  None. FINDINGS: Monitoring leads overlie the patient.  Cardiomegaly. No consolidative pulmonary opacities. No pleural effusion or pneumothorax. Thoracic spine degenerative changes. IMPRESSION: Cardiomegaly.  No acute cardiopulmonary process. Electronically Signed   By: Drew  Davis M.D.   On: 07/24/2017 16:58   Dg Chest Port 1 View  Result Date: 07/24/2017 CLINICAL DATA:  Shortness of breath.  Postoperative patient. EXAM: PORTABLE CHEST 1 VIEW COMPARISON:  07/24/2017 FINDINGS: Shallow inspiration with atelectasis in the lung bases. Cardiac enlargement. Pulmonary vascularity is normal for technique. No consolidation or edema. No blunting of costophrenic angles. No pneumothorax. Calcification of the aorta. Old healed fracture deformity of the left clavicle. IMPRESSION: Shallow inspiration with atelectasis in the lung bases. Cardiac enlargement. No consolidation. Electronically Signed   By: William  Stevens M.D.   On: 07/24/2017 21:27   Dg Hand Complete Left  Result Date: 07/24/2017 CLINICAL DATA:  Patient status post fall. Left hand wound around the   posterior aspect of the carpal bones. Initial encounter. EXAM: LEFT HAND - COMPLETE 3+ VIEW COMPARISON:  None. FINDINGS: Soft tissue defect overlying the dorsal aspect of the hand. Vascular calcifications. Normal anatomic alignment. No evidence for acute fracture or dislocation. IMPRESSION: No acute osseous abnormality. Soft tissue injury overlying the dorsal aspect of the hand. Electronically Signed   By: Lovey Newcomer M.D.   On: 07/24/2017 16:58    Review of Systems  Constitutional: Positive for fever.  HENT: Negative.   Eyes: Negative.   Respiratory: Negative.   Cardiovascular: Negative.   Gastrointestinal: Negative.   Genitourinary: Negative.   Musculoskeletal: Negative.   Neurological: Positive for weakness.  Psychiatric/Behavioral: Negative.    Blood pressure (!) 98/56, pulse 78, temperature 97.9 F (36.6 C), temperature source Oral, resp. rate 19, height 5' 5" (1.651 m), weight 126.7 kg (279 lb 4.8  oz), SpO2 98 %. Physical Exam  Constitutional: She is oriented to person, place, and time. She appears well-developed.  HENT:  Head: Normocephalic and atraumatic.  Eyes: Pupils are equal, round, and reactive to light. EOM are normal.  Cardiovascular: Normal rate.   Respiratory: Effort normal.  Musculoskeletal: She exhibits edema and tenderness.  Neurological: She is alert and oriented to person, place, and time.  Psychiatric: She has a normal mood and affect.    Assessment/Plan: Recommend multivitamin as able with Vit C 500 mg daily, Zinc 220 mg daily and increase protein as kidney function permits.  Diabetic counseling and nutrition consult while inpatient.  Keep hand elevated as much as possible.  Continue with wet to dry dressing changes 3 times a day.  Will try to get patient on schedule for Wed or Thursday for debridement and possible Acell / VAC placement.  Will check HgA1C and Prealbumin.  Wallace Going 07/25/2017, 5:46 PM

## 2017-07-27 NOTE — Op Note (Signed)
DATE OF OPERATION: 07/27/2017  [facility]  PREOPERATIVE DIAGNOSIS: Left Hand wound  POSTOPERATIVE DIAGNOSIS: Same  PROCEDURE:  1. Excisional debridement of the left hand 5 x 7 cm 2. Preparation of left hand wound for placement of Acell (powder 1 gm and Sheet 7 x 10 cm)  SURGEON: Ricquel Foulk Sanger Diana Davenport, DO  ASSISTANT: Shawn Rayburn, PA  EBL: less than 10 cc  CONDITION: Stable  COMPLICATIONS: None  INDICATION: The patient, Sylvia Craig, is a 66 y.o. female born on 1951-06-05, is here for treatment of a large wound on the left hand.  The patient fell at home last week and sustained a laceration injury to the left dorsal hand.  It got red, swollen and infected so she came to the ED.  She was admitted and taken to the operating room for debridement.  She has been on IV antibiotics.  PROCEDURE DETAILS:  The patient was seen prior to surgery and marked.  The IV antibiotics were given. The patient was taken to the operating room and given a general anesthetic. A standard time out was performed and all information was confirmed by those in the room. SCDs were placed.   The left hand and arm were prepped and draped in the usual sterile fashion.  The hand was irrigated with antibiotic solution and saline.  The #10 blade was used to debride the skin and soft tissue at the dorsal aspect of the hand 5 x 7 cm.  Hemostasis was achieved with electrocautery.  All of the Acell powder and 5 x 7 cm of the sheet were applied and secured in place with 5-0 Vicryl.  Sorbact was applied and secured with the vicryl.  KY gel and gauze was applied.  The hand was splinted and wrapped with kerlex and an ace wrap with a splint.  The patient was allowed to wake up and taken to recovery room in stable condition at the end of the case. The family was notified at the end of the case.

## 2017-07-27 NOTE — Progress Notes (Addendum)
Family Medicine Teaching Service Daily Progress Note Intern Pager: [phone]  Patient name: Sylvia Craig Medical record number: [id number] Date of birth: Apr 05, 1951 Age: 66 y.o. Gender: female  Primary Care Provider: System, Pcp Not In Consultants: hand surgery  Code Status: Full   Pt Overview and Major Events to Date:  [name] a 66 y.o.female who presented on 7/29with left hand infection 2/2 mechanical fall 10 days prior to admission. [facility] S/p Left hand debridement x2 on 7/29 and 8/01.  Assessment and Plan: [name] a 66 y.o.female who presented on 7/29with left hand infection 2/2 mechanical fall 10 days prior to admission. PMH is significant for T2DM, HTN, HLD, morbid obesity, vitamin D deficiency, CKD stage 3, iron deficiency anemia, [name] s/p triple vessel cath in 2015 with no stents, CHF NYHA class 2, a fib, and hypothyroidism.  Left hand infection  2/2 mechanical fall. Hand surgery was consulted and performed debridement on 7/29. Currently afebrile. Mild leukocytosis persistent at 13.3. Xray of hand showed no acute osseous abnormality, and soft tissue injury overlying the dorsal aspect of the hand. CXR showing cardiomegaly, and no acute cardiopulmonary process with no fractures (patient reports hitting left chest in fall).Patient given IV vancomycin and IV Zosyn in ED with Code Sepsis order set and started on Ancef by [facility]PTS on 7/29. Preliminary blood cultures showing few gram positive cocci, few gram positive rods, and rare gram negative rods. Show no growth at day 5. Lactic acid 1.33. Wound care consulted prior to debridement and have referred care to hand surgery. Plastic surgery recommendations of Vit C 500 mg daily, Zinc 220 mg daily, increasing protein in diet as tolerated by kidneys, diabetic counseling, and nutrition counseling, elevation of hand, and continued wet to dry dressing changes tid. Plastic surgery performed additional debridement on  8/01. Patient today states improvement. Denies pain in hand. States no fever or chills. No swelling or erythema on exam.  -appreciate hand and plastic surgery recommendations  -appreciate wound care recommendations  -blood cultures pending, no growth at 2 days -continue cefazolin 2 g IV, consider changing once final blood culture and sensitivities result  -Restarted Eliquis per Aquilla HackerHenry Martensen, PA (hand surgery) at 2.5 mg.  -continuous pulse ox  -tylenol 1000 mg q6hrs prn pain   Respiratory distress Likely 2/2 fluid overload due to NS boluses from code sepsis. Patient given 40 mg IV lasix by PACU. Repeat CXR showing no frank edema, and shallow inspiration with atelectasis. Atrovent neg given in PACU. Patient currently satting at 97% on 2L via nasal canula with RR of 17. Current weight 279 lb (dry weight 266 lbs). Patient denies SOB, but endorses cough without sputum production. -strict I's and O's via foley  -continue po lasix 40 mg  -wean off O2  Skin irritation Skin irritation of bilaterally inner thighs. Patient states it is not bothering her.  -continue desitin PRN  T2DM Current CBG of 86, improved from mid 150s o/n. Current A1C of 9.7. Home humalog 6 units TID with meals, Amaryl 4mg  BID, lantus 26 units daily.  -monitor CBGs ACHS -lantus to 18 units at bedtime -moderate SSI  HTN BP of 113/75. Home meds are toprol XL 100mg , cozaar 25 mg. Will dose metoprolol 50mg  BID to avoid rebound tachycardia as BP can tolerate.  -metoprolol immediate release 50 mg bid -continue to monitor   HFpEF Echo 2015 showing EF 45%, Cath 2015 showing EF 35%. Weight on 06/28/2016 266 lb 6.4 oz. Weight on admission of 267  lbs. Current weight 279 lbs. Followed by Dr. Tami Ribas [facility], [place]). Home lasix 40 mg BID and losartan 25 mg. Patient initially ordered for 30mg /kg NS boluses, but upon discovery of reduced EF, patient had received 2L NS boluses and additional boluses were discontinued. Patient was  euvolemic on exam.  -continue lasix 40 mg po to start 7/30 -monitor fluid status -I's and O's -consider restarting losartan at discharge   Atrial fibrillation  On home Eliquis 2.5 mg and metoprolol. Unable to appreciate why Eliquis is dosed at 2.5mg  BID (per pharmacy patient would have to have 2 of 3: age >39, Cr >1.5, and <100kg). Patient has one of these criteria (Cr >1.5 in recent past). Patient self discontinued Eliquis since the fall on 7/19.  -Re start Elliquis per Aquilla Hacker, PA (hand surgery)  HLD Home crestor 10 mg -continue home crestor  CKD stage 3 Current Cr 1.59. Per chart review baseline ~1.4.  -continue to monitor   Iron deficiency anemia  Current CBC wnl showing Hgb 12.9, Hct 40.8, MCV 97.6. Home iron supplementation.  -no supplementation during acute illness  [name]  Home nitro prn, metoprolol, and crestor. Patient denies current CP.  -continue nitro prn -metoprolol immediate release 50 mg bid  Hypothyroidism  Home synthroid 137 mcg -continue home synthroid   FEN/GI: carb modified  PPx: Eliquis 2.5 mg bid  Disposition: dc home with outpatient OT, PT  Subjective:  Patient today states she is improved. Patient denies pain in hand. States slight headache last night that was alleviated with prn Tylenol. Patient denies fever/chills. Patient denies SOB. Patient has cough with no sputum production. States OT is improving hand functionality and pain.   Objective: Temp:  [97 F (36.1 C)-98.4 F (36.9 C)] 97.5 F (36.4 C) (08/01 1103) Pulse Rate:  [73-99] 88 (08/01 1103) Resp:  [18-25] 21 (08/01 1103) BP: (87-147)/(41-96) 130/71 (08/01 1103) SpO2:  [89 %-98 %] 97 % (08/01 1103) Weight:  [291 lb 11.2 oz (132.3 kg)] 291 lb 11.2 oz (132.3 kg) (08/01 0400) Physical Exam: General: awake and alert, NAD, lying in bed  Cardiovascular: RRR, no MRG Respiratory: CTAB, no wheezes, rales, or rhonchi  Abdomen: soft, non tender, non distended, bowel sounds x 4  quadrants  Extremities: soft, no edema, 2 sec capillary refill bilaterally, left arm wrapped in dressing.   Laboratory:  Recent Labs Lab 07/25/17 0735 07/26/17 0312 07/27/17 1018  WBC 12.2* 13.4* 13.3*  HGB 12.5 12.9 13.1  HCT 39.2 40.8 41.8  PLT 245 242 213    Recent Labs Lab 07/24/17 1313 07/25/17 0735 07/26/17 0553  NA 137 138 135  K 4.9 5.1 5.1  CL 101 105 101  CO2 26 25 28   BUN 40* 35* 37*  CREATININE 1.50* 1.43* 1.59*  CALCIUM 9.7 9.0 8.8*  GLUCOSE 274* 138* 214*    Imaging/Diagnostic Tests: Dg Chest 2 View  Result Date: 07/24/2017 CLINICAL DATA:  Patient status post fall. EXAM: CHEST  2 VIEW COMPARISON:  None. FINDINGS: Monitoring leads overlie the patient. Cardiomegaly. No consolidative pulmonary opacities. No pleural effusion or pneumothorax. Thoracic spine degenerative changes. IMPRESSION: Cardiomegaly.  No acute cardiopulmonary process. Electronically Signed   By: Annia Belt M.D.   On: 07/24/2017 16:58   Dg Chest Port 1 View  Result Date: 07/24/2017 CLINICAL DATA:  Shortness of breath.  Postoperative patient. EXAM: PORTABLE CHEST 1 VIEW COMPARISON:  07/24/2017 FINDINGS: Shallow inspiration with atelectasis in the lung bases. Cardiac enlargement. Pulmonary vascularity is normal for technique. No consolidation or edema.  No blunting of costophrenic angles. No pneumothorax. Calcification of the aorta. Old healed fracture deformity of the left clavicle. IMPRESSION: Shallow inspiration with atelectasis in the lung bases. Cardiac enlargement. No consolidation. Electronically Signed   By: Burman NievesWilliam  Stevens M.D.   On: 07/24/2017 21:27   Dg Hand Complete Left  Result Date: 07/24/2017 CLINICAL DATA:  Patient status post fall. Left hand wound around the posterior aspect of the carpal bones. Initial encounter. EXAM: LEFT HAND - COMPLETE 3+ VIEW COMPARISON:  None. FINDINGS: Soft tissue defect overlying the dorsal aspect of the hand. Vascular calcifications. Normal anatomic  alignment. No evidence for acute fracture or dislocation. IMPRESSION: No acute osseous abnormality. Soft tissue injury overlying the dorsal aspect of the hand. Electronically Signed   By: Annia Beltrew  Davis M.D.   On: 07/24/2017 16:58     Garnette Gunnerhompson, Wendal Wilkie B, MD 07/27/2017, 1:39 PM PGY-1, [facility] Intern pager: [phone], text pages welcome

## 2017-07-27 NOTE — Progress Notes (Signed)
Left unit via bed, 02 2lNC intact, to OR for surgery. No signs of distress; accompanied by OR Tax inspectortransportation technician.

## 2017-07-27 NOTE — Anesthesia Postprocedure Evaluation (Signed)
Anesthesia Post Note  Patient: Venetia NightDebbie Shrode  Procedure(s) Performed: Procedure(s) (LRB): IRRIGATION AND DEBRIDEMENT OF LEFT HAND (Left) APPLICATION OF A-CELL (Left)     Patient location during evaluation: PACU Anesthesia Type: General Level of consciousness: awake and alert Pain management: pain level controlled Vital Signs Assessment: post-procedure vital signs reviewed and stable Respiratory status: spontaneous breathing, nonlabored ventilation, respiratory function stable and patient connected to nasal cannula oxygen Cardiovascular status: blood pressure returned to baseline and stable Postop Assessment: no signs of nausea or vomiting Anesthetic complications: no    Last Vitals:  Vitals:   07/27/17 0950 07/27/17 1007  BP: (!) 121/92 (!) 143/96  Pulse: 97 88  Resp: (!) 21 (!) 23  Temp: (!) 36.1 C 36.4 C    Last Pain:  Vitals:   07/27/17 1007  TempSrc:   PainSc: 0-No pain                 Phillips Groutarignan, [place]ondale Nies

## 2017-07-27 NOTE — Anesthesia Procedure Notes (Signed)
Procedure Name: Intubation Date/Time: 07/27/2017 8:36 AM Performed by: Trixie Deis A Pre-anesthesia Checklist: Patient identified, Emergency Drugs available, Suction available and Patient being monitored Patient Re-evaluated:Patient Re-evaluated prior to induction Oxygen Delivery Method: Circle System Utilized Preoxygenation: Pre-oxygenation with 100% oxygen Induction Type: IV induction Ventilation: Mask ventilation without difficulty Laryngoscope Size: Mac and 3 Grade View: Grade I Tube type: Oral Tube size: 7.0 mm Number of attempts: 1 Airway Equipment and Method: Stylet and Oral airway Placement Confirmation: ETT inserted through vocal cords under direct vision,  positive ETCO2 and breath sounds checked- equal and bilateral Secured at: 21 cm Tube secured with: Tape Dental Injury: Teeth and Oropharynx as per pre-operative assessment

## 2017-07-27 NOTE — Progress Notes (Signed)
Returned to room 4E09 from surgery via bed. Accompanied by OR, RN and Tax inspectortransportation technician. 02 2LNC intact.saturation 96% Answers questions appropriately. Left arm is ace wrapped (s/p I&D)  elevated on pillow. Vitals BP 143/96. Heart rate 94 bpm, RR 22 bpm,.

## 2017-07-27 NOTE — Progress Notes (Deleted)
Off unit for surgery. Left unit via bed, 2 LNC intact. Accompanied by OR transportation. No signs of distress.

## 2017-07-27 NOTE — Discharge Instructions (Signed)
Apply KY gel to the left hand daily.  Do not get wet.  Information on my medicine - ELIQUIS (apixaban)  Why was Eliquis prescribed for you? Eliquis was prescribed for you to reduce the risk of a blood clot forming that can cause a stroke if you have a medical condition called atrial fibrillation (a type of irregular heartbeat).  What do You need to know about Eliquis ? Take your Eliquis TWICE DAILY - one tablet in the morning and one tablet in the evening with or without food. If you have difficulty swallowing the tablet whole please discuss with your pharmacist how to take the medication safely.  Take Eliquis exactly as prescribed by your doctor and DO NOT stop taking Eliquis without talking to the doctor who prescribed the medication.  Stopping may increase your risk of developing a stroke.  Refill your prescription before you run out.  After discharge, you should have regular check-up appointments with your healthcare provider that is prescribing your Eliquis.  In the future your dose may need to be changed if your kidney function or weight changes by a significant amount or as you get older.  What do you do if you miss a dose? If you miss a dose, take it as soon as you remember on the same day and resume taking twice daily.  Do not take more than one dose of ELIQUIS at the same time to make up a missed dose.  Important Safety Information A possible side effect of Eliquis is bleeding. You should call your healthcare provider right away if you experience any of the following: ? Bleeding from an injury or your nose that does not stop. ? Unusual colored urine (red or dark brown) or unusual colored stools (red or black). ? Unusual bruising for unknown reasons. ? A serious fall or if you hit your head (even if there is no bleeding).  Some medicines may interact with Eliquis and might increase your risk of bleeding or clotting while on Eliquis. To help avoid this, consult your  healthcare provider or pharmacist prior to using any new prescription or non-prescription medications, including herbals, vitamins, non-steroidal anti-inflammatory drugs (NSAIDs) and supplements.  This website has more information on Eliquis (apixaban): http://www.eliquis.com/eliquis/home

## 2017-07-27 NOTE — Anesthesia Preprocedure Evaluation (Signed)
Anesthesia Evaluation  Patient identified by MRN, date of birth, ID band Patient awake    Reviewed: Allergy & Precautions, H&P , NPO status , Patient's Chart, lab work & pertinent test results, reviewed documented beta blocker date and time   Airway Mallampati: II  TM Distance: >3 FB Neck ROM: Full    Dental no notable dental hx. (+) Upper Dentures, Lower Dentures, Dental Advisory Given   Pulmonary neg pulmonary ROS, former smoker,    Pulmonary exam normal breath sounds clear to auscultation       Cardiovascular hypertension, Pt. on medications and Pt. on home beta blockers + dysrhythmias Atrial Fibrillation  Rhythm:Irregular Rate:Normal     Neuro/Psych negative neurological ROS  negative psych ROS   GI/Hepatic negative GI ROS, Neg liver ROS,   Endo/Other  diabetes, Insulin Dependent, Oral Hypoglycemic AgentsHypothyroidism Morbid obesity  Renal/GU Renal InsufficiencyRenal diseasenegative Renal ROS  negative genitourinary   Musculoskeletal   Abdominal   Peds  Hematology negative hematology ROS (+) anemia ,   Anesthesia Other Findings   Reproductive/Obstetrics negative OB ROS                             Anesthesia Physical  Anesthesia Plan  ASA: III  Anesthesia Plan: General   Post-op Pain Management:    Induction: Intravenous  PONV Risk Score and Plan: 4 or greater and Ondansetron, Dexamethasone and Midazolam  Airway Management Planned: LMA  Additional Equipment:   Intra-op Plan:   Post-operative Plan: Extubation in OR  Informed Consent: I have reviewed the patients History and Physical, chart, labs and discussed the procedure including the risks, benefits and alternatives for the proposed anesthesia with the patient or authorized representative who has indicated his/her understanding and acceptance.   Dental advisory given  Plan Discussed with: CRNA  Anesthesia Plan  Comments:         Anesthesia Quick Evaluation

## 2017-07-28 ENCOUNTER — Encounter (HOSPITAL_COMMUNITY): Payer: Self-pay | Admitting: Plastic Surgery

## 2017-07-28 LAB — CBC
HEMATOCRIT: 41 % (ref 36.0–46.0)
Hemoglobin: 12.9 g/dL (ref 12.0–15.0)
MCH: 30.6 pg (ref 26.0–34.0)
MCHC: 31.5 g/dL (ref 30.0–36.0)
MCV: 97.4 fL (ref 78.0–100.0)
PLATELETS: 242 10*3/uL (ref 150–400)
RBC: 4.21 MIL/uL (ref 3.87–5.11)
RDW: 13.1 % (ref 11.5–15.5)
WBC: 11.8 10*3/uL — ABNORMAL HIGH (ref 4.0–10.5)

## 2017-07-28 LAB — GLUCOSE, CAPILLARY
GLUCOSE-CAPILLARY: 216 mg/dL — AB (ref 65–99)
Glucose-Capillary: 273 mg/dL — ABNORMAL HIGH (ref 65–99)
Glucose-Capillary: 195 mg/dL — ABNORMAL HIGH (ref 65–99)
Glucose-Capillary: 206 mg/dL — ABNORMAL HIGH (ref 65–99)

## 2017-07-28 LAB — BASIC METABOLIC PANEL
Anion gap: 8 (ref 5–15)
BUN: 42 mg/dL — ABNORMAL HIGH (ref 6–20)
CALCIUM: 9 mg/dL (ref 8.9–10.3)
CO2: 26 mmol/L (ref 22–32)
CREATININE: 1.33 mg/dL — AB (ref 0.44–1.00)
Chloride: 103 mmol/L (ref 101–111)
GFR, EST AFRICAN AMERICAN: 47 mL/min — AB (ref >60)
GFR, EST NON AFRICAN AMERICAN: 40 mL/min — AB (ref >60)
GLUCOSE: 303 mg/dL — AB (ref 65–99)
Potassium: 5.5 mmol/L — ABNORMAL HIGH (ref 3.5–5.1)
Sodium: 137 mmol/L (ref 135–145)

## 2017-07-28 MED ORDER — APIXABAN 5 MG PO TABS
5.0000 mg | ORAL_TABLET | Freq: Two times a day (BID) | ORAL | Status: DC
  Administered 2017-07-28 – 2017-07-30 (×4): 5 mg via ORAL
  Filled 2017-07-28 (×4): qty 1

## 2017-07-28 NOTE — Progress Notes (Signed)
Occupational Therapy Treatment Patient Details Name: Sylvia Craig MRN: [id number] DOB: 09-10-1951 Today's Date: 07/28/2017    History of present illness This 66 y.o. female admitted with Lt hand infection secondary to soft tissue injury that occurred during a fall ~10 days PTA.  She underwent debridement of Lt hand by ortho on 7/29, and second surgery on 07/27/17.  PMH includes:   CHF, DM, HTN   OT comments  Pt requires mod A for ADLs due to inability to use Lt UE.  She is able to use AE with some success.  She reports son will assist her at discharge as needed.  Will continue to follow   Follow Up Recommendations  Outpatient OT;Supervision/Assistance - 24 hour    Equipment Recommendations  3 in 1 bedside commode;Other (comment)    Recommendations for Other Services      Precautions / Restrictions Precautions Precautions: Fall Precaution Comments: Pt denies other falls except for the one that led to her hand injury  Restrictions Weight Bearing Restrictions: No       Mobility Bed Mobility Overal bed mobility: Needs Assistance Bed Mobility: Supine to Sit;Sit to Supine Rolling: Supervision Sidelying to sit: Supervision   Sit to supine: Supervision   General bed mobility comments: Verbal cues for technique.  Patient able to move to sitting with assist for safety only.  Required assist to bring BLE's onto bed to return to supine.  Transfers Overall transfer level: Needs assistance Equipment used: 1 person hand held assist Transfers: Sit to/from Stand Sit to Stand: Min assist         General transfer comment: assist for balance     Balance Overall balance assessment: Needs assistance Sitting-balance support: Feet supported Sitting balance-Leahy Scale: Good     Standing balance support: Single extremity supported Standing balance-Leahy Scale: Poor                             ADL either performed or assessed with clinical judgement   ADL Overall ADL's :  Needs assistance/impaired Eating/Feeding: Modified independent;Sitting   Grooming: Wash/dry hands;Wash/dry face;Oral care;Brushing hair;Minimal assistance;Standing Grooming Details (indicate cue type and reason): assist for set up and balance  Upper Body Bathing: Set up;Supervision/ safety;Sitting;Standing;Min guard   Lower Body Bathing: Minimal assistance;Sit to/from stand Lower Body Bathing Details (indicate cue type and reason): unable to access feet - familiar with use of AE  Upper Body Dressing : Minimal assistance;Sitting   Lower Body Dressing: Sit to/from stand;Moderate assistance Lower Body Dressing Details (indicate cue type and reason): Pt able to use reacher to pull pants over feet, but unable to pull them over hips.  She required mod A to don socks with sock aid  Toilet Transfer: Minimal assistance;Stand-pivot;BSC   Toileting- Clothing Manipulation and Hygiene: Sit to/from stand;Moderate assistance       Functional mobility during ADLs: Minimal assistance       Vision       Perception     Praxis      Cognition Arousal/Alertness: Awake/alert Behavior During Therapy: WFL for tasks assessed/performed Overall Cognitive Status: Within Functional Limits for tasks assessed                                          Exercises     Shoulder Instructions       General Comments 02 sats dropped  84-93% on 3L supplemental 02 during activity     Pertinent Vitals/ Pain       Pain Assessment: Faces Faces Pain Scale: Hurts a little bit Pain Location: Lt hand and Lt side Pain Descriptors / Indicators: Sore Pain Intervention(s): Monitored during session  Home Living Family/patient expects to be discharged to:: Private residence Living Arrangements: Children Available Help at Discharge: Family;Available 24 hours/day Type of Home: House Home Access: Stairs to enter [employer] CorporationEntrance Stairs-Number of Steps: 2+1 Entrance Stairs-Rails: Left Home Layout: One level      Bathroom Shower/Tub: Chief Strategy OfficerTub/shower unit   Bathroom Toilet: Standard     Home Equipment: Cane - single point;Tub bench          Prior Functioning/Environment Level of Independence: Independent        Comments: Pt reports she was independent with ADLs.  Son does most of grocery shopping.  Ambulated short distances.  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 [employer]    Frequency  Min 3X/week        Progress Toward Goals  OT Goals(current goals can now be found in the care plan section)  Progress towards OT goals: Progressing toward goals  Acute Rehab OT Goals Patient Stated Goal: to get better   Plan Discharge plan remains appropriate    Co-evaluation                 AM-PAC PT "6 Clicks" Daily Activity     Outcome Measure   Help from another person eating meals?: A Little Help from another person taking care of personal grooming?: A Little Help from another person toileting, which includes using toliet, bedpan, or urinal?: A Lot Help from another person bathing (including washing, rinsing, drying)?: A Little Help from another person to put on and taking off regular upper body clothing?: A Little Help from another person to put on and taking off regular lower body clothing?: A Lot 6 Click Score: 16    End of Session Equipment Utilized During Treatment: Oxygen  OT Visit Diagnosis: Unsteadiness on feet (R26.81);Muscle weakness (generalized) (M62.81)   Activity Tolerance Patient tolerated treatment well   Patient Left in bed;with call bell/phone within reach   Nurse Communication Mobility status        Time: 5638-75641531-1612 OT Time Calculation (min): 41 min  Charges: OT General Charges $OT Visit: 1 Procedure OT Treatments $Self Care/Home Management : 38-52 mins  Reynolds AmericanWendi Caliope Craig, OTR/L [phone]    Jeani HawkingConarpe, Sylvia Craig 07/28/2017, 4:40 PM

## 2017-07-28 NOTE — Progress Notes (Signed)
Inpatient Diabetes Program Recommendations  AACE/ADA: New Consensus Statement on Inpatient Glycemic Control (2015)  Target Ranges:  Prepandial:   less than 140 mg/dL      Peak postprandial:   less than 180 mg/dL (1-2 hours)      Critically ill patients:  140 - 180 mg/dL   Lab Results  Component Value Date   GLUCAP 273 (H) 07/28/2017   HGBA1C 9.7 (H) 07/25/2017    Review of Glycemic Control Results for Sylvia Craig, [name] (MRN [id number]) as of 07/28/2017 11:28  Ref. Range 07/27/2017 11:07 07/27/2017 15:23 07/27/2017 17:22 07/27/2017 21:12 07/28/2017 06:06  Glucose-Capillary Latest Ref Range: 65 - 99 mg/dL 409116 (H) 811135 (H) 914192 (H) 327 (H) 273 (H)   Diabetes history: DM2 Outpatient Diabetes medications: Lantus 26 + Humalog 6 units tid + Amaryl 4 mg BID Current orders for Inpatient glycemic control: Lantus 18 units + Novolog correction 0-15 units tid  Inpatient Diabetes Program Recommendations:  Noted postprandial hyperglycemia. Please consider: -Novolog 4 units meal coverage if eats 50%  Thank you, Darel HongJudy E. Rainn Bullinger, RN, MSN, CDE  Diabetes Coordinator Inpatient Glycemic Control Team Team Pager [phone] (8am-5pm) 07/28/2017 12:13 PM

## 2017-07-28 NOTE — Progress Notes (Addendum)
Family Medicine Teaching Service Daily Progress Note Intern Pager: [phone]  Patient name: Sylvia Craig Medical record number: [id number] Date of birth: 04-Feb-1951 Age: 66 y.o. Gender: female  Primary Care Provider: System, Pcp Not In Consultants: hand surgery  Code Status: Full   Pt Overview and Major Events to Date:  Sylvia HoveDebbie Dunnis a 66 y.o.female who presented on 7/29with left hand infection 2/2 mechanical fall 10 days prior to admission. [facility] S/p Left hand debridement x2 on 7/29 and 8/01.  Assessment and Plan: Sylvia HoveDebbie Dunnis a 66 y.o.female who presented on 7/29with left hand infection 2/2 mechanical fall 10 days prior to admission. PMH is significant for T2DM, HTN, HLD, morbid obesity, vitamin D deficiency, CKD stage 3, iron deficiency anemia, [name] s/p triple vessel cath in 2015 with no stents, CHF NYHA class 2, a fib, and hypothyroidism.  Left hand infection  2/2 mechanical fall. Hand surgery was consulted and performed debridement on 7/29. Currently afebrile. Mild leukocytosis persistent at 13.3. Xray of hand showed no acute osseous abnormality, and soft tissue injury overlying the dorsal aspect of the hand. CXR showing cardiomegaly, and no acute cardiopulmonary process with no fractures (patient reports hitting left chest in fall).Patient given IV vancomycin and IV Zosyn in ED with Code Sepsis order set and started on Ancef by FPTS on 7/29. Preliminary blood cultures showing few gram positive cocci, few gram positive rods, and rare gram negative rods. Show no growth at day 5. Lactic acid 1.33. Wound care consulted prior to debridement and have referred care to hand surgery. Plastic surgery recommendations of Vit C 500 mg daily, Zinc 220 mg daily, increasing protein in diet as tolerated by kidneys, diabetic counseling, and nutrition counseling, elevation of hand, and continued wet to dry dressing changes tid. Plastic surgery performed additional debridement on  8/01. Patient today states improvement. Endorses some pain in hand. States no fever or chills. No swelling or erythema on exam.  -appreciate hand and plastic surgery recommendations  -appreciate wound care recommendations -areobic culture final no growth -anerobic still pending, no growth day  At 5 days -blood cultures pending, no growth at 3 days -continue cefazolin 2 g IV, consider transition to oral abx -Restarted Eliquis per Aquilla HackerHenry Martensen, PA (hand surgery) at 2.5 mg bid  -continuous pulse ox  -tylenol 1000 mg q6hrs prn pain   Respiratory distress Likely 2/2 fluid overload due to NS boluses from code sepsis. Patient given 40 mg IV lasix by PACU. Repeat CXR showing no frank edema, and shallow inspiration with atelectasis. Atrovent neg given in PACU. Patient currently satting at 97% on 2L via nasal canula with RR of 17. Current weight 279 lb (dry weight 266 lbs). Patient denies SOB, but endorses cough without sputum production. -strict I's and O's via foley  -continue po lasix 40 mg  -wean off O2 -aggressive pulmonary toilet  Skin irritation Skin irritation of bilaterally inner thighs. Patient states it is not bothering her.  -continue desitin PRN  T2DM Current CBG of 86, improved from mid 150s o/n. Current A1C of 9.7. Home humalog 6 units TID with meals, Amaryl 4mg  BID, lantus 26 units daily.  -monitor CBGs ACHS -lantus to 18 units at bedtime -moderate SSI  HTN BP of 113/75. Home meds are toprol XL 100mg , cozaar 25 mg. Will dose metoprolol 50mg  BID to avoid rebound tachycardia as BP can tolerate.  -metoprolol immediate release 50 mg bid -continue to monitor   HFpEF Echo 2015 showing EF 45%, Cath 2015 showing  EF 35%. Weight on 06/28/2016 266 lb 6.4 oz. Weight on admission of 267 lbs. Current weight 279 lbs. Followed by Dr. Tami Ribas [facility], [place]). Home lasix 40 mg BID and losartan 25 mg. Patient initially ordered for 30mg /kg NS boluses, but upon discovery of reduced EF, patient  had received 2L NS boluses and additional boluses were discontinued. Patient was euvolemic on exam.  -continue lasix 40 mg po to start 7/30 -monitor fluid status -I's and O's -consider restarting losartan at discharge   Atrial fibrillation  On home Eliquis 2.5 mg and metoprolol. Unable to appreciate why Eliquis is dosed at 2.5mg  BID (per pharmacy patient would have to have 2 of 3: age >82, Cr >1.5, and <100kg). Patient has one of these criteria (Cr >1.5 in recent past). Patient self discontinued Eliquis since the fall on 7/19.  -Re start Elliquis per Aquilla Hacker, PA (hand surgery)  HLD Home crestor 10 mg -continue home crestor  CKD stage 3 Current Cr 1.59. Per chart review baseline ~1.4.  -continue to monitor   Iron deficiency anemia  Current CBC wnl showing Hgb 12.9, Hct 40.8, MCV 97.6. Home iron supplementation.  -no supplementation during acute illness  [name]  Home nitro prn, metoprolol, and crestor. Patient denies current CP.  -continue nitro prn -metoprolol immediate release 50 mg bid  Hypothyroidism  Home synthroid 137 mcg -continue home synthroid   FEN/GI: carb modified  PPx: Eliquis 2.5 mg bid  Disposition: dc home with outpatient OT, PT  Subjective:  Patient today states she is improved. Patient denies pain in hand. States slight headache last night that was alleviated with prn Tylenol. Patient denies fever/chills. Patient denies SOB. Patient has cough with no sputum production. States OT is improving hand functionality and pain.   Objective: Temp:  [97 F (36.1 C)-98 F (36.7 C)] 98 F (36.7 C) (08/02 0401) Pulse Rate:  [77-111] 84 (08/02 0401) Resp:  [16-24] 21 (08/02 0401) BP: (87-143)/(58-96) 139/77 (08/02 0401) SpO2:  [89 %-100 %] 99 % (08/02 0401) Weight:  [293 lb 12.8 oz (133.3 kg)] 293 lb 12.8 oz (133.3 kg) (08/02 0400) Physical Exam: General: awake and alert, NAD, lying in bed  Cardiovascular: RRR, no MRG Respiratory: CTAB, no wheezes,  rales, or rhonchi  Abdomen: soft, non tender, non distended, bowel sounds x 4 quadrants  Extremities: soft, no edema, 2 sec capillary refill bilaterally, left arm wrapped in dressing.   Laboratory:  Recent Labs Lab 07/26/17 0312 07/27/17 1018 07/28/17 0230  WBC 13.4* 13.3* 11.8*  HGB 12.9 13.1 12.9  HCT 40.8 41.8 41.0  PLT 242 213 242    Recent Labs Lab 07/25/17 0735 07/26/17 0553 07/28/17 0230  NA 138 135 137  K 5.1 5.1 5.5*  CL 105 101 103  CO2 25 28 26   BUN 35* 37* 42*  CREATININE 1.43* 1.59* 1.33*  CALCIUM 9.0 8.8* 9.0  GLUCOSE 138* 214* 303*    Imaging/Diagnostic Tests: Dg Chest 2 View  Result Date: 07/24/2017 CLINICAL DATA:  Patient status post fall. EXAM: CHEST  2 VIEW COMPARISON:  None. FINDINGS: Monitoring leads overlie the patient. Cardiomegaly. No consolidative pulmonary opacities. No pleural effusion or pneumothorax. Thoracic spine degenerative changes. IMPRESSION: Cardiomegaly.  No acute cardiopulmonary process. Electronically Signed   By: Annia Belt M.D.   On: 07/24/2017 16:58   Dg Chest Port 1 View  Result Date: 07/24/2017 CLINICAL DATA:  Shortness of breath.  Postoperative patient. EXAM: PORTABLE CHEST 1 VIEW COMPARISON:  07/24/2017 FINDINGS: Shallow inspiration with atelectasis in the  lung bases. Cardiac enlargement. Pulmonary vascularity is normal for technique. No consolidation or edema. No blunting of costophrenic angles. No pneumothorax. Calcification of the aorta. Old healed fracture deformity of the left clavicle. IMPRESSION: Shallow inspiration with atelectasis in the lung bases. Cardiac enlargement. No consolidation. Electronically Signed   By: Burman NievesWilliam  Stevens M.D.   On: 07/24/2017 21:27   Dg Hand Complete Left  Result Date: 07/24/2017 CLINICAL DATA:  Patient status post fall. Left hand wound around the posterior aspect of the carpal bones. Initial encounter. EXAM: LEFT HAND - COMPLETE 3+ VIEW COMPARISON:  None. FINDINGS: Soft tissue defect  overlying the dorsal aspect of the hand. Vascular calcifications. Normal anatomic alignment. No evidence for acute fracture or dislocation. IMPRESSION: No acute osseous abnormality. Soft tissue injury overlying the dorsal aspect of the hand. Electronically Signed   By: Annia Beltrew  Davis M.D.   On: 07/24/2017 16:58     Garnette Gunnerhompson, Aaron B, MD 07/28/2017, 8:48 AM PGY-1, [place] [facility] Intern pager: [phone], text pages welcome  I have interviewed and examined the patient.  I have discussed the case and verified the key findings with Dr. Janee Mornhompson.   I agree with their assessments and plans as documented in their visit note.

## 2017-07-28 NOTE — Care Management Important Message (Signed)
Important Message  Patient Details  Name: Sylvia Craig MRN: [id number] Date of Birth: February 05, 1951   Medicare Important Message Given:  Yes    Tawanna Funk Abena 07/28/2017, 10:28 AM

## 2017-07-28 NOTE — Evaluation (Signed)
Physical Therapy Evaluation Patient Details Name: Sylvia NightDebbie Bender MRN: [id number] DOB: Aug 22, 1951 Today's Date: 07/28/2017   History of Present Illness  This 66 y.o. female admitted with Lt hand infection secondary to soft tissue injury that occurred during a fall ~10 days PTA.  She underwent debridement of Lt hand by ortho on 7/29, and second surgery on 07/27/17.  PMH includes:   CHF, DM, HTN  Clinical Impression  Patient presents with problems listed below.  Will benefit from acute PT to maximize functional mobility prior to discharge.  Patient with decreased strength and balance during gait.  Recommend use of Lt Platform RW for home, and f/u OP PT for continued therapy for mobility/balance at d/c.  Will continue to follow.    Follow Up Recommendations Outpatient PT;Supervision for mobility/OOB    Equipment Recommendations  Rolling walker with 5" wheels (Lt Platform RW)    Recommendations for Other Services       Precautions / Restrictions Precautions Precautions: Fall Precaution Comments: Pt denies other falls except for the one that led to her hand injury  Restrictions Weight Bearing Restrictions: No      Mobility  Bed Mobility Overal bed mobility: Needs Assistance Bed Mobility: Rolling;Sidelying to Sit;Sit to Supine Rolling: Supervision Sidelying to sit: Min guard   Sit to supine: Mod assist   General bed mobility comments: Verbal cues for technique.  Patient able to move to sitting with assist for safety only.  Required assist to bring BLE's onto bed to return to supine.  Transfers Overall transfer level: Needs assistance Equipment used: 1 person hand held assist Transfers: Sit to/from Stand Sit to Stand: Min assist         General transfer comment: Verbal cues for hand placement.  Patient stood x 3 from bed, first time to tall chair for UE support, and last 2 times with hand hold assist.  Min assist to rise to standing and for  balance.  Ambulation/Gait Ambulation/Gait assistance: Min assist Ambulation Distance (Feet): 24 Feet Assistive device: 1 person hand held assist Gait Pattern/deviations: Step-through pattern;Decreased step length - right;Decreased step length - left;Decreased stride length;Shuffle;Staggering left;Staggering right     General Gait Details: Patient able to take 20 steps in place with each foot while standing at chair with min guard assist.  Patient then able to walk 3' forward and 3' backward with hand hold assist x 4.  Patient very unsteady during gait, requiring assist to prevent fall.  Stairs            Wheelchair Mobility    Modified Rankin (Stroke Patients Only)       Balance           Standing balance support: Single extremity supported Standing balance-Leahy Scale: Poor                               Pertinent Vitals/Pain Pain Assessment: Faces Faces Pain Scale: Hurts little more Pain Location: Lt hand and Lt side Pain Descriptors / Indicators: Sore Pain Intervention(s): Monitored during session;Repositioned    Home Living Family/patient expects to be discharged to:: Private residence Living Arrangements: Children Available Help at Discharge: Family;Available 24 hours/day Type of Home: House Home Access: Stairs to enter Entrance Stairs-Rails: Left Entrance Stairs-Number of Steps: 2+1 Home Layout: One level Home Equipment: Cane - single point;Tub bench      Prior Function Level of Independence: Independent         Comments: Pt  reports she was independent with ADLs.  Son does most of grocery shopping.  Ambulated short distances.  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 [employer]      Hand Dominance   Dominant Hand: Right    Extremity/Trunk Assessment   Upper Extremity Assessment Upper Extremity Assessment: Defer to OT evaluation    Lower Extremity Assessment Lower Extremity Assessment: Generalized weakness        Communication   Communication: No difficulties  Cognition Arousal/Alertness: Awake/alert Behavior During Therapy: WFL for tasks assessed/performed Overall Cognitive Status: Within Functional Limits for tasks assessed                                        General Comments      Exercises     Assessment/Plan    PT Assessment Patient needs continued PT services  PT Problem List Decreased strength;Decreased activity tolerance;Decreased balance;Decreased mobility;Decreased knowledge of use of DME;Cardiopulmonary status limiting activity;Obesity;Pain       PT Treatment Interventions DME instruction;Gait training;Stair training;Functional mobility training;Therapeutic activities;Therapeutic exercise;Balance training;Patient/family education    PT Goals (Current goals can be found in the Care Plan section)  Acute Rehab PT Goals Patient Stated Goal: to get better  PT Goal Formulation: With patient Time For Goal Achievement: 08/04/17 Potential to Achieve Goals: Good    Frequency Min 3X/week   Barriers to discharge        Co-evaluation               AM-PAC PT "6 Clicks" Daily Activity  Outcome Measure Difficulty turning over in bed (including adjusting bedclothes, sheets and blankets)?: None Difficulty moving from lying on back to sitting on the side of the bed? : A Little Difficulty sitting down on and standing up from a chair with arms (e.g., wheelchair, bedside commode, etc,.)?: Total Help needed moving to and from a bed to chair (including a wheelchair)?: A Little Help needed walking in hospital room?: A Little Help needed climbing 3-5 steps with a railing? : A Lot 6 Click Score: 16    End of Session Equipment Utilized During Treatment: Gait belt;Oxygen Activity Tolerance: Patient limited by fatigue Patient left: in bed;with call bell/phone within reach   PT Visit Diagnosis: Unsteadiness on feet (R26.81);Other abnormalities of gait and mobility  (R26.89);Muscle weakness (generalized) (M62.81);Pain Pain - Right/Left: Left Pain - part of body: Hand (side)    Time: 1610-96041452-1518 PT Time Calculation (min) (ACUTE ONLY): 26 min   Charges:   PT Evaluation $PT Eval Moderate Complexity: 1 Mod PT Treatments $Therapeutic Activity: 8-22 mins   PT G Codes:        Durenda HurtSusan H. Renaldo Fiddleravis, PT, [facility] Acute Rehab Services Pager [phone]   Vena AustriaSusan H Ayahna Solazzo 07/28/2017, 4:01 PM

## 2017-07-29 DIAGNOSIS — G4733 Obstructive sleep apnea (adult) (pediatric): Secondary | ICD-10-CM

## 2017-07-29 DIAGNOSIS — R0902 Hypoxemia: Secondary | ICD-10-CM

## 2017-07-29 LAB — BASIC METABOLIC PANEL
Anion gap: 6 (ref 5–15)
BUN: 56 mg/dL — AB (ref 6–20)
CHLORIDE: 102 mmol/L (ref 101–111)
CO2: 31 mmol/L (ref 22–32)
CREATININE: 1.31 mg/dL — AB (ref 0.44–1.00)
Calcium: 9.3 mg/dL (ref 8.9–10.3)
GFR calc Af Amer: 48 mL/min — ABNORMAL LOW (ref >60)
GFR calc non Af Amer: 41 mL/min — ABNORMAL LOW (ref >60)
GLUCOSE: 177 mg/dL — AB (ref 65–99)
Potassium: 4.7 mmol/L (ref 3.5–5.1)
SODIUM: 139 mmol/L (ref 135–145)

## 2017-07-29 LAB — CULTURE, BLOOD (ROUTINE X 2)
CULTURE: NO GROWTH
CULTURE: NO GROWTH
SPECIAL REQUESTS: ADEQUATE

## 2017-07-29 LAB — CBC
HCT: 40.8 % (ref 36.0–46.0)
HEMOGLOBIN: 12.7 g/dL (ref 12.0–15.0)
MCH: 30.6 pg (ref 26.0–34.0)
MCHC: 31.1 g/dL (ref 30.0–36.0)
MCV: 98.3 fL (ref 78.0–100.0)
Platelets: 257 10*3/uL (ref 150–400)
RBC: 4.15 MIL/uL (ref 3.87–5.11)
RDW: 13.3 % (ref 11.5–15.5)
WBC: 13.9 10*3/uL — ABNORMAL HIGH (ref 4.0–10.5)

## 2017-07-29 LAB — GLUCOSE, CAPILLARY
GLUCOSE-CAPILLARY: 149 mg/dL — AB (ref 65–99)
GLUCOSE-CAPILLARY: 100 mg/dL — AB (ref 65–99)
GLUCOSE-CAPILLARY: 214 mg/dL — AB (ref 65–99)
Glucose-Capillary: 187 mg/dL — ABNORMAL HIGH (ref 65–99)

## 2017-07-29 LAB — ANAEROBIC CULTURE

## 2017-07-29 MED ORDER — CEPHALEXIN 500 MG PO CAPS
500.0000 mg | ORAL_CAPSULE | Freq: Two times a day (BID) | ORAL | Status: DC
  Administered 2017-07-29 – 2017-07-30 (×3): 500 mg via ORAL
  Filled 2017-07-29 (×3): qty 1

## 2017-07-29 NOTE — Progress Notes (Signed)
Physical Therapy Treatment Patient Details Name: Venetia NightDebbie Kirkes MRN: [id number] DOB: 1951/12/22 Today's Date: 07/29/2017    History of Present Illness This 66 y.o. female admitted with Lt hand infection secondary to soft tissue injury that occurred during a fall ~10 days PTA.  She underwent debridement of Lt hand by ortho on 7/29, and second surgery on 07/27/17.  PMH includes:   CHF, DM, HTN    PT Comments    Patient is progressing toward mobility goals and tolerated gait with L platform RW and stair training this session. Pt with O2 desat with mobility on 2L O2 via Belt. Did not attempt mobility without supplemental O2. Current plan remains appropriate.    Follow Up Recommendations  Outpatient PT;Supervision for mobility/OOB     Equipment Recommendations   (L platform RW)    Recommendations for Other Services       Precautions / Restrictions Precautions Precautions: Fall Precaution Comments: Pt denies other falls except for the one that led to her hand injury     Mobility  Bed Mobility Overal bed mobility: Needs Assistance Bed Mobility: Supine to Sit   Sidelying to sit: Supervision;HOB elevated       General bed mobility comments: pt OOB in chair upon arrival  Transfers Overall transfer level: Needs assistance Equipment used: Left platform walker Transfers: Sit to/from Stand Sit to Stand: Min guard Stand pivot transfers: Min assist       General transfer comment: min guard for safety; cues for hand placement and safe use of platform RW  Ambulation/Gait Ambulation/Gait assistance: Min guard;Supervision Ambulation Distance (Feet): 190 Feet Assistive device: Left platform walker Gait Pattern/deviations: Step-through pattern;Decreased stride length;Drifts right/left Gait velocity: decreased   General Gait Details: pt with grossly steady gait and no LOB with use of AD; cues for proximity of RW and positioning of L UE on platform which was adjusted on pt's equipment she  will take home   Stairs Stairs: Yes   Stair Management: One rail Left;Step to pattern;Sideways Number of Stairs: 2 General stair comments: cues for sequencing and technique; practiced with L side rail to simulate home with pt going sideways to use R UE on L rail  Wheelchair Mobility    Modified Rankin (Stroke Patients Only)       Balance Overall balance assessment: Needs assistance Sitting-balance support: Feet supported Sitting balance-Leahy Scale: Good     Standing balance support: During functional activity;No upper extremity supported Standing balance-Leahy Scale: Fair Standing balance comment: static standing                            Cognition Arousal/Alertness: Awake/alert Behavior During Therapy: WFL for tasks assessed/performed Overall Cognitive Status: Within Functional Limits for tasks assessed                                        Exercises Other Exercises Other Exercises: Pt is independent with shoulder and elbow ROM and is independently elevating Lt UE     General Comments General comments (skin integrity, edema, etc.): pt with O2 desat on 2L O2 via [place] with stair negotiation; pt educated on breathing technique      Pertinent Vitals/Pain Pain Assessment: Faces Faces Pain Scale: Hurts little more Pain Location: Lt side chest with movement  Pain Descriptors / Indicators: Guarding;Sore Pain Intervention(s): Monitored during session;Premedicated before session;Repositioned    Home  Living                      Prior Function            PT Goals (current goals can now be found in the care plan section) Progress towards PT goals: Progressing toward goals    Frequency    Min 3X/week      PT Plan Current plan remains appropriate    Co-evaluation              AM-PAC PT "6 Clicks" Daily Activity  Outcome Measure  Difficulty turning over in bed (including adjusting bedclothes, sheets and blankets)?:  None Difficulty moving from lying on back to sitting on the side of the bed? : A Little Difficulty sitting down on and standing up from a chair with arms (e.g., wheelchair, bedside commode, etc,.)?: A Little Help needed moving to and from a bed to chair (including a wheelchair)?: A Little Help needed walking in hospital room?: A Little Help needed climbing 3-5 steps with a railing? : A Little 6 Click Score: 19    End of Session Equipment Utilized During Treatment: Gait belt;Oxygen Activity Tolerance: Patient tolerated treatment well Patient left: in chair;with call bell/phone within reach Nurse Communication: Mobility status PT Visit Diagnosis: Unsteadiness on feet (R26.81);Other abnormalities of gait and mobility (R26.89);Muscle weakness (generalized) (M62.81);Pain Pain - Right/Left: Left Pain - part of body: Hand     Time: [id number] PT Time Calculation (min) (ACUTE ONLY): 34 min  Charges:  $Gait Training: 8-22 mins $Therapeutic Activity: 8-22 mins                    G Codes:       Erline LevineKellyn Carlye Panameno, PTA Pager: [phone]     Carolynne EdouardKellyn R Margarita Bobrowski 07/29/2017, 4:38 PM

## 2017-07-29 NOTE — Progress Notes (Signed)
FPTS Interim Progress Note  Hypoxemia Attempted to wean pt off of O2 at 2L. Pt quickly desatted into 70%. It was recently discovered that pt uses CPAP at home. Plan on d/c pt tomorrow with home oxygen.   Garnette Gunnerhompson, Aaron B, MD 07/29/2017, 3:28 PM PGY-1, [facility] pager [phone]

## 2017-07-29 NOTE — Care Management Note (Signed)
Case Management Note Donn PieriniKristi Demetri Kerman RN, BSN Unit 4E-Case Manager [phone]  Patient Details  Name: Sylvia NightDebbie Kessinger MRN: [id number] Date of Birth: [birth year]  Subjective/Objective:    Pt admitted Lt hand infection secondary to soft tissue injury that occurred during a fall ~10 days PTA.  She underwent debridement of Lt hand by ortho on 7/29, and second surgery on 07/27/17                Action/Plan: PTA pt lived at home- independent- per PT/OT evals recommendations for outpt PT/OT- referral has been received for amb. Referral for outpt services- and DME- RW with platform- spoke with pt at bedside who is agreeable to outpt services- discussed locations- and confirmed [facility] outpt [address] location- referral sent via epic to [address] location [facility]- [facility]Karen [facility]- RW with left arm platform to be delivered to room prior to discharge  Expected Discharge Date:                  Expected Discharge Plan:  Home/Self Care  In-House Referral:  NA  Discharge planning Services  CM Consult  Post Acute Care Choice:  Durable Medical Equipment Choice offered to:  Patient  DME Arranged:  Walker rolling DME Agency:  [facility]  HH Arranged:    HH Agency:  NA  Status of Service:  Completed, signed off  If discussed at Long Length of Stay Meetings, dates discussed:    Discharge Disposition: home/self care   Additional Comments:  Darrold SpanWebster, Clio Gerhart Hall, RN 07/29/2017, 2:52 PM

## 2017-07-29 NOTE — Progress Notes (Signed)
Occupational Therapy Treatment Patient Details Name: Venetia NightDebbie Royle MRN: [id number] DOB: 1951/01/30 Today's Date: 07/29/2017    History of present illness This 66 y.o. female admitted with Lt hand infection secondary to soft tissue injury that occurred during a fall ~10 days PTA.  She underwent debridement of Lt hand by ortho on 7/29, and second surgery on 07/27/17.  PMH includes:   CHF, DM, HTN   OT comments  Pt is able to perform ADLs with min A using AE as needed.  She requires min guard to min A for functional mobility.  Her son is supportive and able to provide assistance as needed   Follow Up Recommendations  Outpatient OT;Supervision/Assistance - 24 hour    Equipment Recommendations  3 in 1 bedside commode;Other (comment)    Recommendations for Other Services      Precautions / Restrictions Precautions Precautions: Fall Precaution Comments: Pt denies other falls except for the one that led to her hand injury        Mobility Bed Mobility Overal bed mobility: Needs Assistance Bed Mobility: Supine to Sit   Sidelying to sit: Supervision;HOB elevated          Transfers Overall transfer level: Needs assistance Equipment used: 1 person hand held assist Transfers: Sit to/from Stand;Stand Pivot Transfers Sit to Stand: Min guard Stand pivot transfers: Min assist       General transfer comment: assist for balance     Balance Overall balance assessment: Needs assistance Sitting-balance support: Feet supported Sitting balance-Leahy Scale: Good     Standing balance support: During functional activity;No upper extremity supported Standing balance-Leahy Scale: Fair                             ADL either performed or assessed with clinical judgement   ADL       Grooming: Wash/dry hands;Wash/dry face;Oral care;Brushing hair;Min guard;Standing               Lower Body Dressing: Minimal assistance;Sit to/from stand Lower Body Dressing Details (indicate  cue type and reason): Pt was able to pull pull ups over hips today with min gaurd assist for balance, however  Requires assist for socks.  Son will assist her  Toilet Transfer: Minimal assistance;Ambulation;Comfort height toilet;Grab bars   Toileting- Clothing Manipulation and Hygiene: Minimal assistance;Sit to/from stand Toileting - Clothing Manipulation Details (indicate cue type and reason): min A for balance.      Functional mobility during ADLs: Minimal assistance General ADL Comments: Pt provided with modified dressing stick to assist with LB ADLs      Vision       Perception     Praxis      Cognition Arousal/Alertness: Awake/alert Behavior During Therapy: WFL for tasks assessed/performed Overall Cognitive Status: Within Functional Limits for tasks assessed                                          Exercises Other Exercises Other Exercises: Pt is independent with shoulder and elbow ROM and is independently elevating Lt UE    Shoulder Instructions       General Comments 02 sats 86% on RA, and 89-95% on 2L supplemental 02 with activity     Pertinent Vitals/ Pain       Pain Assessment: Faces Faces Pain Scale: Hurts little more Pain Location: Lt side  with movement  Pain Descriptors / Indicators: Grimacing;Guarding Pain Intervention(s): Monitored during session  Home Living                                          Prior Functioning/Environment              Frequency  Min 3X/week        Progress Toward Goals  OT Goals(current goals can now be found in the care plan section)  Progress towards OT goals: Progressing toward goals     Plan Discharge plan remains appropriate    Co-evaluation                 AM-PAC PT "6 Clicks" Daily Activity     Outcome Measure   Help from another person eating meals?: None Help from another person taking care of personal grooming?: A Little Help from another person  toileting, which includes using toliet, bedpan, or urinal?: A Little Help from another person bathing (including washing, rinsing, drying)?: A Little Help from another person to put on and taking off regular upper body clothing?: A Little Help from another person to put on and taking off regular lower body clothing?: A Little 6 Click Score: 19    End of Session Equipment Utilized During Treatment: Oxygen  OT Visit Diagnosis: Unsteadiness on feet (R26.81);Muscle weakness (generalized) (M62.81)   Activity Tolerance Patient tolerated treatment well   Patient Left in chair;with call bell/phone within reach   Nurse Communication Mobility status        Time: 6045-40981317-1407 OT Time Calculation (min): 50 min  Charges: OT General Charges $OT Visit: 1 Procedure OT Treatments $Self Care/Home Management : 38-52 mins  Reynolds AmericanWendi Helyn Schwan, OTR/L [phone]    Jeani HawkingConarpe, Jazzie Trampe M 07/29/2017, 3:04 PM

## 2017-07-29 NOTE — Progress Notes (Signed)
SATURATION QUALIFICATIONS: (This note is used to comply with regulatory documentation for home oxygen)  Patient Saturations on Room Air at Rest = 86%%  Patient Saturations on Room Air while Ambulating = 86%  Patient Saturations on 2 Liters of oxygen while Ambulating = 89-95%  Please briefly explain why patient needs home oxygen: To maintain 02 saturation >90% as needed to safely perform ADLs and functional mobility.  Jeani HawkingWendi Chidi Shirer, OTR/L  [phone]

## 2017-07-29 NOTE — Progress Notes (Addendum)
Family Medicine Teaching Service Daily Progress Note Intern Pager: [phone]  Patient name: Sylvia Craig Medical record number: [id number] Date of birth: 1951-03-30 Age: 66 y.o. Gender: female  Primary Care Provider: System, Pcp Not In Consultants: hand surgery  Code Status: Full   Pt Overview and Major Events to Date:  Sylvia Craig a 66 y.o.female who presented on 7/29with left hand infection 2/2 mechanical fall 10 days prior to admission. Patient admitted to [facility] on 7/29. S/p Left hand debridement x2 on 7/29 and 8/01.  Assessment and Plan: Sylvia Craig a 66 y.o.female who presented on 7/29with left hand infection 2/2 mechanical fall 10 days prior to admission. PMH is significant for T2DM, HTN, HLD, morbid obesity, vitamin D deficiency, CKD stage 3, iron deficiency anemia, [name] s/p triple vessel cath in 2015 with no stents, CHF NYHA class 2, a fib, and hypothyroidism.  Left hand infection  2/2 mechanical fall. Hand surgery was consulted and performed debridement on 7/29. Currently afebrile. Mild leukocytosis persistent at 13.3. Xray of hand showed no acute osseous abnormality, and soft tissue injury overlying the dorsal aspect of the hand. CXR showing cardiomegaly, and no acute cardiopulmonary process with no fractures (patient reports hitting left chest in fall).Patient given IV vancomycin and IV Zosyn in ED with Code Sepsis order set and started on Ancef by FPTS on 7/29. Preliminary blood cultures showing few gram positive cocci, few gram positive rods, and rare gram negative rods. Show no growth at day 5. Lactic acid 1.33. Wound care consulted prior to debridement and have referred care to hand surgery. Plastic surgery recommendations of Vit C 500 mg daily, Zinc 220 mg daily, increasing protein in diet as tolerated by kidneys, diabetic counseling, and nutrition counseling, elevation of hand, and continued wet to dry dressing changes tid. Plastic surgery performed additional debridement on  8/01. Patient today states improvement. Endorses some pain in hand. States no fever or chills. No swelling or erythema on exam.  -appreciate hand and plastic surgery recommendations  -appreciate wound care recommendations -areobic culture final no growth -anerobic still pending, no growth day  At 5 days -blood cultures pending, no growth at 3 days -transition to po keflex, d/c ancef -Restarted Eliquis at 5 mg. No indication for lower dosage.  -continuous pulse ox  -tylenol 1000 mg q6hrs prn pain    Respiratory distress Likely 2/2 fluid overload due to NS boluses from code sepsis. Patient given 40 mg IV lasix by PACU. Repeat CXR showing no frank edema, and shallow inspiration with atelectasis. Atrovent neg given in PACU. Patient currently satting at 97% on 2L via nasal canula with RR of 17. Current weight 279 lb (dry weight 266 lbs). Patient denies SOB, but endorses cough without sputum production. -strict I's and O's via foley  -continue po lasix 40 mg  -d/c O2  Skin irritation Skin irritation of bilaterally inner thighs. Patient states it is not bothering her.  -continue desitin PRN  T2DM Current CBG of 86, improved from mid 150s o/n. Current A1C of 9.7. Home humalog 6 units TID with meals, Amaryl 4mg  BID, lantus 26 units daily.  -monitor CBGs ACHS -lantus to 18 units at bedtime -moderate SSI  HTN BP of 113/75. Home meds are toprol XL 100mg , cozaar 25 mg. Will dose metoprolol 50mg  BID to avoid rebound tachycardia as BP can tolerate.  -metoprolol immediate release 50 mg bid -continue to monitor   HFpEF Echo 2015 showing EF 45%, Cath 2015 showing EF 35%. Weight on 06/28/2016 266 lb 6.4 oz.  Weight on admission of 267 lbs. Current weight 279 lbs. Followed by Dr. Tami Ribasoss [facility]([place], [place] Home lasix 40 mg BID and losartan 25 mg. Patient initially ordered for 30mg /kg NS boluses, but upon discovery of reduced EF, patient had received 2L NS boluses and additional boluses were discontinued.  Patient was euvolemic on exam.  -continue lasix 40 mg po to start 7/30 -monitor fluid status -I's and O's -consider restarting losartan at discharge   Atrial fibrillation  On home Eliquis 2.5 mg and metoprolol. Unable to appreciate why Eliquis is dosed at 2.5mg  BID (per pharmacy patient would have to have 2 of 3: age 28>80, Cr >1.5, and <100kg). Patient has one of these criteria (Cr >1.5 in recent past). Patient self discontinued Eliquis since the fall on 7/19.  -Re start Elliquis per Aquilla HackerHenry Martensen, PA (hand surgery)  HLD Home crestor 10 mg -continue home crestor  CKD stage 3 Current Cr 1.59. Per chart review baseline ~1.4.  -continue to monitor   Iron deficiency anemia  Current CBC wnl showing Hgb 12.9, Hct 40.8, MCV 97.6. Home iron supplementation.  -no supplementation during acute illness  [name]  Home nitro prn, metoprolol, and crestor. Patient denies current CP.  -continue nitro prn -metoprolol immediate release 50 mg bid  Hypothyroidism  Home synthroid 137 mcg -continue home synthroid   FEN/GI: carb modified  PPx: Eliquis 2.5 mg bid  Disposition: dc home with outpatient OT, PT  Subjective:  Patient today states she is improved. Patient denies pain in hand. States slight headache last night that was alleviated with prn Tylenol. Patient denies fever/chills. Patient denies SOB. Patient has cough with no sputum production. States OT is improving hand functionality and pain.   Objective: Temp:  [97.5 F (36.4 C)-98.7 F (37.1 C)] 97.8 F (36.6 C) (08/03 0542) Pulse Rate:  [79-91] 79 (08/03 0542) Resp:  [16-20] 20 (08/03 0542) BP: (109-138)/(48-69) 109/48 (08/03 0542) SpO2:  [91 %-100 %] 99 % (08/03 0542) Physical Exam: General: awake and alert, NAD, lying in bed  Cardiovascular: RRR, no MRG Respiratory: CTAB, no wheezes, rales, or rhonchi  Abdomen: soft, non tender, non distended, bowel sounds x 4 quadrants  Extremities: soft, no edema, 2 sec capillary  refill bilaterally, left arm wrapped in dressing.   Laboratory:  Recent Labs Lab 07/27/17 1018 07/28/17 0230 07/29/17 0341  WBC 13.3* 11.8* 13.9*  HGB 13.1 12.9 12.7  HCT 41.8 41.0 40.8  PLT 213 242 257    Recent Labs Lab 07/26/17 0553 07/28/17 0230 07/29/17 0341  NA 135 137 139  K 5.1 5.5* 4.7  CL 101 103 102  CO2 28 26 31   BUN 37* 42* 56*  CREATININE 1.59* 1.33* 1.31*  CALCIUM 8.8* 9.0 9.3  GLUCOSE 214* 303* 177*    Imaging/Diagnostic Tests: Dg Chest 2 View  Result Date: 07/24/2017 CLINICAL DATA:  Patient status post fall. EXAM: CHEST  2 VIEW COMPARISON:  None. FINDINGS: Monitoring leads overlie the patient. Cardiomegaly. No consolidative pulmonary opacities. No pleural effusion or pneumothorax. Thoracic spine degenerative changes. IMPRESSION: Cardiomegaly.  No acute cardiopulmonary process. Electronically Signed   By: Annia Beltrew  Davis M.D.   On: 07/24/2017 16:58   Dg Chest Port 1 View  Result Date: 07/24/2017 CLINICAL DATA:  Shortness of breath.  Postoperative patient. EXAM: PORTABLE CHEST 1 VIEW COMPARISON:  07/24/2017 FINDINGS: Shallow inspiration with atelectasis in the lung bases. Cardiac enlargement. Pulmonary vascularity is normal for technique. No consolidation or edema. No blunting of costophrenic angles. No pneumothorax. Calcification of the aorta.  Old healed fracture deformity of the left clavicle. IMPRESSION: Shallow inspiration with atelectasis in the lung bases. Cardiac enlargement. No consolidation. Electronically Signed   By: Burman NievesWilliam  Stevens M.D.   On: 07/24/2017 21:27   Dg Hand Complete Left  Result Date: 07/24/2017 CLINICAL DATA:  Patient status post fall. Left hand wound around the posterior aspect of the carpal bones. Initial encounter. EXAM: LEFT HAND - COMPLETE 3+ VIEW COMPARISON:  None. FINDINGS: Soft tissue defect overlying the dorsal aspect of the hand. Vascular calcifications. Normal anatomic alignment. No evidence for acute fracture or dislocation.  IMPRESSION: No acute osseous abnormality. Soft tissue injury overlying the dorsal aspect of the hand. Electronically Signed   By: Annia Beltrew  Davis M.D.   On: 07/24/2017 16:58     Garnette Gunnerhompson, Aaron B, MD 07/29/2017, 6:29 AM PGY-1, [facility] FPTS Intern pager: [phone], text pages welcome

## 2017-07-30 LAB — BASIC METABOLIC PANEL
Anion gap: 4 — ABNORMAL LOW (ref 5–15)
BUN: 47 mg/dL — AB (ref 6–20)
CALCIUM: 9.1 mg/dL (ref 8.9–10.3)
CO2: 33 mmol/L — ABNORMAL HIGH (ref 22–32)
CREATININE: 1.19 mg/dL — AB (ref 0.44–1.00)
Chloride: 104 mmol/L (ref 101–111)
GFR calc Af Amer: 54 mL/min — ABNORMAL LOW (ref >60)
GFR, EST NON AFRICAN AMERICAN: 46 mL/min — AB (ref >60)
GLUCOSE: 214 mg/dL — AB (ref 65–99)
POTASSIUM: 4.5 mmol/L (ref 3.5–5.1)
SODIUM: 141 mmol/L (ref 135–145)

## 2017-07-30 LAB — GLUCOSE, CAPILLARY
GLUCOSE-CAPILLARY: 157 mg/dL — AB (ref 65–99)
GLUCOSE-CAPILLARY: 102 mg/dL — AB (ref 65–99)

## 2017-07-30 LAB — CBC
HEMATOCRIT: 37.9 % (ref 36.0–46.0)
Hemoglobin: 11.9 g/dL — ABNORMAL LOW (ref 12.0–15.0)
MCH: 31.1 pg (ref 26.0–34.0)
MCHC: 31.4 g/dL (ref 30.0–36.0)
MCV: 99 fL (ref 78.0–100.0)
Platelets: 221 10*3/uL (ref 150–400)
RBC: 3.83 MIL/uL — ABNORMAL LOW (ref 3.87–5.11)
RDW: 13.5 % (ref 11.5–15.5)
WBC: 9.7 10*3/uL (ref 4.0–10.5)

## 2017-07-30 MED ORDER — APIXABAN 2.5 MG PO TABS: 5.0000 mg | ORAL_TABLET | Freq: Two times a day (BID) | ORAL | 3 refills | Status: DC

## 2017-07-30 MED ORDER — ADULT MULTIVITAMIN W/MINERALS CH: 1.0000 | ORAL_TABLET | Freq: Every day | ORAL | 3 refills | Status: DC

## 2017-07-30 MED ORDER — METOPROLOL SUCCINATE ER 100 MG PO TB24: 100.0000 mg | ORAL_TABLET | Freq: Every day | ORAL | 3 refills | Status: DC

## 2017-07-30 MED ORDER — CEPHALEXIN 500 MG PO CAPS: 500.0000 mg | ORAL_CAPSULE | Freq: Two times a day (BID) | ORAL | 0 refills | Status: DC

## 2017-07-30 MED ORDER — ZINC SULFATE 220 (50 ZN) MG PO CAPS: 220.0000 mg | ORAL_CAPSULE | Freq: Every day | ORAL | 3 refills | Status: DC

## 2017-07-30 MED ORDER — METOPROLOL SUCCINATE ER 100 MG PO TB24: 100.0000 mg | ORAL_TABLET | Freq: Two times a day (BID) | ORAL | 3 refills | Status: DC

## 2017-07-30 MED ORDER — FUROSEMIDE 40 MG PO TABS: 40.0000 mg | ORAL_TABLET | Freq: Every day | ORAL | 3 refills | Status: DC

## 2017-07-30 MED ORDER — INSULIN LISPRO 100 UNIT/ML (KWIKPEN): 4.0000 [IU] | PEN_INJECTOR | Freq: Three times a day (TID) | SUBCUTANEOUS | 11 refills | Status: DC

## 2017-07-30 NOTE — Progress Notes (Signed)
Discharged to home with family office visits in place teaching done  

## 2017-07-30 NOTE — Progress Notes (Signed)
SATURATION QUALIFICATIONS: (This note is used to comply with regulatory documentation for home oxygen)  Patient Saturations on Room Air at Rest = 90%  Patient Saturations on Room Air while Ambulating = 82%  Patient Saturations on 2 Liters of oxygen while Ambulating = 89%  Please briefly explain why patient needs home oxygen: 

## 2017-07-30 NOTE — Progress Notes (Addendum)
Patient provided with 30 day Eliquis Card. [facility] [place], prior to DC.

## 2017-07-30 NOTE — Progress Notes (Signed)
Family Medicine Teaching Service Daily Progress Note Intern Pager: [phone]  Patient name: Sylvia Craig Medical record Craig: [id number] Date of birth: [birth year] Age: 66 y.o. Gender: female  Primary Care Provider: System, Pcp Not In Consultants: hand surgery  Code Status: Full   Pt Overview and Major Events to Date:  Sylvia Craig a 66 y.o.female who presented on 7/29with left hand infection 2/2 mechanical fall 10 days prior to admission. [facility] S/p Left hand debridement x2 on 7/29 and 8/01.  Assessment and Plan: Sylvia Craig a 66 y.o.female who presented on 7/29with left hand infection 2/2 mechanical fall 10 days prior to admission. PMH is significant for T2DM, HTN, HLD, morbid obesity, vitamin D deficiency, CKD stage 3, iron deficiency anemia, [name] s/p triple vessel cath in 2015 with no stents, CHF NYHA class 2, a fib, and hypothyroidism.  Left hand infection: 2/2 mechanical fall s/p OR debridement x 2 by plastic surgery.  -continue keflex 500 mg BID, end date 07/31/17 -Continue Eliquis -tylenol 1000 mg q6hrs prn pain   OSA/OHS: Patient with known OSA, has not worn CPAP for several years. Frequent desats to 70s while asleep. O2 sats 90s when awake and sitting. Plan to discharge with O2 until CPAP can be obtained. 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 She also has bruising over her L chest from fall which she states prevents her from taking deep breaths. -will need home O2 via [place] given desaturations -aggressive pulmonary toilet -CPAP QHS  Skin irritation: Skin irritation of bilateral inner thighs.  -continue desitin PRN  T2DM: Current A1C of 9.7. Home humalog 6 units TID with meals, Amaryl 4mg  BID, lantus 26 units daily. Novolog requirement last 24 hours = 6U.  -monitor CBGs ACHS -lantus to 18 units at bedtime -moderate SSI  HTN: Home meds are toprol XL 100mg , cozaar 25 mg.  -metoprolol immediate release  50 mg bid -continue to monitor   HFpEF: Echo 2015 showing EF 45%, Cath 2015 showing EF 35%. Weight on 06/28/2016 266 lb 6.4 oz. Weight on admission of 267 lbs. Current weight 279 lbs. Followed by Dr. Tami Ribasoss [facility]([place], [place]). Home lasix 40 mg BID and losartan 25 mg.  -continue lasix 40 mg po QD -monitor fluid status -I's and O's -consider restarting losartan at discharge   Atrial fibrillation: Home meds are Eliquis 2.5 mg and metoprolol. Unable to appreciate why Eliquis is dosed at 2.5mg  BID (per pharmacy patient would have to have 2 of 3: age 20>80, Cr >1.5, and <100kg). Patient has one of these criteria (Cr >1.5 in recent past). Patient self discontinued Eliquis since the fall on 7/19.  -continue Eliquis   HLD: Home crestor 10 mg -continue home crestor  CKD stage 3: stable. Per chart review baseline ~1.4.  -continue to monitor   Iron deficiency anemia: Home iron supplementation.  -no supplementation during acute illness  Hypothyroidism  Home synthroid 137 mcg -continue home synthroid   FEN/GI: carb modified  PPx: Eliquis  Disposition: dc home with outpatient OT, PT  Subjective: Patient feels well today. States she doesn't take full breaths due to some residual pain over L chest. Understands the need to use O2 until CPAP is found. Wants to establish with our clinic.   Objective: Temp:  [97.7 F (36.5 C)-98 F (36.7 C)] 97.7 F (36.5 C) (08/04 0453) Pulse Rate:  [45-94] 78 (08/04 0453) Resp:  [16-23] 22 (08/04 0453) BP: (113-142)/(62-77) 123/77 (08/04 0453) SpO2:  [90 %-99 %]  98 % (08/04 0453) Physical Exam: General: awake and alert, NAD, sitting up in bed  Cardiovascular: RRR, no MRG Respiratory: CTAB, no wheezes, rales, or rhonchi  Abdomen: soft, non tender, non distended, bowel sounds x 4 quadrants  Extremities: soft, no edema, 2 sec capillary refill bilaterally, left arm wrapped in dressing.   Laboratory:  Recent Labs Lab 07/28/17 0230 07/29/17 0341 07/30/17 0311   WBC 11.8* 13.9* 9.7  HGB 12.9 12.7 11.9*  HCT 41.0 40.8 37.9  PLT 242 257 221    Recent Labs Lab 07/28/17 0230 07/29/17 0341 07/30/17 0311  NA 137 139 141  K 5.5* 4.7 4.5  CL 103 102 104  CO2 26 31 33*  BUN 42* 56* 47*  CREATININE 1.33* 1.31* 1.19*  CALCIUM 9.0 9.3 9.1  GLUCOSE 303* 177* 214*    Imaging/Diagnostic Tests: Dg Chest 2 View  Result Date: 07/24/2017 CLINICAL DATA:  Patient status post fall. EXAM: CHEST  2 VIEW COMPARISON:  None. FINDINGS: Monitoring leads overlie the patient. Cardiomegaly. No consolidative pulmonary opacities. No pleural effusion or pneumothorax. Thoracic spine degenerative changes. IMPRESSION: Cardiomegaly.  No acute cardiopulmonary process. Electronically Signed   By: Annia Beltrew  Davis M.D.   On: 07/24/2017 16:58   Dg Chest Port 1 View  Result Date: 07/24/2017 CLINICAL DATA:  Shortness of breath.  Postoperative patient. EXAM: PORTABLE CHEST 1 VIEW COMPARISON:  07/24/2017 FINDINGS: Shallow inspiration with atelectasis in the lung bases. Cardiac enlargement. Pulmonary vascularity is normal for technique. No consolidation or edema. No blunting of costophrenic angles. No pneumothorax. Calcification of the aorta. Old healed fracture deformity of the left clavicle. IMPRESSION: Shallow inspiration with atelectasis in the lung bases. Cardiac enlargement. No consolidation. Electronically Signed   By: Burman NievesWilliam  Stevens M.D.   On: 07/24/2017 21:27   Dg Hand Complete Left  Result Date: 07/24/2017 CLINICAL DATA:  Patient status post fall. Left hand wound around the posterior aspect of the carpal bones. Initial encounter. EXAM: LEFT HAND - COMPLETE 3+ VIEW COMPARISON:  None. FINDINGS: Soft tissue defect overlying the dorsal aspect of the hand. Vascular calcifications. Normal anatomic alignment. No evidence for acute fracture or dislocation. IMPRESSION: No acute osseous abnormality. Soft tissue injury overlying the dorsal aspect of the hand. Electronically Signed   By: Annia Beltrew   Davis M.D.   On: 07/24/2017 16:58    Sylvia Craig Bignessimberlake, Trinita Devlin, MD 07/30/2017, 10:57 AM PGY-2, [place] Family Medicine FPTS Intern pager: [phone], text pages welcome

## 2017-08-09 ENCOUNTER — Encounter: Payer: Self-pay | Admitting: Family Medicine

## 2017-08-09 ENCOUNTER — Inpatient Hospital Stay: Payer: Medicare Other | Admitting: Family Medicine

## 2017-08-09 ENCOUNTER — Ambulatory Visit (INDEPENDENT_AMBULATORY_CARE_PROVIDER_SITE_OTHER): Payer: Medicare Other | Admitting: Family Medicine

## 2017-08-09 VITALS — BP 128/64 | HR 84 | Temp 97.4°F | Ht 65.0 in | Wt 265.0 lb

## 2017-08-09 DIAGNOSIS — G4733 Obstructive sleep apnea (adult) (pediatric): Secondary | ICD-10-CM | POA: Diagnosis not present

## 2017-08-09 DIAGNOSIS — Z09 Encounter for follow-up examination after completed treatment for conditions other than malignant neoplasm: Secondary | ICD-10-CM | POA: Diagnosis not present

## 2017-08-09 NOTE — Patient Instructions (Addendum)
It was great seeing you today! We have addressed the following issues today  1. Please take Lantus (long acting) 18u daily and 4u of the fast acting 3 times a day with meals 2. Continue with Metoprolol 100 mg daily 3. Continue with Furosemide 40 mg daily 4. Continue with Eliquis 5 mg total daily 5. I will order a sleep study for you 6. Follow up with OT. 7. Make sure you have an appointment with a new PCP.   If we did any lab work today, and the results require attention, either me or my nurse will get in touch with you. If everything is normal, you will get a letter in mail and a message via . If you don't hear from us in two weeks, please give us a call. Otherwise, we look forward to seeing you again at your next visit. If you have any questions or concerns before then, please call the clinic at [phone].  Please bring all your medications to every doctors visit  Sign up for My Chart to have easy access to your labs results, and communication with your Primary care physician. Please ask Front Desk for some assistance.   Please check-out at the front desk before leaving the clinic.    Take Care,   Dr. Sydnee Cabaliallo

## 2017-08-10 ENCOUNTER — Ambulatory Visit: Payer: Medicare Other | Admitting: Occupational Therapy

## 2017-08-11 ENCOUNTER — Encounter: Payer: Self-pay | Admitting: Family Medicine

## 2017-08-11 ENCOUNTER — Ambulatory Visit: Payer: Medicare Other | Attending: Family Medicine | Admitting: Occupational Therapy

## 2017-08-11 DIAGNOSIS — R278 Other lack of coordination: Secondary | ICD-10-CM

## 2017-08-11 DIAGNOSIS — M25642 Stiffness of left hand, not elsewhere classified: Secondary | ICD-10-CM

## 2017-08-11 DIAGNOSIS — M25632 Stiffness of left wrist, not elsewhere classified: Secondary | ICD-10-CM | POA: Diagnosis present

## 2017-08-11 DIAGNOSIS — M6281 Muscle weakness (generalized): Secondary | ICD-10-CM | POA: Diagnosis present

## 2017-08-11 DIAGNOSIS — R2681 Unsteadiness on feet: Secondary | ICD-10-CM | POA: Insufficient documentation

## 2017-08-11 NOTE — Progress Notes (Signed)
Subjective:    Patient ID: Sylvia Craig, female    DOB: 1951-04-07, 66 y.o.   MRN: [id number]   CC: Hospital discharge follow up for left hand infection  HPI: Patient is a 66 yo female with a complex past medical history who present for follow up after hospital stay for left hand infection requiring debridement from a laceration sustained after a fall. Patient has doing well since discharge, she has been changing her dressing daily and will follow up with plastic surgery. She denies any fever, chills, nausea, vomiting, headaches, drainage from wound. Patient has resumed her medications regimen and has no acute complaint today. She completed her Keflex course on 8/5 and will follow up with OT. She is keeping hand elevated as much as possible.  Smoking status reviewed   ROS: all other systems were reviewed and are negative other than in the HPI   Past Medical History:  Diagnosis Date  . CHF (congestive heart failure) (HCC)   . Diabetes mellitus without complication (HCC)   . Hypertension   . Iron (Fe) deficiency anemia   . Thyroid disease     Past Surgical History:  Procedure Laterality Date  . APPLICATION OF A-CELL OF EXTREMITY Left 07/27/2017   Procedure: APPLICATION OF A-CELL;  Surgeon: Peggye Formillingham, Claire S, DO;  Location: [facility] OR;  Service: Plastics;  Laterality: Left;  . I&D EXTREMITY Left 07/24/2017   Procedure: IRRIGATION AND DEBRIDEMENT EXTREMITY;  Surgeon: Sheral ApleyMurphy, Timothy D, MD;  [facility];  Service: Orthopedics;  Laterality: Left;  . I&D EXTREMITY Left 07/27/2017   Procedure: IRRIGATION AND DEBRIDEMENT OF LEFT HAND;  Surgeon: Peggye Formillingham, Claire S, DO;  Location: [facility] OR;  Service: Plastics;  Laterality: Left;    Past medical history, surgical, family, and social history reviewed and updated in the EMR as appropriate.  Objective:  BP 128/64   Pulse 84   Temp (!) 97.4 F (36.3 C) (Oral)   Ht 5\' 5"  (1.651 m)   Wt 265 lb (120.2 kg)   SpO2 92%   BMI 44.10 kg/m   Vitals  and nursing note reviewed  General: NAD, pleasant, able to participate in exam Cardiac: RRR, normal heart sounds, no murmurs. 2+ radial and PT pulses bilaterally Respiratory: CTAB, normal effort, No wheezes, rales or rhonchi Abdomen: soft, nontender, nondistended, no hepatic or splenomegaly, +BS Extremities: no edema or cyanosis. WWP. Did not unwrapped left after recent dressing change. No drainage or pain noted during exam.  Skin: warm and dry, no rashes noted Neuro: alert and oriented x4, no focal deficits Psych: Normal affect and mood   Assessment & Plan:   #Left hand wound s/p debridement Healing well after completing keflex course (8/5), no complication post discharge and no signs of infection. Will continue to monitor. --Continue with daily dressing change --Follow up with Plastic surgery as needed --Keep hand elevated  --Continue with outpatient PT/OT  #T2DM Patient did not bring CBG log. Explained importance of maintaining BG within normal range and how it will affect healing process. --Continue Lantus 18u and Humalog 4u  #Hypertension At goal today 128/64. --Continue with metoprolol 100 mg daily  #Atrial fibrillation  --Continue Eliquis 5 mg total daily (2.5 mg bid)  #OSA Patient required CPAP during hospitalization with new O2 requirment noted whikle asleep. Discharge on 2L but has only needed oxygen intermittently for exertion since discharge. Had CPAP at home but did not use for many years. pateint will need reevaluation for OSA. --Place order for sleep study --Weight loss  discussed  [name], MD [facility] Family Medicine PGY-2

## 2017-08-11 NOTE — Patient Instructions (Signed)
     Summar Sylvia Craig (11-07-1951) was evaluated for occupational therapy 08/11/17.    1.  Please clarify if pt is cleared for unrestricted AROM to wrist (as tolerated).  2.  Is gentle PROM ok?  3.  Is light strengthening to wrist/hand ok as tolerated?  (If not, when do you anticipate that she will be cleared)  4.  Any other precautions?  Please feel free to contact me with any questions.   Thank you,  Willa FraterAngela Freeman, OTR/L [facility] [address] [place], [place]  [phone] phone [phone] 08/11/17 3:24 PM

## 2017-08-11 NOTE — Therapy (Signed)
[facility] [address] [place], [place], [address] Phone: [phone]   Fax:  [phone]  Occupational Therapy Evaluation  Patient Details  Name: Sylvia Craig MRN: [id number] Date of Birth: [birth year] Referring Provider: Dr. Lucile Shutters (Dr. Anders Simmonds)  Encounter Date: 08/11/2017      OT End of Session - 08/11/17 1633    Visit Number 1   Number of Visits 17   Date for OT Re-Evaluation 10/10/17   Authorization Type Medicare (per pt)/ [facility] (no visit limit/auth req); G-code needed   Authorization - Visit Number 1   Authorization - Number of Visits 10   OT Start Time 1455   OT Stop Time 1545   OT Time Calculation (min) 50 min   Activity Tolerance Patient tolerated treatment well   Behavior During Therapy [facility] for tasks assessed/performed      Past Medical History:  Diagnosis Date  . CHF (congestive heart failure) (HCC)   . Diabetes mellitus without complication (HCC)   . Hypertension   . Iron (Fe) deficiency anemia   . Thyroid disease     Past Surgical History:  Procedure Laterality Date  . APPLICATION OF A-CELL OF EXTREMITY Left 07/27/2017   Procedure: APPLICATION OF A-CELL;  Surgeon: Peggye Form, DO;  Location: [facility] OR;  Service: Plastics;  Laterality: Left;  . I&D EXTREMITY Left 07/24/2017   Procedure: IRRIGATION AND DEBRIDEMENT EXTREMITY;  Surgeon: Sheral Apley, MD;  Location: [facility] OR;  Service: Orthopedics;  Laterality: Left;  . I&D EXTREMITY Left 07/27/2017   Procedure: IRRIGATION AND DEBRIDEMENT OF LEFT HAND;  Surgeon: Peggye Form, DO;  Location: [facility] OR;  Service: Plastics;  Laterality: Left;    There were no vitals filed for this visit.      Subjective Assessment - 08/11/17 1456    Subjective  My arm doesn't typically hurt   Pertinent History L hand infection secondary to soft tissue injury that occured during fall (07/14/17) s/p debridement of L hand 07/24/17 and  07/27/17; CHF, DM, HTN, SOB, uses 2L Oxygen at night, sleep apnea   Limitations fall risk, monitor O2 stats with activity, open wound L wrist    Patient Stated Goals incr use of L hand, be able to do crafts   Currently in Pain? Yes   Pain Score 1    Pain Location Rib cage   Pain Orientation Left   Pain Descriptors / Indicators Sore   Pain Type Acute pain   Pain Frequency Intermittent   Aggravating Factors  fatigue, incr activity   Pain Relieving Factors rest, pain meds prn   Effect of Pain on Daily Activities OT will not address rib pain due to location/nature           [facility] OT Assessment - 08/11/17 0001      Assessment   Diagnosis L hand infection, s/p debridement of L hand 7/29 and 07/27/17   Referring Provider Dr. Lucile Shutters (Dr. Anders Simmonds)   Onset Date 07/14/17  fall, debridement of L hand 07/24/17 and 07/27/17   Prior Therapy inpatient OT, PT     Precautions   Precautions Fall  do not get wound wet, uses 2L oxygen at night     Balance Screen   Has the patient fallen in the past 6 months Yes   How many times? 1  with injury, PT recommended     Home  Environment   Family/patient expects to be discharged to: Private residence  Home Access --  2 steps to deck w/ rail on Land 1 step in house   Home Layout --  kitchen 1/2 step up   Lives With Son  and 2 y.o. grandson     Prior Function   Level of Independence Independent   Vocation Retired  OT in Proofreader health   Leisure crafts, cares for 2 y.o. grandson     ADL   Eating/Feeding Set up  difficulty opening some new bottles    Grooming Modified independent   Upper Body Bathing Modified independent  sponge bath with sponge, long handled sponge   Lower Body Bathing --  sponge bath   Upper Body Dressing --  mod I with shirt, unable to don bra   Lower Body Dressing Modified independent  has sock aid and dressing hook   Toilet Transfer Modified independent   Toileting - Clothing Manipulation Modified  independent   Toileting -  Engineer, mining --  hasn't attempted   Psychologist, educational Grab bars  has tub bench   ADL comments cares for grandson alone for short periods of time, lifting primarily with RUE, L upper arm, able to change diaper with child crawling on bed     IADL   Prior Level of Function Shopping pt or son performed   Shopping --  son doing now due to [employer] --  washing dishes with L hand covered now   Prior Level of Function Meal Prep pt performed   Meal Prep --  microwave, cold meal prep   Prior Level of Function Community Mobility driving prior   [employer] Relies on family or friends for transportation   Medication Management --  needs help with opening bottles, able to due insulin     Mobility   Mobility Status History of falls   Mobility Status Comments RW with forearm support for LUE, also furniture walking some     Written Expression   Dominant Hand Right     Vision - History   Baseline Vision --  has reading glasses, but doesn't use consistently   Visual History --  hx of catract surgery   Patient Visual Report --     Cognition   Overall Cognitive Status Within Functional Limits for tasks assessed     Observation/Other Assessments   Skin Integrity approx 3" by 2" wound on dorsal wrist/forarm covered by non-adherant dressing (that pt is to leave in place until next appt), then covered by dry gauze and ace wrap.  Pt with evidence of minimal drainage, but no odor present.  MD appt tomorrow.  Pt completes gauze changes daily, but was instructed to keep wound dry.     Sensation   Additional Comments pt denies numbness/tingling     Coordination   9 Hole Peg Test Right;Left   Right 9 Hole Peg Test 39.44   Left 9 Hole Peg Test 30.93     Edema   Edema minimal edema noted in L hand/fingers     ROM / Strength   AROM / PROM / Strength AROM     AROM   Overall AROM   Deficits   Overall AROM Comments wrist flex 30*, wrist ext 20*, supination approx 75%, gross finger ext 90%, gross finger flex approx 60%, able to oppose to each digit, but not to base of 5th digit     Hand Function   Left Hand Grip (lbs) --  not tested  due to awaiting clarification re: precautions                         OT Education - 08/11/17 1614    Education provided Yes   Education Details OT POC; Precautions for falls/Recommendation for OT; Concerns regarding balance/using LUE as assist to lift grandson; Note for MD visit tomorrow clarifying precautions (see pt instructions)   Person(s) Educated Patient   Methods Explanation;Handout   Comprehension Verbalized understanding          OT Short Term Goals - 08/11/17 1649      OT SHORT TERM GOAL #1   Title Pt will be independent with initial HEP for ROM and edema management techniques prn.--check STGs 09/10/17   Time 4   Period Weeks   Status New     OT SHORT TERM GOAL #2   Title Pt will demo at least 45* L wrist flex/ext for ADLs.   Baseline flex 30*, ext 20*   Time 4   Period Weeks   Status New     OT SHORT TERM GOAL #3   Title Pt will demo at least 75% gross finger flex for grasping objects.   Period Weeks   Status New     OT SHORT TERM GOAL #4   Title Pt will be able to don bra and fasten mod I.   Time 4   Period Weeks   Status New     OT SHORT TERM GOAL #5   Title Pt will demo at least 85* supination to assist with ADLs/IADLs.   Time 8   Period Weeks   Status New     Additional Short Term Goals   Additional Short Term Goals --           OT Long Term Goals - 08/11/17 1654      OT LONG TERM GOAL #1   Title Pt will be independent with strengthening HEP.--check LTGs 10/10/17   Period Weeks   Status New     OT LONG TERM GOAL #2   Title Pt be able to use LUE as non-dominant assist for ADLs/IADLs at least 90% of the time.   Time 8   Period Weeks   Status New     OT LONG TERM  GOAL #3   Title Pt will demo at least 55* L wrist flex/ext for ADLs.   Time 8   Period Weeks   Status New     OT LONG TERM GOAL #4   Title Pt will perform simple cooking task mod I.   Time 8   Period Weeks   Status New     OT LONG TERM GOAL #5   Title Pt will demo at least 30lbs L grip strength for opening containers/assist in lifting grandson.   Time 8   Period Weeks   Status New     Long Term Additional Goals   Additional Long Term Goals Yes     OT LONG TERM GOAL #6   Title Pt will perform various simple home maintenance tasks for at least 20 min with good safety/balance, LUE as nondominant assist without rest/maintaing O2 saturation of at least 90%.   Time 8   Period Weeks   Status New               Plan - 08/11/17 1636    Clinical Impression Statement Pt presents today with decr ROM L hand/wrist/forearm, decr non-dominant LUE functional use,  decr coordination, decr strength, decr balance for ADLs/IADLs, hx of fall with injury, and decr activity tolerane.    Pt would benefit from occupational therapy to improve ADL/IADL performance and return to prior level of functioning.   Occupational Profile and client history currently impacting functional performance Pt is a 65 y.o. female with diagnosis of L hand infection secondary to soft tissue injury that occured during fall, s/p debridement of L hand 07/24/17 and 07/27/17.  Pt also with PMH that includes:  CHF, DM, HTN, SOB, sleep apnea, uses 2L oxygen at night.  Pt was completely independent prior to fall/hospitalization, but now is walking with RW with forearm support, and has difficulty with ADLs/needs assistance with IADLs.  Pt enjoyed crafts and caring for grandson, but is limited in doing so at this time.  Pt also has been using Oxygen at night.     Occupational performance deficits (Please refer to evaluation for details): ADL's;IADL's;Leisure;Social Participation   Rehab Potential Good   Current Impairments/barriers  affecting progress: open wound on L dorsal wrist   OT Frequency 2x / week   OT Duration 8 weeks  +eval   OT Treatment/Interventions Self-care/ADL training;Ultrasound;Cryotherapy;Parrafin;Electrical Stimulation;Neuromuscular education;Manual Therapy;Passive range of motion;Building services engineer;Therapeutic activities;Therapeutic exercise;Therapeutic exercises;Moist Heat;Fluidtherapy;DME and/or AE instruction;Splinting;Patient/family education   Plan AROM HEP/gentle PROM (note was sent with pt to clarify precautions for MD appt 08/12/17--pt to return note), check to see PT referral received and schedule PT   Clinical Decision Making Several treatment options, min-mod task modification necessary   Recommended Other Services Pt would benefit from PT due to decr balance and hx of fall with injury--also recommended by MD but error in order entry in Epic (pt to request order from MD tomorrow at appt)   Consulted and Agree with Plan of Care Patient      Patient will benefit from skilled therapeutic intervention in order to improve the following deficits and impairments:  Decreased coordination, Decreased range of motion, Increased edema, Decreased activity tolerance, Decreased skin integrity, Impaired UE functional use, Decreased knowledge of use of DME, Decreased balance, Decreased mobility, Decreased strength  Visit Diagnosis: Stiffness of left hand, not elsewhere classified - Plan: Ot plan of care cert/re-cert  Stiffness of joint of left forearm - Plan: Ot plan of care cert/re-cert  Stiffness of left wrist, not elsewhere classified - Plan: Ot plan of care cert/re-cert  Muscle weakness (generalized) - Plan: Ot plan of care cert/re-cert  Other lack of coordination - Plan: Ot plan of care cert/re-cert  Unsteadiness on feet - Plan: Ot plan of care cert/re-cert      G-Codes - 09/01/2017 1704    Functional Assessment Tool Used (Outpatient only) unable to use LUE consistently as nondominant  assist, needs assist for IADLs   Functional Limitation Self care   Self Care Current Status (R6045) At least 40 percent but less than 60 percent impaired, limited or restricted   Self Care Goal Status (W0981) At least 1 percent but less than 20 percent impaired, limited or restricted      Problem List Patient Active Problem List   Diagnosis Date Noted  . Unsteady gait 09/01/2017  . Hypoxemia   . Obstructive sleep apnea   . Necrotic wound of left hand (HCC)   . SOB (shortness of breath)   . Morbid obesity (HCC) 07/26/2017  . Open wound of hand with complication 07/25/2017  . Cellulitis of hand   . Uncontrolled type 2 diabetes mellitus with ketoacidosis without coma (HCC)   .  Chronic diastolic CHF (congestive heart failure) (HCC)   . Cellulitis 07/24/2017    Polaris Surgery CenterFREEMAN,Dessie Tatem 08/11/2017, 5:11 PM  [facility] [address] [place], [place], [address] Phone: [phone]   Fax:  [phone]  Name: Venetia NightDebbie Craig MRN: [id number] Date of Birth: [birth year]   Willa FraterAngela Saron Tweed, OTR/L [facility] [address] [place], [place]  [id number] [phone] phone [phone] 08/11/17 5:11 PM

## 2017-08-17 ENCOUNTER — Telehealth: Payer: Self-pay | Admitting: *Deleted

## 2017-08-17 ENCOUNTER — Ambulatory Visit: Payer: Medicare Other | Admitting: Occupational Therapy

## 2017-08-17 DIAGNOSIS — M25642 Stiffness of left hand, not elsewhere classified: Secondary | ICD-10-CM | POA: Diagnosis not present

## 2017-08-17 DIAGNOSIS — R278 Other lack of coordination: Secondary | ICD-10-CM

## 2017-08-17 DIAGNOSIS — M6281 Muscle weakness (generalized): Secondary | ICD-10-CM

## 2017-08-17 DIAGNOSIS — M25632 Stiffness of left wrist, not elsewhere classified: Secondary | ICD-10-CM

## 2017-08-17 NOTE — Telephone Encounter (Signed)
Patient has already been scheduled for her OT and PT appts. Jazmin Hartsell,CMA

## 2017-08-17 NOTE — Telephone Encounter (Signed)
LM for patient to call back. Jazmin Hartsell,CMA  

## 2017-08-17 NOTE — Patient Instructions (Signed)
Flexor Tendon Gliding (Active Hook Fist)   With fingers and knuckles straight, bend middle and tip joints. Do not bend large knuckles. Repeat _10-15___ times. Do _4-6___ sessions per day.  MP Flexion (Active)   With back of hand on table, bend large knuckles as far as they will go, keeping small joints straight. Repeat _10-15___ times. Do __4-6__ sessions per day. Activity: Reach into a narrow container.*      Finger Flexion / Extension   With palm up, bend fingers of left hand toward palm, making a  fist. Straighten fingers, opening fist. Repeat sequence _10-15___ times per session. Do _4-6__ sessions per day. You may assist with your other hand gently to bend as well. Hand Variation: Palm down   Copyright  VHI. All rights reserved.  AROM: Wrist Extension   .  With __left__ palm down, bend wrist up. Repeat __15__ times per set.  Do __4-6__ sessions per day.       AROM: Forearm Pronation / Supination   With left____ arm in handshake position, slowly rotate palm down until stretch is felt. Relax. Then rotate palm up until stretch is felt. Repeat _15___ times per set. Do _4-6___ sessions per day.  Copyright  VHI. All rights reserved.         Marland Kitchen

## 2017-08-17 NOTE — Telephone Encounter (Signed)
Patient left voice messages on nurse line needing someone to give her a call regarding the referral for physical therapy.  Please give her a call at [phone]. Clovis Pu, RN

## 2017-08-17 NOTE — Therapy (Signed)
[facility] [address] [place], [place], [address] Phone: [phone]   Fax:  [phone]  Occupational Therapy Treatment  Patient Details  Name: Sylvia Craig MRN: [id number] Date of Birth: [birth year] Referring Provider: Dr. Lucile Shutters (Dr. Anders Simmonds)  Encounter Date: 08/17/2017      OT End of Session - 08/17/17 1402    Visit Number 2   Number of Visits 17   Date for OT Re-Evaluation 10/10/17   Authorization Type Medicare (per pt)/ [facility] State (no visit limit/auth req); G-code needed   Authorization - Visit Number 2   Authorization - Number of Visits 10   OT Start Time 1025  pt late 2 units   OT Stop Time 1100   OT Time Calculation (min) 35 min   Activity Tolerance Patient tolerated treatment well   Behavior During Therapy [facility] for tasks assessed/performed      Past Medical History:  Diagnosis Date  . CHF (congestive heart failure) (HCC)   . Diabetes mellitus without complication (HCC)   . Hypertension   . Iron (Fe) deficiency anemia   . Thyroid disease     Past Surgical History:  Procedure Laterality Date  . APPLICATION OF A-CELL OF EXTREMITY Left 07/27/2017   Procedure: APPLICATION OF A-CELL;  Surgeon: Peggye Form, DO;  Location: [facility] OR;  Service: Plastics;  Laterality: Left;  . I&D EXTREMITY Left 07/24/2017   Procedure: IRRIGATION AND DEBRIDEMENT EXTREMITY;  Surgeon: Sheral Apley, MD;  [facility];  Service: Orthopedics;  Laterality: Left;  . I&D EXTREMITY Left 07/27/2017   Procedure: IRRIGATION AND DEBRIDEMENT OF LEFT HAND;  Surgeon: Peggye Form, DO;  Location: [facility] OR;  Service: Plastics;  Laterality: Left;    There were no vitals filed for this visit.      Subjective Assessment - 08/17/17 1601    Subjective  Pt reports only mild pain   Pertinent History L hand infection secondary to soft tissue injury that occured during fall (07/14/17) s/p debridement of L hand  07/24/17 and 07/27/17; CHF, DM, HTN, SOB, uses 2L Oxygen at night, sleep apnea   Limitations fall risk, monitor O2 stats with activity, open wound L wrist    Patient Stated Goals incr use of L hand, be able to do crafts   Currently in Pain? Yes   Pain Score 2    Pain Location Wrist   Pain Orientation Left   Pain Descriptors / Indicators Aching   Pain Type Acute pain   Pain Onset In the past 7 days   Pain Frequency Intermittent   Aggravating Factors  movement   Pain Relieving Factors rest           Treatment: Pt arrived with left hand and wrist in protective dressing and ace wrap. Dressing was removed with the exception of wound covering that was placed by MD(pt was instructed not to remove).  Fresh 2x2 and gauze applied. Pt was instructed in HEP. Gentle P/ROM and retrograde massage to fingers then ace bandage was applied.                   OT Education - 08/17/17 1559    Education provided Yes   Education Details A/ROM HEP and gentle composite flexion P/ROM- see pt instructions   Person(s) Educated Patient   Methods Explanation;Demonstration;Handout;Verbal cues   Comprehension Verbalized understanding;Returned demonstration;Verbal cues required          OT Short Term Goals - 08/11/17 1649  OT SHORT TERM GOAL #1   Title Pt will be independent with initial HEP for ROM and edema management techniques prn.--check STGs 09/10/17   Time 4   Period Weeks   Status New     OT SHORT TERM GOAL #2   Title Pt will demo at least 45* L wrist flex/ext for ADLs.   Baseline flex 30*, ext 20*   Time 4   Period Weeks   Status New     OT SHORT TERM GOAL #3   Title Pt will demo at least 75% gross finger flex for grasping objects.   Period Weeks   Status New     OT SHORT TERM GOAL #4   Title Pt will be able to don bra and fasten mod I.   Time 4   Period Weeks   Status New     OT SHORT TERM GOAL #5   Title Pt will demo at least 85* supination to assist with  ADLs/IADLs.   Time 8   Period Weeks   Status New     Additional Short Term Goals   Additional Short Term Goals --           OT Long Term Goals - 08/11/17 1654      OT LONG TERM GOAL #1   Title Pt will be independent with strengthening HEP.--check LTGs 10/10/17   Period Weeks   Status New     OT LONG TERM GOAL #2   Title Pt be able to use LUE as non-dominant assist for ADLs/IADLs at least 90% of the time.   Time 8   Period Weeks   Status New     OT LONG TERM GOAL #3   Title Pt will demo at least 55* L wrist flex/ext for ADLs.   Time 8   Period Weeks   Status New     OT LONG TERM GOAL #4   Title Pt will perform simple cooking task mod I.   Time 8   Period Weeks   Status New     OT LONG TERM GOAL #5   Title Pt will demo at least 30lbs L grip strength for opening containers/assist in lifting grandson.   Time 8   Period Weeks   Status New     Long Term Additional Goals   Additional Long Term Goals Yes     OT LONG TERM GOAL #6   Title Pt will perform various simple home maintenance tasks for at least 20 min with good safety/balance, LUE as nondominant assist without rest/maintaing O2 saturation of at least 90%.   Time 8   Period Weeks   Status New               Plan - 08/17/17 1403    Clinical Impression Statement Pt is progressing towards goals. She demonstrates increased A/ROM  following exercising.   Rehab Potential Good   Current Impairments/barriers affecting progress: open wound on L dorsal wrist   OT Frequency 2x / week   OT Duration 8 weeks   OT Treatment/Interventions Self-care/ADL training;Ultrasound;Cryotherapy;Parrafin;Electrical Stimulation;Neuromuscular education;Manual Therapy;Passive range of motion;Building services engineer;Therapeutic activities;Therapeutic exercise;Therapeutic exercises;Moist Heat;Fluidtherapy;DME and/or AE instruction;Splinting;Patient/family education   Plan continue A/ROM and gentl P/ROM, pt has no restrictions,  ROM as tolerated.   Consulted and Agree with Plan of Care Patient      Patient will benefit from skilled therapeutic intervention in order to improve the following deficits and impairments:  Decreased coordination, Decreased range of motion, Increased  edema, Decreased activity tolerance, Decreased skin integrity, Impaired UE functional use, Decreased knowledge of use of DME, Decreased balance, Decreased mobility, Decreased strength  Visit Diagnosis: Stiffness of left hand, not elsewhere classified  Stiffness of joint of left forearm  Stiffness of left wrist, not elsewhere classified  Muscle weakness (generalized)  Other lack of coordination    Problem List Patient Active Problem List   Diagnosis Date Noted  . Unsteady gait 08/11/2017  . Hypoxemia   . Obstructive sleep apnea   . Necrotic wound of left hand (HCC)   . SOB (shortness of breath)   . Morbid obesity (HCC) 07/26/2017  . Open wound of hand with complication 07/25/2017  . Cellulitis of hand   . Uncontrolled type 2 diabetes mellitus with ketoacidosis without coma (HCC)   . Chronic diastolic CHF (congestive heart failure) (HCC)   . Cellulitis 07/24/2017    RINE,KATHRYN 08/17/2017, 4:02 PM  [facility] [address] [place], [place], [address] Phone: [phone]   Fax:  [phone]  Name: Tanmayi Pendley MRN: [id number] Date of Birth: February 01, 1951

## 2017-08-18 ENCOUNTER — Ambulatory Visit: Payer: Medicare Other | Admitting: Occupational Therapy

## 2017-08-18 DIAGNOSIS — M25632 Stiffness of left wrist, not elsewhere classified: Secondary | ICD-10-CM

## 2017-08-18 DIAGNOSIS — M25642 Stiffness of left hand, not elsewhere classified: Secondary | ICD-10-CM | POA: Diagnosis not present

## 2017-08-18 DIAGNOSIS — M6281 Muscle weakness (generalized): Secondary | ICD-10-CM

## 2017-08-18 NOTE — Therapy (Signed)
[facility] [address] [place], [place], [address] Phone: [phone]   Fax:  [phone]  Occupational Therapy Treatment  Patient Details  Name: Sylvia Craig MRN: [id number] Date of Birth: Nov 05, 1951 Referring Provider: Dr. Lucile Shutters (Dr. Anders Simmonds)  Encounter Date: 08/18/2017      OT End of Session - 08/18/17 1652    Visit Number 3   Number of Visits 17   Date for OT Re-Evaluation 10/10/17   Authorization Type Medicare (per pt)/ [facility] State (no visit limit/auth req); G-code needed   Authorization - Visit Number 3   Authorization - Number of Visits 10   OT Start Time 1450   OT Stop Time 1530   OT Time Calculation (min) 40 min   Activity Tolerance Patient tolerated treatment well   Behavior During Therapy [facility] for tasks assessed/performed      Past Medical History:  Diagnosis Date  . CHF (congestive heart failure) (HCC)   . Diabetes mellitus without complication (HCC)   . Hypertension   . Iron (Fe) deficiency anemia   . Thyroid disease     Past Surgical History:  Procedure Laterality Date  . APPLICATION OF A-CELL OF EXTREMITY Left 07/27/2017   Procedure: APPLICATION OF A-CELL;  Surgeon: Peggye Form, DO;  Location: [facility] OR;  Service: Plastics;  Laterality: Left;  . I&D EXTREMITY Left 07/24/2017   Procedure: IRRIGATION AND DEBRIDEMENT EXTREMITY;  Surgeon: Sheral Apley, MD;  Location: [facility] OR;  Service: Orthopedics;  Laterality: Left;  . I&D EXTREMITY Left 07/27/2017   Procedure: IRRIGATION AND DEBRIDEMENT OF LEFT HAND;  Surgeon: Peggye Form, DO;  Location: [facility] OR;  Service: Plastics;  Laterality: Left;    There were no vitals filed for this visit.      Subjective Assessment - 08/17/17 1601    Subjective  Pt reports only mild pain   Pertinent History L hand infection secondary to soft tissue injury that occured during fall (07/14/17) s/p debridement of L hand 07/24/17 and 07/27/17;  CHF, DM, HTN, SOB, uses 2L Oxygen at night, sleep apnea   Limitations fall risk, monitor O2 stats with activity, open wound L wrist    Patient Stated Goals incr use of L hand, be able to do crafts   Currently in Pain? Yes   Pain Score 2    Pain Location Wrist   Pain Orientation Left   Pain Descriptors / Indicators Aching   Pain Type Acute pain   Pain Onset In the past 7 days   Pain Frequency Intermittent   Aggravating Factors  movement   Pain Relieving Factors rest            [facility] OT Assessment - 08/18/17 0001      Precautions   Precaution Comments per MD, no restrictions, ROM as tolerated                 Treatment: Pt's ace bandage and dressing were removed leaving only mesh wound cover in place, Pt's hand was cleaned with soap and water around wound and dried thoroughly. Pt's wound was dressed with several 2x2 topped with gauze and edema glove. Reviewed a/ROM HEP, and therapist provided gentle P/ROM composite finger flexion. Pt placed and removed large pegs from pegboard for increased LUE functional use, with min difficulty          OT Education - 08/18/17 1655    Education provided Yes   Education Details edema glove, pt plans to waer at  night, pt was instructed to remove if it feels too tight   Person(s) Educated Patient   Methods Explanation;Demonstration;Verbal cues   Comprehension Verbalized understanding          OT Short Term Goals - 08/11/17 1649      OT SHORT TERM GOAL #1   Title Pt will be independent with initial HEP for ROM and edema management techniques prn.--check STGs 09/10/17   Time 4   Period Weeks   Status New     OT SHORT TERM GOAL #2   Title Pt will demo at least 45* L wrist flex/ext for ADLs.   Baseline flex 30*, ext 20*   Time 4   Period Weeks   Status New     OT SHORT TERM GOAL #3   Title Pt will demo at least 75% gross finger flex for grasping objects.   Period Weeks   Status New     OT SHORT TERM GOAL #4   Title  Pt will be able to don bra and fasten mod I.   Time 4   Period Weeks   Status New     OT SHORT TERM GOAL #5   Title Pt will demo at least 85* supination to assist with ADLs/IADLs.   Time 8   Period Weeks   Status New     Additional Short Term Goals   Additional Short Term Goals --           OT Long Term Goals - 08/11/17 1654      OT LONG TERM GOAL #1   Title Pt will be independent with strengthening HEP.--check LTGs 10/10/17   Period Weeks   Status New     OT LONG TERM GOAL #2   Title Pt be able to use LUE as non-dominant assist for ADLs/IADLs at least 90% of the time.   Time 8   Period Weeks   Status New     OT LONG TERM GOAL #3   Title Pt will demo at least 55* L wrist flex/ext for ADLs.   Time 8   Period Weeks   Status New     OT LONG TERM GOAL #4   Title Pt will perform simple cooking task mod I.   Time 8   Period Weeks   Status New     OT LONG TERM GOAL #5   Title Pt will demo at least 30lbs L grip strength for opening containers/assist in lifting grandson.   Time 8   Period Weeks   Status New     Long Term Additional Goals   Additional Long Term Goals Yes     OT LONG TERM GOAL #6   Title Pt will perform various simple home maintenance tasks for at least 20 min with good safety/balance, LUE as nondominant assist without rest/maintaing O2 saturation of at least 90%.   Time 8   Period Weeks   Status New               Plan - 08/18/17 1653    Clinical Impression Statement Pt is progressing towards goals. She reports that edema glove feels comfortable. Pt reports doing exercises at home.   Rehab Potential Good   Current Impairments/barriers affecting progress: open wound on L dorsal wrist   OT Duration 8 weeks   OT Treatment/Interventions Self-care/ADL training;Ultrasound;Cryotherapy;Parrafin;Electrical Stimulation;Neuromuscular education;Manual Therapy;Passive range of motion;Building services engineer;Therapeutic activities;Therapeutic  exercise;Therapeutic exercises;Moist Heat;Fluidtherapy;DME and/or AE instruction;Splinting;Patient/family education   Plan check on how edema glove  is working, A/ROM, P/ROM   Consulted and Agree with Plan of Care Patient      Patient will benefit from skilled therapeutic intervention in order to improve the following deficits and impairments:  Decreased coordination, Decreased range of motion, Increased edema, Decreased activity tolerance, Decreased skin integrity, Impaired UE functional use, Decreased knowledge of use of DME, Decreased balance, Decreased mobility, Decreased strength  Visit Diagnosis: Stiffness of joint of left forearm  Stiffness of left wrist, not elsewhere classified  Muscle weakness (generalized)    Problem List Patient Active Problem List   Diagnosis Date Noted  . Unsteady gait 08/11/2017  . Hypoxemia   . Obstructive sleep apnea   . Necrotic wound of left hand (HCC)   . SOB (shortness of breath)   . Morbid obesity (HCC) 07/26/2017  . Open wound of hand with complication 07/25/2017  . Cellulitis of hand   . Uncontrolled type 2 diabetes mellitus with ketoacidosis without coma (HCC)   . Chronic diastolic CHF (congestive heart failure) (HCC)   . Cellulitis 07/24/2017    Sylvia Craig 08/18/2017, 4:56 PM  [facility] [address] [place], [place], [address] Phone: [phone]   Fax:  [phone]  Name: Sylvia Craig MRN: [id number] Date of Birth: December 07, 1951

## 2017-08-25 ENCOUNTER — Encounter (HOSPITAL_COMMUNITY): Payer: Self-pay | Admitting: Family Medicine

## 2017-08-25 ENCOUNTER — Emergency Department (HOSPITAL_COMMUNITY): Payer: Medicare Other

## 2017-08-25 ENCOUNTER — Inpatient Hospital Stay (HOSPITAL_COMMUNITY): Payer: Medicare Other

## 2017-08-25 ENCOUNTER — Inpatient Hospital Stay (HOSPITAL_COMMUNITY)
Admission: EM | Admit: 2017-08-25 | Discharge: 2017-09-05 | DRG: 871 | Disposition: A | Discharge status: Home / Self Care | Payer: Medicare Other | Attending: Family Medicine | Admitting: Family Medicine

## 2017-08-25 DIAGNOSIS — I2510 Atherosclerotic heart disease of native coronary artery without angina pectoris: Secondary | ICD-10-CM | POA: Diagnosis present

## 2017-08-25 DIAGNOSIS — Z885 Allergy status to narcotic agent status: Secondary | ICD-10-CM

## 2017-08-25 DIAGNOSIS — I25119 Atherosclerotic heart disease of native coronary artery with unspecified angina pectoris: Secondary | ICD-10-CM | POA: Diagnosis present

## 2017-08-25 DIAGNOSIS — I4891 Unspecified atrial fibrillation: Secondary | ICD-10-CM | POA: Diagnosis present

## 2017-08-25 DIAGNOSIS — E1110 Type 2 diabetes mellitus with ketoacidosis without coma: Secondary | ICD-10-CM | POA: Diagnosis present

## 2017-08-25 DIAGNOSIS — I482 Chronic atrial fibrillation: Secondary | ICD-10-CM | POA: Diagnosis present

## 2017-08-25 DIAGNOSIS — J9621 Acute and chronic respiratory failure with hypoxia: Secondary | ICD-10-CM | POA: Diagnosis not present

## 2017-08-25 DIAGNOSIS — E1122 Type 2 diabetes mellitus with diabetic chronic kidney disease: Secondary | ICD-10-CM | POA: Diagnosis present

## 2017-08-25 DIAGNOSIS — I5043 Acute on chronic combined systolic (congestive) and diastolic (congestive) heart failure: Secondary | ICD-10-CM | POA: Diagnosis present

## 2017-08-25 DIAGNOSIS — L98419 Non-pressure chronic ulcer of buttock with unspecified severity: Secondary | ICD-10-CM | POA: Diagnosis not present

## 2017-08-25 DIAGNOSIS — Z794 Long term (current) use of insulin: Secondary | ICD-10-CM

## 2017-08-25 DIAGNOSIS — R778 Other specified abnormalities of plasma proteins: Secondary | ICD-10-CM | POA: Diagnosis present

## 2017-08-25 DIAGNOSIS — S81809A Unspecified open wound, unspecified lower leg, initial encounter: Secondary | ICD-10-CM | POA: Diagnosis present

## 2017-08-25 DIAGNOSIS — R296 Repeated falls: Secondary | ICD-10-CM | POA: Diagnosis present

## 2017-08-25 DIAGNOSIS — B9620 Unspecified Escherichia coli [E. coli] as the cause of diseases classified elsewhere: Secondary | ICD-10-CM | POA: Diagnosis not present

## 2017-08-25 DIAGNOSIS — R7989 Other specified abnormal findings of blood chemistry: Secondary | ICD-10-CM | POA: Diagnosis present

## 2017-08-25 DIAGNOSIS — E11622 Type 2 diabetes mellitus with other skin ulcer: Secondary | ICD-10-CM | POA: Diagnosis present

## 2017-08-25 DIAGNOSIS — J9602 Acute respiratory failure with hypercapnia: Secondary | ICD-10-CM

## 2017-08-25 DIAGNOSIS — I272 Pulmonary hypertension, unspecified: Secondary | ICD-10-CM | POA: Diagnosis present

## 2017-08-25 DIAGNOSIS — Z9981 Dependence on supplemental oxygen: Secondary | ICD-10-CM

## 2017-08-25 DIAGNOSIS — Z888 Allergy status to other drugs, medicaments and biological substances status: Secondary | ICD-10-CM

## 2017-08-25 DIAGNOSIS — N390 Urinary tract infection, site not specified: Secondary | ICD-10-CM | POA: Diagnosis present

## 2017-08-25 DIAGNOSIS — E1151 Type 2 diabetes mellitus with diabetic peripheral angiopathy without gangrene: Secondary | ICD-10-CM | POA: Diagnosis present

## 2017-08-25 DIAGNOSIS — I130 Hypertensive heart and chronic kidney disease with heart failure and stage 1 through stage 4 chronic kidney disease, or unspecified chronic kidney disease: Secondary | ICD-10-CM | POA: Diagnosis present

## 2017-08-25 DIAGNOSIS — E039 Hypothyroidism, unspecified: Secondary | ICD-10-CM | POA: Diagnosis present

## 2017-08-25 DIAGNOSIS — I340 Nonrheumatic mitral (valve) insufficiency: Secondary | ICD-10-CM | POA: Diagnosis present

## 2017-08-25 DIAGNOSIS — N179 Acute kidney failure, unspecified: Secondary | ICD-10-CM | POA: Diagnosis present

## 2017-08-25 DIAGNOSIS — Z91013 Allergy to seafood: Secondary | ICD-10-CM

## 2017-08-25 DIAGNOSIS — I248 Other forms of acute ischemic heart disease: Secondary | ICD-10-CM | POA: Diagnosis present

## 2017-08-25 DIAGNOSIS — G4733 Obstructive sleep apnea (adult) (pediatric): Secondary | ICD-10-CM | POA: Diagnosis present

## 2017-08-25 DIAGNOSIS — R0602 Shortness of breath: Secondary | ICD-10-CM | POA: Diagnosis not present

## 2017-08-25 DIAGNOSIS — A4151 Sepsis due to Escherichia coli [E. coli]: Secondary | ICD-10-CM | POA: Diagnosis not present

## 2017-08-25 DIAGNOSIS — I361 Nonrheumatic tricuspid (valve) insufficiency: Secondary | ICD-10-CM | POA: Diagnosis not present

## 2017-08-25 DIAGNOSIS — R6520 Severe sepsis without septic shock: Secondary | ICD-10-CM | POA: Diagnosis present

## 2017-08-25 DIAGNOSIS — D696 Thrombocytopenia, unspecified: Secondary | ICD-10-CM | POA: Diagnosis present

## 2017-08-25 DIAGNOSIS — R0902 Hypoxemia: Secondary | ICD-10-CM | POA: Diagnosis not present

## 2017-08-25 DIAGNOSIS — R748 Abnormal levels of other serum enzymes: Secondary | ICD-10-CM | POA: Diagnosis not present

## 2017-08-25 DIAGNOSIS — E861 Hypovolemia: Secondary | ICD-10-CM | POA: Diagnosis present

## 2017-08-25 DIAGNOSIS — R0689 Other abnormalities of breathing: Secondary | ICD-10-CM

## 2017-08-25 DIAGNOSIS — J9622 Acute and chronic respiratory failure with hypercapnia: Secondary | ICD-10-CM | POA: Diagnosis not present

## 2017-08-25 DIAGNOSIS — Z6841 Body Mass Index (BMI) 40.0 and over, adult: Secondary | ICD-10-CM

## 2017-08-25 DIAGNOSIS — N183 Chronic kidney disease, stage 3 (moderate): Secondary | ICD-10-CM | POA: Diagnosis present

## 2017-08-25 DIAGNOSIS — E871 Hypo-osmolality and hyponatremia: Secondary | ICD-10-CM | POA: Diagnosis present

## 2017-08-25 DIAGNOSIS — E785 Hyperlipidemia, unspecified: Secondary | ICD-10-CM | POA: Diagnosis present

## 2017-08-25 DIAGNOSIS — I96 Gangrene, not elsewhere classified: Secondary | ICD-10-CM | POA: Diagnosis present

## 2017-08-25 DIAGNOSIS — S61402A Unspecified open wound of left hand, initial encounter: Secondary | ICD-10-CM

## 2017-08-25 DIAGNOSIS — E1165 Type 2 diabetes mellitus with hyperglycemia: Secondary | ICD-10-CM | POA: Diagnosis not present

## 2017-08-25 DIAGNOSIS — I872 Venous insufficiency (chronic) (peripheral): Secondary | ICD-10-CM | POA: Diagnosis not present

## 2017-08-25 DIAGNOSIS — A419 Sepsis, unspecified organism: Principal | ICD-10-CM | POA: Diagnosis present

## 2017-08-25 DIAGNOSIS — Z7901 Long term (current) use of anticoagulants: Secondary | ICD-10-CM

## 2017-08-25 DIAGNOSIS — I5032 Chronic diastolic (congestive) heart failure: Secondary | ICD-10-CM | POA: Diagnosis present

## 2017-08-25 DIAGNOSIS — J810 Acute pulmonary edema: Secondary | ICD-10-CM | POA: Diagnosis not present

## 2017-08-25 DIAGNOSIS — Z87891 Personal history of nicotine dependence: Secondary | ICD-10-CM

## 2017-08-25 DIAGNOSIS — I700 Atherosclerosis of aorta: Secondary | ICD-10-CM | POA: Diagnosis present

## 2017-08-25 LAB — CBC WITH DIFFERENTIAL/PLATELET
Basophils Absolute: 0 10*3/uL (ref 0.0–0.1)
Basophils Relative: 0 %
Eosinophils Absolute: 0 10*3/uL (ref 0.0–0.7)
Eosinophils Relative: 0 %
HCT: 40.9 % (ref 36.0–46.0)
Hemoglobin: 13.3 g/dL (ref 12.0–15.0)
Lymphocytes Relative: 2 %
Lymphs Abs: 0.4 10*3/uL — ABNORMAL LOW (ref 0.7–4.0)
MCH: 30.4 pg (ref 26.0–34.0)
MCHC: 32.5 g/dL (ref 30.0–36.0)
MCV: 93.4 fL (ref 78.0–100.0)
Monocytes Absolute: 0.8 10*3/uL (ref 0.1–1.0)
Monocytes Relative: 4 %
Neutro Abs: 17.5 10*3/uL — ABNORMAL HIGH (ref 1.7–7.7)
Neutrophils Relative %: 94 %
Platelets: 130 10*3/uL — ABNORMAL LOW (ref 150–400)
RBC: 4.38 MIL/uL (ref 3.87–5.11)
RDW: 13.8 % (ref 11.5–15.5)
WBC: 18.6 10*3/uL — ABNORMAL HIGH (ref 4.0–10.5)

## 2017-08-25 LAB — BASIC METABOLIC PANEL WITH GFR
Anion gap: 13 (ref 5–15)
BUN: 85 mg/dL — ABNORMAL HIGH (ref 6–20)
CO2: 21 mmol/L — ABNORMAL LOW (ref 22–32)
Calcium: 9.3 mg/dL (ref 8.9–10.3)
Chloride: 93 mmol/L — ABNORMAL LOW (ref 101–111)
Creatinine, Ser: 2.9 mg/dL — ABNORMAL HIGH (ref 0.44–1.00)
GFR calc Af Amer: 18 mL/min — ABNORMAL LOW
GFR calc non Af Amer: 16 mL/min — ABNORMAL LOW
Glucose, Bld: 172 mg/dL — ABNORMAL HIGH (ref 65–99)
Potassium: 5.1 mmol/L (ref 3.5–5.1)
Sodium: 127 mmol/L — ABNORMAL LOW (ref 135–145)

## 2017-08-25 LAB — LIPASE, BLOOD: Lipase: 50 U/L (ref 11–51)

## 2017-08-25 LAB — BRAIN NATRIURETIC PEPTIDE: B Natriuretic Peptide: 396.5 pg/mL — ABNORMAL HIGH (ref 0.0–100.0)

## 2017-08-25 LAB — I-STAT TROPONIN, ED: Troponin i, poc: 1.22 ng/mL (ref 0.00–0.08)

## 2017-08-25 LAB — HEPATIC FUNCTION PANEL
ALK PHOS: 210 U/L — AB (ref 38–126)
ALT: 33 U/L (ref 14–54)
AST: 56 U/L — ABNORMAL HIGH (ref 15–41)
Albumin: 2.8 g/dL — ABNORMAL LOW (ref 3.5–5.0)
BILIRUBIN INDIRECT: 1.1 mg/dL — AB (ref 0.3–0.9)
BILIRUBIN TOTAL: 1.7 mg/dL — AB (ref 0.3–1.2)
Bilirubin, Direct: 0.6 mg/dL — ABNORMAL HIGH (ref 0.1–0.5)
TOTAL PROTEIN: 7.5 g/dL (ref 6.5–8.1)

## 2017-08-25 MED ORDER — HYDROCODONE-ACETAMINOPHEN 5-325 MG PO TABS
1.0000 | ORAL_TABLET | ORAL | Status: DC | PRN
  Administered 2017-08-27 – 2017-08-29 (×2): 1 via ORAL
  Administered 2017-09-02: 2 via ORAL
  Filled 2017-08-25 (×2): qty 1
  Filled 2017-08-25: qty 2
  Filled 2017-08-25: qty 1

## 2017-08-25 MED ORDER — HEPARIN (PORCINE) IN NACL 100-0.45 UNIT/ML-% IJ SOLN
950.0000 [IU]/h | INTRAMUSCULAR | Status: DC
  Administered 2017-08-26: 1000 [IU]/h via INTRAVENOUS
  Administered 2017-08-27: 1300 [IU]/h via INTRAVENOUS
  Administered 2017-08-27: 1500 [IU]/h via INTRAVENOUS
  Administered 2017-08-30: 1200 [IU]/h via INTRAVENOUS
  Administered 2017-08-31: 1100 [IU]/h via INTRAVENOUS
  Administered 2017-09-01 – 2017-09-02 (×2): 950 [IU]/h via INTRAVENOUS
  Filled 2017-08-25 (×11): qty 250

## 2017-08-25 MED ORDER — SODIUM CHLORIDE 0.9 % IV SOLN
INTRAVENOUS | Status: AC
  Administered 2017-08-26 (×2): via INTRAVENOUS

## 2017-08-25 MED ORDER — ADULT MULTIVITAMIN W/MINERALS CH
1.0000 | ORAL_TABLET | Freq: Every day | ORAL | Status: DC
  Administered 2017-08-26 – 2017-09-05 (×9): 1 via ORAL
  Filled 2017-08-25 (×9): qty 1

## 2017-08-25 MED ORDER — NITROGLYCERIN 0.4 MG SL SUBL
0.4000 mg | SUBLINGUAL_TABLET | SUBLINGUAL | Status: DC | PRN
  Filled 2017-08-25: qty 1

## 2017-08-25 MED ORDER — SODIUM CHLORIDE 0.9 % IV BOLUS (SEPSIS)
1000.0000 mL | Freq: Once | INTRAVENOUS | Status: AC
  Administered 2017-08-25: 1000 mL via INTRAVENOUS

## 2017-08-25 MED ORDER — INSULIN GLARGINE 100 UNIT/ML ~~LOC~~ SOLN
15.0000 [IU] | Freq: Every day | SUBCUTANEOUS | Status: DC
  Administered 2017-08-26 (×2): 15 [IU] via SUBCUTANEOUS
  Filled 2017-08-25 (×3): qty 0.15

## 2017-08-25 MED ORDER — ZINC SULFATE 220 (50 ZN) MG PO CAPS
220.0000 mg | ORAL_CAPSULE | Freq: Every day | ORAL | Status: DC
  Administered 2017-08-27 – 2017-09-05 (×8): 220 mg via ORAL
  Filled 2017-08-25 (×11): qty 1

## 2017-08-25 MED ORDER — METOPROLOL SUCCINATE ER 100 MG PO TB24
100.0000 mg | ORAL_TABLET | Freq: Every day | ORAL | Status: DC
Start: 2017-08-26 — End: 2017-08-31
  Administered 2017-08-26 – 2017-08-30 (×5): 100 mg via ORAL
  Filled 2017-08-25 (×6): qty 1

## 2017-08-25 MED ORDER — ACETAMINOPHEN 325 MG PO TABS
650.0000 mg | ORAL_TABLET | Freq: Four times a day (QID) | ORAL | Status: DC | PRN
  Administered 2017-08-26 – 2017-09-05 (×4): 650 mg via ORAL
  Filled 2017-08-25 (×4): qty 2

## 2017-08-25 MED ORDER — DEXTROSE 5 % IV SOLN: 2.0000 g | Freq: Once | INTRAVENOUS | Status: DC

## 2017-08-25 MED ORDER — LEVOTHYROXINE SODIUM 25 MCG PO TABS
137.0000 ug | ORAL_TABLET | Freq: Every day | ORAL | Status: DC
  Administered 2017-08-26 – 2017-08-29 (×4): 137 ug via ORAL
  Filled 2017-08-25 (×6): qty 1

## 2017-08-25 MED ORDER — INSULIN ASPART 100 UNIT/ML ~~LOC~~ SOLN
0.0000 [IU] | SUBCUTANEOUS | Status: DC
  Administered 2017-08-26: 2 [IU] via SUBCUTANEOUS
  Administered 2017-08-26: 3 [IU] via SUBCUTANEOUS
  Filled 2017-08-25 (×2): qty 1

## 2017-08-25 MED ORDER — ONDANSETRON HCL 4 MG/2ML IJ SOLN: 4.0000 mg | Freq: Four times a day (QID) | INTRAMUSCULAR | Status: DC | PRN

## 2017-08-25 MED ORDER — SODIUM CHLORIDE 0.9% FLUSH
3.0000 mL | Freq: Two times a day (BID) | INTRAVENOUS | Status: DC
  Administered 2017-08-26 – 2017-09-05 (×18): 3 mL via INTRAVENOUS

## 2017-08-25 MED ORDER — FERROUS FUMARATE 324 (106 FE) MG PO TABS
1.0000 | ORAL_TABLET | Freq: Two times a day (BID) | ORAL | Status: DC
  Administered 2017-08-26 – 2017-09-04 (×15): 106 mg via ORAL
  Filled 2017-08-25 (×16): qty 1

## 2017-08-25 MED ORDER — ACETAMINOPHEN 650 MG RE SUPP: 650.0000 mg | Freq: Four times a day (QID) | RECTAL | Status: DC | PRN

## 2017-08-25 MED ORDER — DEXTROSE 5 % IV SOLN: 1.0000 g | INTRAVENOUS | Status: DC

## 2017-08-25 MED ORDER — IOPAMIDOL (ISOVUE-370) INJECTION 76%
INTRAVENOUS | Status: AC
  Administered 2017-08-25: 100 mL
  Filled 2017-08-25: qty 100

## 2017-08-25 MED ORDER — ONDANSETRON HCL 4 MG PO TABS: 4.0000 mg | ORAL_TABLET | Freq: Four times a day (QID) | ORAL | Status: DC | PRN

## 2017-08-25 MED ORDER — VITAMIN D 1000 UNITS PO TABS
1000.0000 [IU] | ORAL_TABLET | Freq: Every day | ORAL | Status: DC
  Administered 2017-08-26 – 2017-09-04 (×9): 1000 [IU] via ORAL
  Filled 2017-08-25 (×9): qty 1

## 2017-08-25 MED ORDER — ROSUVASTATIN CALCIUM 10 MG PO TABS
10.0000 mg | ORAL_TABLET | Freq: Every day | ORAL | Status: DC
  Administered 2017-08-26 – 2017-09-05 (×9): 10 mg via ORAL
  Filled 2017-08-25 (×10): qty 1

## 2017-08-25 MED ORDER — SENNOSIDES-DOCUSATE SODIUM 8.6-50 MG PO TABS
1.0000 | ORAL_TABLET | Freq: Every evening | ORAL | Status: DC | PRN
Start: 2017-08-25 — End: 2017-09-05
  Filled 2017-08-25: qty 1

## 2017-08-25 MED ORDER — VITAMIN C 500 MG PO TABS
1000.0000 mg | ORAL_TABLET | Freq: Every day | ORAL | Status: DC
  Administered 2017-08-26 – 2017-09-04 (×9): 1000 mg via ORAL
  Filled 2017-08-25 (×9): qty 2

## 2017-08-25 MED ORDER — DEXTROSE 5 % IV SOLN
1.0000 g | Freq: Once | INTRAVENOUS | Status: AC
  Administered 2017-08-25: 1 g via INTRAVENOUS
  Filled 2017-08-25: qty 10

## 2017-08-25 NOTE — Progress Notes (Signed)
ANTICOAGULATION CONSULT NOTE - Initial Consult  Pharmacy Consult for Heparin (Apixaban on hold) Indication: atrial fibrillation  Allergies  Allergen Reactions  . Glucosamine Shortness Of Breath  . Shellfish-Derived Products Shortness Of Breath  . Codeine Other (See Comments)    Rash, edema  . Atorvastatin Other (See Comments)    Myalgias   . Lisinopril Other (See Comments)    Cough   . Other Other (See Comments)    Crab, chest tightness  . Oxycodone-Aspirin Other (See Comments)    Rash, dreams    Vital Signs: Temp: 100.2 F (37.9 C) (08/30 1836) Temp Source: Oral (08/30 1836) BP: 114/58 (08/30 2215) Pulse Rate: 109 (08/30 2215)  Labs:  Recent Labs  08/25/17 1920  HGB 13.3  HCT 40.9  PLT 130*  CREATININE 2.90*    CrCl cannot be calculated (Unknown ideal weight.).   Medical History: Past Medical History:  Diagnosis Date  . CHF (congestive heart failure) (HCC)   . Diabetes mellitus without complication (HCC)   . Hypertension   . Iron (Fe) deficiency anemia   . Thyroid disease     Assessment: 66 y/o F presents to the ED with flank pain, on apixaban PTA for afib, holding apixaban and starting heparin in the setting of acute renal failure. It has been greater than 12 hours since last apixaban dose>>ok to start heparin now. Will likely need to use aPTT to dose heparin for the next several days due to apixaban influence on anti-Xa levels.   Goal of Therapy:  Heparin level 0.3-0.7 units/ml aPTT 66-102 seconds Monitor platelets by anticoagulation protocol: Yes   Plan:  Start heparin drip at 1000 units/hr 0800 HL/aPTT Daily CBC/HL/aPTT Monitor for bleeding   Abran DukeLedford, Jaliel Deavers 08/25/2017,11:35 PM

## 2017-08-25 NOTE — ED Notes (Addendum)
Patient not hooked up to suction while at scans.  No urine noted in diaper.  Placed on suction and encouraged patient to urinate.  Patient asking for water.  This RN will speak to provider

## 2017-08-25 NOTE — Progress Notes (Signed)
Pharmacy Antibiotic Note  Sylvia Craig is a 66 y.o. female admitted on 08/25/2017 with UTI.  Pharmacy has been consulted for Ceftriaxone dosing. WBC elevated. Pt with AKI.   Plan: -Ceftriaxone 1g IV q24h -F/U urine culture for directed therapy   Temp (24hrs), Avg:100.2 F (37.9 C), Min:100.2 F (37.9 C), Max:100.2 F (37.9 C)   Recent Labs Lab 08/25/17 1920  WBC 18.6*  CREATININE 2.90*    CrCl cannot be calculated (Unknown ideal weight.).    Allergies  Allergen Reactions  . Glucosamine Shortness Of Breath  . Shellfish-Derived Products Shortness Of Breath  . Codeine Other (See Comments)    Rash, edema  . Atorvastatin Other (See Comments)    Myalgias   . Lisinopril Other (See Comments)    Cough   . Other Other (See Comments)    Crab, chest tightness  . Oxycodone-Aspirin Other (See Comments)    Rash, dreams    Sylvia Craig, Sylvia Craig 08/25/2017 11:52 PM

## 2017-08-25 NOTE — ED Notes (Signed)
Patient to radiology.

## 2017-08-25 NOTE — ED Provider Notes (Signed)
[facility]-EMERGENCY DEPT Provider Note   CSN: [id number] Arrival date & time: 08/25/17  1815     History   Chief Complaint Chief Complaint  Patient presents with  . Flank Pain  . Atrial Fibrillation    HPI Sylvia Craig is a 66 y.o. female.  HPI  Patient, with a past medical history of CHF, diabetes, A. Fib on Eliquis, presents to ED for evaluation of left-sided weakness, frequent falls for the past 3 days. She states that she has fallen several times but "no injuries." She reports a left-sided shoulder pain and flank pain. She also reports shortness of breath, chest discomfort. She also reports intermittent headaches. She states that she lives at home with son. She denies any vomiting, abdominal pain, urinary symptoms, bowel changes, vision changes.  Additional history provided by son. He states that "this all began 4 days ago." She was outside walking in from her porch when all of a sudden she felt a feeling that "my legs gave out on me." He states that since then she has been afraid to walk so he has sat her down on the couch. At her baseline she is able to walk with a walker. He states that she has had 2 episodes of "sliding off of the couch" where he has called EMS to times. He states that for the past 2 times however that patient has not been wanting to go to the hospital. He states that she is normally alert and oriented and able to talk about current events. He stated that she wanted him to call EMS today because she was "just not feeling good." Of note, patient is status post treatment/debridement of left hand wound after a fall 2 weeks ago.  Past Medical History:  Diagnosis Date  . CHF (congestive heart failure) (HCC)   . Diabetes mellitus without complication (HCC)   . Hypertension   . Iron (Fe) deficiency anemia   . Thyroid disease     Patient Active Problem List   Diagnosis Date Noted  . Unsteady gait 08/11/2017  . Hypoxemia   . Obstructive sleep apnea   . Necrotic wound  of left hand (HCC)   . SOB (shortness of breath)   . Morbid obesity (HCC) 07/26/2017  . Open wound of hand with complication 07/25/2017  . Cellulitis of hand   . Uncontrolled type 2 diabetes mellitus with ketoacidosis without coma (HCC)   . Chronic diastolic CHF (congestive heart failure) (HCC)   . Cellulitis 07/24/2017    Past Surgical History:  Procedure Laterality Date  . APPLICATION OF A-CELL OF EXTREMITY Left 07/27/2017   Procedure: APPLICATION OF A-CELL;  Surgeon: Peggye Form, DO;  Location: [facility] OR;  Service: Plastics;  Laterality: Left;  . I&D EXTREMITY Left 07/24/2017   Procedure: IRRIGATION AND DEBRIDEMENT EXTREMITY;  Surgeon: Sheral Apley, MD;  Location: [facility] OR;  Service: Orthopedics;  Laterality: Left;  . I&D EXTREMITY Left 07/27/2017   Procedure: IRRIGATION AND DEBRIDEMENT OF LEFT HAND;  Surgeon: Peggye Form, DO;  Location: [facility] OR;  Service: Plastics;  Laterality: Left;    OB History    No data available       Home Medications    Prior to Admission medications   Medication Sig Start Date End Date Taking? Authorizing Provider  acetaminophen (TYLENOL) 500 MG tablet Take 1,000 mg by mouth every 6 (six) hours as needed for moderate pain or headache.     [provider]  apixaban (ELIQUIS) 2.5 MG TABS tablet  Take 2 tablets (5 mg total) by mouth 2 (two) times daily. 07/30/17   Garth Bignessimberlake, Kathryn, MD  cephALEXin (KEFLEX) 500 MG capsule Take 1 capsule (500 mg total) by mouth every 12 (twelve) hours. Patient not taking: Reported on 08/11/2017 07/30/17   Garth Bignessimberlake, Kathryn, MD  Cholecalciferol (VITAMIN D-1000 MAX ST) 1000 units tablet Take 1,000 Units by mouth daily.     [provider]  furosemide (LASIX) 40 MG tablet Take 1 tablet (40 mg total) by mouth daily. 07/30/17   Garth Bignessimberlake, Kathryn, MD  insulin glargine (LANTUS) 100 UNIT/ML injection Inject 18 Units into the skin at bedtime.     [provider]  insulin lispro (HUMALOG KWIKPEN)  100 UNIT/ML KiwkPen Inject 0.04 mLs (4 Units total) into the skin 3 (three) times daily. 07/30/17   Garth Bignessimberlake, Kathryn, MD  levothyroxine (SYNTHROID, LEVOTHROID) 137 MCG tablet Take 137 mcg by mouth daily before breakfast.     [provider]  metoprolol succinate (TOPROL-XL) 100 MG 24 hr tablet Take 1 tablet (100 mg total) by mouth daily. 07/30/17   Garth Bignessimberlake, Kathryn, MD  Multiple Vitamin (MULTIVITAMIN WITH MINERALS) TABS tablet Take 1 tablet by mouth daily. 07/30/17   Garth Bignessimberlake, Kathryn, MD  nitroGLYCERIN (NITROSTAT) 0.4 MG SL tablet Take 0.4 mg under the toungue as needed for chest pain 12/18/14   [provider]  rosuvastatin (CRESTOR) 10 MG tablet Take 10 mg by mouth daily.  06/04/16   [provider]  vitamin C (ASCORBIC ACID) 500 MG tablet Take 1,000 mg by mouth at bedtime.     [provider]  zinc sulfate 220 (50 Zn) MG capsule Take 1 capsule (220 mg total) by mouth daily. 07/30/17   Garth Bignessimberlake, Kathryn, MD    Family History No family history on file.  Social History Social History  Substance Use Topics  . Smoking status: Former Smoker    Packs/day: 1.00    Types: Cigarettes    Quit date: 02/24/1988  . Smokeless tobacco: Never Used  . Alcohol use No     Allergies   Shellfish-derived products; Codeine; Atorvastatin; Lisinopril; Other; and Oxycodone-aspirin   Review of Systems Review of Systems  Constitutional: Negative for appetite change, chills and fever.  HENT: Negative for ear pain, rhinorrhea, sneezing and sore throat.   Eyes: Negative for photophobia and visual disturbance.  Respiratory: Positive for shortness of breath. Negative for cough, chest tightness and wheezing.   Cardiovascular: Positive for chest pain. Negative for palpitations.  Gastrointestinal: Negative for abdominal pain, blood in stool, constipation, diarrhea, nausea and vomiting.  Genitourinary: Positive for flank pain. Negative for dysuria, hematuria and urgency.    Musculoskeletal: Positive for arthralgias and myalgias.  Skin: Negative for rash.  Neurological: Positive for headaches. Negative for dizziness, weakness and light-headedness.     Physical Exam Updated Vital Signs BP 106/82   Pulse (!) 112   Temp 100.2 F (37.9 C) (Oral)   Resp (!) 28   SpO2 99%   Physical Exam  Constitutional: She appears well-developed and well-nourished. No distress.  Chronically ill-appearing female. Nasal cannula in place.  HENT:  Head: Normocephalic and atraumatic.  Nose: Nose normal.  Eyes: Conjunctivae and EOM are normal. Left eye exhibits no discharge. No scleral icterus.  Neck: Normal range of motion. Neck supple.  Cardiovascular: Normal heart sounds and intact distal pulses.  An irregularly irregular rhythm present. Tachycardia present.  Exam reveals no gallop and no friction rub.   No murmur heard. Pulmonary/Chest: Effort normal and breath sounds normal.  No respiratory distress.  Abdominal: Soft. Bowel sounds are normal. She exhibits no distension. There is no tenderness. There is no guarding.  Musculoskeletal: Normal range of motion. She exhibits no edema.  Neurological: She is alert. She exhibits normal muscle tone. Coordination normal.  Patient is alert and oriented to person, place. She is unaware of time or current events. Strength 5/5 in bilateral lower extremities. Grip strength decreased on left side. Sensation intact to light touch in upper and lower extremities. Cranial nerves appear grossly intact.  Skin: Skin is warm and dry. Rash noted.  Several erythematous, weeping lesions in bilateral lower extremities.  Psychiatric: She has a normal mood and affect.  Nursing note and vitals reviewed.    ED Treatments / Results  Labs (all labs ordered are listed, but only abnormal results are displayed) Labs Reviewed  BASIC METABOLIC PANEL - Abnormal; Notable for the following:       Result Value   Sodium 127 (*)    Chloride 93 (*)    CO2 21  (*)    Glucose, Bld 172 (*)    BUN 85 (*)    Creatinine, Ser 2.90 (*)    GFR calc non Af Amer 16 (*)    GFR calc Af Amer 18 (*)    All other components within normal limits  CBC WITH DIFFERENTIAL/PLATELET - Abnormal; Notable for the following:    WBC 18.6 (*)    Platelets 130 (*)    Neutro Abs 17.5 (*)    Lymphs Abs 0.4 (*)    All other components within normal limits  I-STAT TROPONIN, ED - Abnormal; Notable for the following:    Troponin i, poc 1.22 (*)    All other components within normal limits  URINALYSIS, ROUTINE W REFLEX MICROSCOPIC  BRAIN NATRIURETIC PEPTIDE  LIPASE, BLOOD  HEPATIC FUNCTION PANEL    EKG  EKG Interpretation  Date/Time:  Thursday August 25 2017 18:29:49 EDT Ventricular Rate:  127 PR Interval:    QRS Duration: 126 QT Interval:  322 QTC Calculation: 467 R Axis:   47 Text Interpretation:  Atrial fibrillation with rapid ventricular response Non-specific intra-ventricular conduction block Atrial fibrillation Confirmed by Corlis Leak, Courteney ([id number]) on 08/25/2017 7:13:00 PM       Radiology Ct Head Wo Contrast  Result Date: 08/25/2017 CLINICAL DATA:  Altered level of consciousness, history hypertension, diabetes mellitus, CHF EXAM: CT HEAD WITHOUT CONTRAST TECHNIQUE: Contiguous axial images were obtained from the base of the skull through the vertex without intravenous contrast. Sagittal and coronal MPR images reconstructed from axial data set. COMPARISON:  None FINDINGS: Brain: Normal ventricular morphology. No midline shift or mass effect. Normal appearance of brain parenchyma. No intracranial hemorrhage, mass lesion or evidence acute infarction. No extra-axial fluid collections. Vascular: Atherosclerotic calcification of internal carotid and vertebral arteries at skullbase. Skull: Intact Sinuses/Orbits: Clear Other: N/A IMPRESSION: No acute intracranial abnormalities. Electronically Signed   By: Ulyses Southward M.D.   On: 08/25/2017 20:04     Procedures Procedures (including critical care time)  Medications Ordered in ED Medications  iopamidol (ISOVUE-370) 76 % injection (100 mLs  Contrast Given 08/25/17 2005)     Initial Impression / Assessment and Plan / ED Course  I have reviewed the triage vital signs and the nursing notes.  Pertinent labs & imaging results that were available during my care of the patient were reviewed by me and considered in my medical decision making (see chart for details).     Patient, with past medical  history of A. Fib on Eliquis, CHF, diabetes presents to ED for evaluation of left-sided weakness, flank pain and frequent falls for the past 4 days. Additional history was provided by son, who states that patient had 1 episode of "feeling like legs are giving out." She states that she "just doesn't feel well today." On physical exam she is currently in A. Fib with heart rate between 112 to 01/15/2017. She has low-grade fever of 100.2. There are several week history of some bilateral lower extremities with son states are chronic. Initial troponin tap found to be 1.22. EKG showed A. Fib. CBC showed leukocytosis at 18.6. BMP showed sodium of 127, elevation in BUN and creatinine. CT of the head returned as negative. Care signed out to oncoming provider, Glenford Bayley, PA-C pending CTA chest, BMP, lipase, LFTs, UA, CXR. Patient will most likely need to be admitted for elevated troponin, altered mental status.  Final Clinical Impressions(s) / ED Diagnoses   Final diagnoses:  None    New Prescriptions New Prescriptions   No medications on file     Dietrich Pates, Cordelia Poche 08/25/17 2028    Abelino Derrick, MD 08/25/17 2219

## 2017-08-25 NOTE — ED Triage Notes (Signed)
Per EMS, pt from home with c/o left shoulder pain and left flank pain, and weakness. Hx a-fib. EMS vitals: BP-110/80, HR-80-120, RR-16, SpO2-98% 2L [place]

## 2017-08-25 NOTE — ED Notes (Signed)
Patient provided with H20, okay'd by EDP

## 2017-08-25 NOTE — ED Provider Notes (Signed)
Sign out from [facility] K[facility]ri, [place]  Past 3 days, weakness, multiple falls; walking and legs gave out on her 3-4 days ago. Episodes of sliding off the couch. Began having left anterior flank pain today. Altered mental status. Patient reportedly was diagnosed with UTI per PCP and prescribed Cipro. Patient had one dose of this medication.  Hx of A fib, on Eliquis, CHA2DS2-VASc 3  Patient with AKI (BUN 85, creatinine 2.9). Sodium 127, chloride 93. CBC shows WBC 18.6. BNP 396.5. Troponin 1.22. Patient given 1 L fluid bolus and 1 g of Rocephin.  I spoke with cardiology about patient's elevated troponin and he feels that it is due to demand due to patient's clinical picture and lab work. He recommends serial troponins and if there is a significant trending elevation, he would consider heparin, but to hold off until that time.  I consulted [facility], Dr. Antionette Charpyd, who will admit the patient for further evaluation and treatment. Patient also evaluated by Dr. Corlis LeakMackuen who guided the patient's management and agrees with plan.   Emi HolesLaw, Ival Pacer M, PA-C 08/25/17 2359    Abelino DerrickMackuen, Courteney Lyn, MD 09/03/17 [phone]

## 2017-08-25 NOTE — ED Notes (Signed)
Patient continues to be in radiology 

## 2017-08-25 NOTE — ED Notes (Signed)
Pt transported to US

## 2017-08-25 NOTE — ED Notes (Signed)
Copy of troponin given to RN Katie and Dr Corlis LeakMackuen 1.22

## 2017-08-25 NOTE — H&P (Addendum)
History and Physical    Sylvia Craig Craw ZOX:[id number] DOB: 21-Oct-1951 DOA: 08/25/2017  PCP: System, Pcp Not In   Patient coming from: Home  Chief Complaint: Left shoulder ache, weakness, UTI per PCP   HPI: Sylvia Craig Gledhill is a 66 y.o. female with medical history significant for hypertension, hypothyroidism, hyperlipidemia, coronary artery disease, insulin-dependent diabetes mellitus, [name], now presenting to the emergency department for evaluation of left shoulder ache, weakness, and UTI diagnosed by her PCP yesterday. Patient reports that she had been in her usual state of health until approximately 3 days ago she began to develop a generalized weakness. She reports her legs "giving out" multiple times since then, resulting in minor falls. Over the past day or so, she has had a dull ache in the left shoulder, radiating up into the left neck. She denies associated shortness of breath, nausea, or diaphoresis. She is unable to identify any alleviating or exacerbating factors and has been attempting to treat it with Tylenol. She denies cough, dyspnea, melena, hematochezia, nausea, vomiting, or diarrhea.  She endorses dysuria and pain in the left flank, states that she was diagnosed with UTI by her PCP and has taken 1 dose of her prescribed ciprofloxacin.  ED Course: Upon arrival to the ED, patient is found to have a temp of 37.9 C, be saturating well on room air, tachycardic to 127, and with vitals otherwise stable. EKG features atrial fibrillation with RVR, rate 127. Noncontrast head CT is negative for acute intracranial abnormality. Chemistry panel reveals a sodium of 127, BUN 85, and creatinine of 2.90, up from 1.2 earlier this month. CBC is notable for a leukocytosis to 18,600 and a new mild thrombocytopenia with platelets 130,000. Troponin is elevated to a value of 1.22 and BNP is elevated 396. CTA PE study was performed and notable for cardiomegaly with coronary artery disease,  but no PE. Patient was treated with a liter of normal saline and a dose of empiric Rocephin. Lactic acid level and urinalysis have been ordered but not yet collected. Heart rate came down with the IV fluids and blood pressure remains stable. Patient will be admitted to the stepdown unit for ongoing evaluation and management of left shoulder ache with coronary artery disease and elevated troponin, possible sepsis secondary to UTI, and acute kidney injury.  Review of Systems:  All other systems reviewed and apart from HPI, are negative.  Past Medical History:  Diagnosis Date  . CHF (congestive heart failure) (HCC)   . Diabetes mellitus without complication (HCC)   . Hypertension   . Iron (Fe) deficiency anemia   . Thyroid disease     Past Surgical History:  Procedure Laterality Date  . APPLICATION OF A-CELL OF EXTREMITY Left 07/27/2017   Procedure: APPLICATION OF A-CELL;  Surgeon: Peggye Formillingham, Claire S, DO;  Location: [facility] OR;  Service: Plastics;  Laterality: Left;  . I&D EXTREMITY Left 07/24/2017   Procedure: IRRIGATION AND DEBRIDEMENT EXTREMITY;  Surgeon: Sheral ApleyMurphy, Kenwood Rosiak D, MD;  [facility];  Service: Orthopedics;  Laterality: Left;  . I&D EXTREMITY Left 07/27/2017   Procedure: IRRIGATION AND DEBRIDEMENT OF LEFT HAND;  Surgeon: Peggye Formillingham, Claire S, DO;  Location: [facility] OR;  Service: Plastics;  Laterality: Left;     reports that she quit smoking about 29 years ago. Her smoking use included Cigarettes. She smoked 1.00 pack per day. She has never used smokeless tobacco. She reports that she does not drink alcohol or use drugs.  Allergies  Allergen Reactions  .  Glucosamine Shortness Of Breath  . Shellfish-Derived Products Shortness Of Breath  . Codeine Other (See Comments)    Rash, edema  . Atorvastatin Other (See Comments)    Myalgias   . Lisinopril Other (See Comments)    Cough   . Other Other (See Comments)    Crab, chest tightness  . Oxycodone-Aspirin Other (See Comments)    Rash,  dreams    Family History  Problem Relation Age of Onset  . Sudden Cardiac Death Neg Hx      Prior to Admission medications   Medication Sig Start Date End Date Taking? Authorizing Provider  acetaminophen (TYLENOL) 500 MG tablet Take 1,000 mg by mouth every 6 (six) hours as needed for moderate pain or headache.    Yes [provider]  Cholecalciferol (VITAMIN D-1000 MAX ST) 1000 units tablet Take 1,000 Units by mouth at bedtime.    Yes [provider]  ciprofloxacin (CIPRO) 500 MG tablet Take 500 mg by mouth 2 (two) times daily. 08/21/17 08/28/17 Yes [provider]  ELIQUIS 5 MG TABS tablet Take 5 mg by mouth 2 (two) times daily. 07/30/17  Yes [provider]  Ferrous Fumarate (HEMOCYTE - 106 MG FE) 324 (106 Fe) MG TABS tablet Take 1 tablet by mouth 2 (two) times daily.   Yes [provider]  furosemide (LASIX) 40 MG tablet Take 1 tablet (40 mg total) by mouth daily. 07/30/17  Yes Garth Bigness, MD  insulin glargine (LANTUS) 100 UNIT/ML injection Inject 18 Units into the skin at bedtime.    Yes [provider]  insulin lispro (HUMALOG KWIKPEN) 100 UNIT/ML KiwkPen Inject 0.04 mLs (4 Units total) into the skin 3 (three) times daily. 07/30/17  Yes Garth Bigness, MD  levothyroxine (SYNTHROID, LEVOTHROID) 137 MCG tablet Take 137 mcg by mouth daily before breakfast.    Yes [provider]  metoprolol succinate (TOPROL-XL) 100 MG 24 hr tablet Take 1 tablet (100 mg total) by mouth daily. 07/30/17  Yes Garth Bigness, MD  Multiple Vitamin (MULTIVITAMIN WITH MINERALS) TABS tablet Take 1 tablet by mouth daily. 07/30/17  Yes Garth Bigness, MD  nitroGLYCERIN (NITROSTAT) 0.4 MG SL tablet Take 0.4 mg under the toungue as needed for chest pain 12/18/14  Yes [provider]  rosuvastatin (CRESTOR) 10 MG tablet Take 10 mg by mouth at bedtime.  06/04/16  Yes [provider]  vitamin C (ASCORBIC ACID) 500 MG tablet Take  1,000 mg by mouth at bedtime.    Yes [provider]  zinc sulfate 220 (50 Zn) MG capsule Take 1 capsule (220 mg total) by mouth daily. 07/30/17  Yes Garth Bigness, MD  apixaban (ELIQUIS) 2.5 MG TABS tablet Take 2 tablets (5 mg total) by mouth 2 (two) times daily. Patient not taking: Reported on 08/25/2017 07/30/17   Garth Bigness, MD  cephALEXin (KEFLEX) 500 MG capsule Take 1 capsule (500 mg total) by mouth every 12 (twelve) hours. Patient not taking: Reported on 08/11/2017 07/30/17   Garth Bigness, MD    Physical Exam: Vitals:   08/25/17 2300 08/26/17 0030 08/26/17 0045 08/26/17 0315  BP: (!) 116/98 108/67 (!) 106/55 105/72  Pulse: 90 96 99 90  Resp: (!) 21 (!) 22 20 18   Temp:      TempSrc:      SpO2: 100% 97% 100% 100%      Constitutional: NAD, calm, listless, uncomfortable appearing  Eyes: PERTLA, lids and conjunctivae normal ENMT: Mucous membranes are dry. Posterior pharynx clear of any  exudate or lesions.   Neck: normal, supple, no masses, no thyromegaly Respiratory: Slightly diminished bilaterally, no wheezing, no crackles. Normal respiratory effort.    Cardiovascular: Rate ~110 and irregular. 1+ pretibial edema bilaterally. No significant JVD. Abdomen: No distension, no tenderness, no masses palpated. Bowel sounds active. Musculoskeletal: no clubbing / cyanosis. No joint deformity upper and lower extremities.    Skin: Erythema to bilateral lower legs in gaiter distribution with maceration and pustule. Poor turgor. Neurologic: No gross facial asymmetry. Sensation intact. Strength 5/5 in all 4 limbs.  Psychiatric: Alert and oriented x 3. Calm, cooperative.     Labs on Admission: I have personally reviewed following labs and imaging studies  CBC:  Recent Labs Lab 08/25/17 1920 08/26/17 0136  WBC 18.6* 20.4*  NEUTROABS 17.5* 18.1*  HGB 13.3 12.2  HCT 40.9 37.7  MCV 93.4 93.8  PLT 130* 117*   Basic Metabolic Panel:  Recent Labs Lab  08/25/17 1920 08/26/17 0136  NA 127* 129*  K 5.1 4.6  CL 93* 97*  CO2 21* 21*  GLUCOSE 172* 173*  BUN 85* 84*  CREATININE 2.90* 2.86*  CALCIUM 9.3 8.7*   GFR: CrCl cannot be calculated (Unknown ideal weight.). Liver Function Tests:  Recent Labs Lab 08/25/17 1920 08/26/17 0136  AST 56* 39  ALT 33 28  ALKPHOS 210* 169*  BILITOT 1.7* 1.0  PROT 7.5 6.4*  ALBUMIN 2.8* 2.4*    Recent Labs Lab 08/25/17 1920  LIPASE 50   No results for input(s): AMMONIA in the last 168 hours. Coagulation Profile: No results for input(s): INR, PROTIME in the last 168 hours. Cardiac Enzymes:  Recent Labs Lab 08/26/17 0136  TROPONINI 1.08*   BNP (last 3 results) No results for input(s): PROBNP in the last 8760 hours. HbA1C: No results for input(s): HGBA1C in the last 72 hours. CBG:  Recent Labs Lab 08/26/17 0255  GLUCAP 166*   Lipid Profile: No results for input(s): CHOL, HDL, LDLCALC, TRIG, CHOLHDL, LDLDIRECT in the last 72 hours. Thyroid Function Tests: No results for input(s): TSH, T4TOTAL, FREET4, T3FREE, THYROIDAB in the last 72 hours. Anemia Panel: No results for input(s): VITAMINB12, FOLATE, FERRITIN, TIBC, IRON, RETICCTPCT in the last 72 hours. Urine analysis:    Component Value Date/Time   COLORURINE YELLOW 07/24/2017 1752   APPEARANCEUR CLEAR 07/24/2017 1752   LABSPEC 1.012 07/24/2017 1752   PHURINE 6.0 07/24/2017 1752   GLUCOSEU 50 (A) 07/24/2017 1752   HGBUR NEGATIVE 07/24/2017 1752   BILIRUBINUR NEGATIVE 07/24/2017 1752   KETONESUR NEGATIVE 07/24/2017 1752   PROTEINUR 100 (A) 07/24/2017 1752   NITRITE POSITIVE (A) 07/24/2017 1752   LEUKOCYTESUR LARGE (A) 07/24/2017 1752   Sepsis Labs: @LABRCNTIP (procalcitonin:4,lacticidven:4) )No results found for this or any previous visit (from the past 240 hour(s)).   Radiological Exams on Admission: Dg Chest 2 View  Result Date: 08/25/2017 CLINICAL DATA:  Left shoulder pain and flank pain with weakness. EXAM:  CHEST  2 VIEW COMPARISON:  None. FINDINGS: Cardiomegaly with aortic atherosclerosis. The lung volumes are slightly low. No pneumonic consolidation nor overt pulmonary edema. No effusion or pneumothorax. No acute osseous abnormality. Deformity of the left clavicle with healing consistent with old remote fracture. IMPRESSION: Cardiomegaly with aortic atherosclerosis. Slightly low lung volumes. No acute pulmonary disease. Electronically Signed   By: Tollie Eth M.D.   On: 08/25/2017 20:49   Ct Head Wo Contrast  Result Date: 08/25/2017 CLINICAL DATA:  Altered level of consciousness, history hypertension, diabetes mellitus, CHF EXAM: CT HEAD  WITHOUT CONTRAST TECHNIQUE: Contiguous axial images were obtained from the base of the skull through the vertex without intravenous contrast. Sagittal and coronal MPR images reconstructed from axial data set. COMPARISON:  None FINDINGS: Brain: Normal ventricular morphology. No midline shift or mass effect. Normal appearance of brain parenchyma. No intracranial hemorrhage, mass lesion or evidence acute infarction. No extra-axial fluid collections. Vascular: Atherosclerotic calcification of internal carotid and vertebral arteries at skullbase. Skull: Intact Sinuses/Orbits: Clear Other: N/A IMPRESSION: No acute intracranial abnormalities. Electronically Signed   By: Ulyses Southward M.D.   On: 08/25/2017 20:04   Ct Angio Chest Pe W/cm &/or Wo Cm  Result Date: 08/25/2017 CLINICAL DATA:  Postop several weeks ago with elevated D-dimer, dyspnea. EXAM: CT ANGIOGRAPHY CHEST WITH CONTRAST TECHNIQUE: Multidetector CT imaging of the chest was performed using the standard protocol during bolus administration of intravenous contrast. Multiplanar CT image reconstructions and MIPs were obtained to evaluate the vascular anatomy. CONTRAST:  100 cc Isovue 370 IV COMPARISON:  None. FINDINGS: Cardiovascular: No acute pulmonary embolus. Mild dilatation of the main pulmonary artery to 3.2 cm consistent  with a component of chronic pulmonary hypertension. Aortic atherosclerosis without aneurysm. There is cardiomegaly with coronary arteriosclerosis. No pericardial effusion. Reflux of contrast into the hepatic veins consistent with a component of right heart failure or pulmonary hypertension. Mediastinum/Nodes: No enlarged mediastinal, hilar, or axillary lymph nodes. Thyroid gland, trachea, and esophagus demonstrate no significant findings. Lungs/Pleura: Bilateral ground-glass opacities consistent with hypoventilatory change or possibly stigmata of an alveolitis, pneumonitis or pulmonary edema. No pulmonary consolidation. No effusion or pneumothorax. Minimal atelectasis in the lingula and right upper lobe. Upper Abdomen: No acute abnormality. Musculoskeletal: No chest wall abnormality. No acute or significant osseous findings. Review of the MIP images confirms the above findings. IMPRESSION: 1. Cardiomegaly with coronary arteriosclerosis. 2. Aortic atherosclerosis without aneurysm or dissection. 3. No acute pulmonary embolus. Mild dilatation main pulmonary artery consistent with a component of pulmonary hypertension. 4. Nonspecific ground-glass opacities throughout both lungs burning with differential possibilities that may include hypoventilatory change, alveolitis/pneumonitis, atypical infection or stigmata of pulmonary edema among some considerations. Aortic Atherosclerosis (ICD10-I70.0). Electronically Signed   By: Tollie Eth M.D.   On: 08/25/2017 20:47   US Renal  Result Date: 08/26/2017 CLINICAL DATA:  Left flank pain.  Acute renal failure. EXAM: RENAL / URINARY TRACT ULTRASOUND COMPLETE COMPARISON:  None. FINDINGS: Right Kidney: Length: 12.3 cm. Increased parenchymal echogenicity. No hydronephrosis. No calculi. No focal parenchymal lesions. Left Kidney: Length: 11.7 cm. Mildly echogenic parenchyma without focal lesion. No hydronephrosis. Lower pole 9 mm calculus. Bladder: Appears normal for degree of  bladder distention. IMPRESSION: Echogenic renal parenchyma suggest medical renal disease. No hydronephrosis. Lower pole left nephrolithiasis. Electronically Signed   By: Ellery Plunk M.D.   On: 08/26/2017 00:48    EKG: Independently reviewed. Atrial fibrillation with RVR (rate 127).   Assessment/Plan  1. Sepsis ?secondary to UTI  - Pt presents with tachycardia, leukocytosis, AKI, and was diagnosed with UTI by PCP and has taken one dose of Cipro  - Lactic acid is reassuring, was never hypotensive, tachycardia resolved with IVF  - Urine is still pending at this time....  - Urine culture from 1 wk ago grew pan-sensitive E coli  - Lower leg wounds another potential source of infection - Plan to obtain cultures, continue empiric Rocephin   2. Acute kidney injury  - SCr is 2.90 on admission, up from 1.2 earlier this month  - Possibly prerenal given her apparent hypovolemia  -  Check urine studies and renal US  - Hold Lasix while providing gentle NS infusion  - Repeat chem panel in am   3. Elevated troponin, [name]  - Initial troponin elevated to 1.22  - There is evidence for [name] on CTA chest  - She presents with left shoulder ache radiating into left neck concerning for possible angina  - Cardiology consulted by ED physician, doubtful of ACS, recommends trending troponin measurements  - Continue cardiac monitoring, trend troponin, continue beta-blocker, statin, prn NTG    4. Atrial fibrillation with RVR - Rate in 120's on presentation, improved to 90's after 1 liter NS in ED  - CHADS-VASc is 8 (age, gender, CHF, DM, [name]) - Managed with Eliquis and Toprol at home  - Use IV heparin for [facility] given AKI with low GFR - Continue Toprol as tolerated    5. Chronic diastolic CHF - Appears hypovolemic on admission and treated with 1 liter NS in ED  - Plan to continue beta-blocker, continue gentle IVF hydration, follow daily wts and I/O's, resume Lasix as appropriate    6. Insulin-dependent DM   - A1c was 9.8% in July 2018  - Managed at home with Lantus 18 units qHS and Humalog 4 TID  - Check CBG q4h, continue Lantus with 15 units qHS plus a moderate-intensity correctional with Novolog  7. Hyponatremia - Serum sodium is 127 on admission in setting of hypovolemia  - Treated in ED with 1 liter of NS and continue on NS infusion  - Repeat chemistries in am   8. Thrombocytopenia  - Platelets 130k on admission, previously normal  - Likely secondary to infection, no bleeding identified, will monitor   9. Leg wounds  - Bilateral LE's with stigmata of chronic venous stasis  - There are some wounds bilaterally with serous weeping, maceration, surrounding erythema - May be some secondary cellulitis, treating with Rocephin  - Wound care consulted for recs   DVT prophylaxis: Eliquis prior to arrival, converted to IV heparin in light of ARF  Code Status: Full  Family Communication: Discussed with patient Disposition Plan: Admit to SDU Consults called: Cardiology  Admission status: Inpatient    Briscoe Deutscher, MD [facility] Pager [phone]  If 7PM-7AM, please contact night-coverage [email or web address] Password [email or web address]  08/26/2017, 3:35 AM

## 2017-08-26 ENCOUNTER — Encounter (HOSPITAL_COMMUNITY): Payer: Self-pay | Admitting: Family Medicine

## 2017-08-26 DIAGNOSIS — E1110 Type 2 diabetes mellitus with ketoacidosis without coma: Secondary | ICD-10-CM

## 2017-08-26 DIAGNOSIS — G4733 Obstructive sleep apnea (adult) (pediatric): Secondary | ICD-10-CM

## 2017-08-26 DIAGNOSIS — Z794 Long term (current) use of insulin: Secondary | ICD-10-CM

## 2017-08-26 DIAGNOSIS — E871 Hypo-osmolality and hyponatremia: Secondary | ICD-10-CM

## 2017-08-26 DIAGNOSIS — S81809A Unspecified open wound, unspecified lower leg, initial encounter: Secondary | ICD-10-CM

## 2017-08-26 LAB — COMPREHENSIVE METABOLIC PANEL
ALT: 28 U/L (ref 14–54)
AST: 39 U/L (ref 15–41)
Albumin: 2.4 g/dL — ABNORMAL LOW (ref 3.5–5.0)
Alkaline Phosphatase: 169 U/L — ABNORMAL HIGH (ref 38–126)
Anion gap: 11 (ref 5–15)
BILIRUBIN TOTAL: 1 mg/dL (ref 0.3–1.2)
BUN: 84 mg/dL — ABNORMAL HIGH (ref 6–20)
CO2: 21 mmol/L — ABNORMAL LOW (ref 22–32)
CREATININE: 2.86 mg/dL — AB (ref 0.44–1.00)
Calcium: 8.7 mg/dL — ABNORMAL LOW (ref 8.9–10.3)
Chloride: 97 mmol/L — ABNORMAL LOW (ref 101–111)
GFR, EST AFRICAN AMERICAN: 19 mL/min — AB (ref >60)
GFR, EST NON AFRICAN AMERICAN: 16 mL/min — AB (ref >60)
Glucose, Bld: 173 mg/dL — ABNORMAL HIGH (ref 65–99)
Potassium: 4.6 mmol/L (ref 3.5–5.1)
Sodium: 129 mmol/L — ABNORMAL LOW (ref 135–145)
Total Protein: 6.4 g/dL — ABNORMAL LOW (ref 6.5–8.1)

## 2017-08-26 LAB — URINALYSIS, ROUTINE W REFLEX MICROSCOPIC
Bilirubin Urine: NEGATIVE
GLUCOSE, UA: NEGATIVE mg/dL
Ketones, ur: NEGATIVE mg/dL
Nitrite: NEGATIVE
PH: 5 (ref 5.0–8.0)
Protein, ur: 100 mg/dL — AB
SPECIFIC GRAVITY, URINE: 1.023 (ref 1.005–1.030)

## 2017-08-26 LAB — SODIUM, URINE, RANDOM

## 2017-08-26 LAB — TROPONIN I
TROPONIN I: 0.91 ng/mL — AB (ref <0.03)
Troponin I: 1.08 ng/mL (ref <0.03)

## 2017-08-26 LAB — CBC WITH DIFFERENTIAL/PLATELET
Basophils Absolute: 0 10*3/uL (ref 0.0–0.1)
Basophils Relative: 0 %
EOS ABS: 0 10*3/uL (ref 0.0–0.7)
EOS PCT: 0 %
HCT: 37.7 % (ref 36.0–46.0)
Hemoglobin: 12.2 g/dL (ref 12.0–15.0)
LYMPHS ABS: 0.7 10*3/uL (ref 0.7–4.0)
LYMPHS PCT: 3 %
MCH: 30.3 pg (ref 26.0–34.0)
MCHC: 32.4 g/dL (ref 30.0–36.0)
MCV: 93.8 fL (ref 78.0–100.0)
MONO ABS: 1.6 10*3/uL — AB (ref 0.1–1.0)
MONOS PCT: 8 %
Neutro Abs: 18.1 10*3/uL — ABNORMAL HIGH (ref 1.7–7.7)
Neutrophils Relative %: 89 %
Platelets: 117 10*3/uL — ABNORMAL LOW (ref 150–400)
RBC: 4.02 MIL/uL (ref 3.87–5.11)
RDW: 13.8 % (ref 11.5–15.5)
WBC: 20.4 10*3/uL — ABNORMAL HIGH (ref 4.0–10.5)

## 2017-08-26 LAB — CBG MONITORING, ED
Glucose-Capillary: 166 mg/dL — ABNORMAL HIGH (ref 65–99)
Glucose-Capillary: 130 mg/dL — ABNORMAL HIGH (ref 65–99)
Glucose-Capillary: 137 mg/dL — ABNORMAL HIGH (ref 65–99)

## 2017-08-26 LAB — HEPARIN LEVEL (UNFRACTIONATED)

## 2017-08-26 LAB — MRSA PCR SCREENING: MRSA by PCR: NEGATIVE

## 2017-08-26 LAB — GLUCOSE, CAPILLARY
GLUCOSE-CAPILLARY: 138 mg/dL — AB (ref 65–99)
Glucose-Capillary: 99 mg/dL (ref 65–99)

## 2017-08-26 LAB — APTT
APTT: 51 s — AB (ref 24–36)
aPTT: 48 seconds — ABNORMAL HIGH (ref 24–36)

## 2017-08-26 LAB — OSMOLALITY: Osmolality: 305 mOsm/kg — ABNORMAL HIGH (ref 275–295)

## 2017-08-26 LAB — LACTIC ACID, PLASMA: Lactic Acid, Venous: 1.5 mmol/L (ref 0.5–1.9)

## 2017-08-26 LAB — PROTIME-INR
INR: 2.31
Prothrombin Time: 25.2 seconds — ABNORMAL HIGH (ref 11.4–15.2)

## 2017-08-26 LAB — PROCALCITONIN: Procalcitonin: 42.23 ng/mL

## 2017-08-26 LAB — CREATININE, URINE, RANDOM: CREATININE, URINE: 73.14 mg/dL

## 2017-08-26 MED ORDER — PIPERACILLIN-TAZOBACTAM 3.375 G IVPB 30 MIN
3.3750 g | Freq: Once | INTRAVENOUS | Status: AC
  Administered 2017-08-26: 3.375 g via INTRAVENOUS
  Filled 2017-08-26: qty 50

## 2017-08-26 MED ORDER — INSULIN ASPART 100 UNIT/ML ~~LOC~~ SOLN
0.0000 [IU] | Freq: Three times a day (TID) | SUBCUTANEOUS | Status: DC
  Administered 2017-08-26 (×2): 2 [IU] via SUBCUTANEOUS
  Administered 2017-08-27: 1 [IU] via SUBCUTANEOUS
  Administered 2017-08-28: 3 [IU] via SUBCUTANEOUS
  Administered 2017-08-28: 5 [IU] via SUBCUTANEOUS
  Administered 2017-08-29 (×2): 3 [IU] via SUBCUTANEOUS
  Administered 2017-08-29 – 2017-08-30 (×2): 2 [IU] via SUBCUTANEOUS

## 2017-08-26 MED ORDER — ORAL CARE MOUTH RINSE
15.0000 mL | Freq: Two times a day (BID) | OROMUCOSAL | Status: DC
  Administered 2017-08-26 – 2017-09-05 (×18): 15 mL via OROMUCOSAL

## 2017-08-26 MED ORDER — COLLAGENASE 250 UNIT/GM EX OINT
TOPICAL_OINTMENT | Freq: Every day | CUTANEOUS | Status: DC
  Administered 2017-08-27 – 2017-09-05 (×9): via TOPICAL
  Filled 2017-08-26 (×2): qty 30

## 2017-08-26 MED ORDER — PIPERACILLIN-TAZOBACTAM 3.375 G IVPB
3.3750 g | Freq: Three times a day (TID) | INTRAVENOUS | Status: DC
  Administered 2017-08-26 – 2017-08-30 (×12): 3.375 g via INTRAVENOUS
  Filled 2017-08-26 (×13): qty 50

## 2017-08-26 NOTE — Consult Note (Signed)
WOC Nurse wound consult note Reason for Consult:Bilateral lower extremities with erythema, edema and nonintact lesions.  Has never worn compression before. Noted diagnosis of [name].  UTI/ sepsis Wound type: Chronic edema to bilateral lower legs.  States the wounds opened when her hand injury occurred and she could no longer clean them.  Chronic skin changes to bilateral lower legs, erythema and edema present. States they are more swollen than usual Pressure Injury POA: NA Measurement:Left anterior lower leg  Distal :  2 cm x 2 cm x 0.2 cm proximal: 1 cm x 2 cm x 0.1 cm  Right anterior lower leg:  3 cm x 2.2 cm x 0.2 cm  All wounds with 50% slough present Wound bed:50 slough, 50% pale pinnk Drainage (amount, consistency, odor) minimal serosanguinous Periwound:edema, erythema Dressing procedure/placement/frequency:Cleanse wounds to bilateral lower legs with NS and pat dry. Apply Santyl to nonintact wounds.  Cover with NS moist gauze and then dry dressing.  Wrap with kerlix. Change daily  Ace wrap for modified light compression to bilateral lower legs from below toes to below knee to manage edema.  Will not follow at this time.  Please re-consult if needed.  Maple HudsonKaren Havanna Groner RN BSN [facility]ON Pager [phone]

## 2017-08-26 NOTE — ED Notes (Signed)
Check patient's underpants, no output.  Pt denies need to urinate.  Continues to refuse the catheter.  Pt Reminded of wick device between her legs and encouraged to attempt to urinate.

## 2017-08-26 NOTE — ED Notes (Signed)
Attempted report 

## 2017-08-26 NOTE — ED Notes (Signed)
Patient transferred to hospital bed.  

## 2017-08-26 NOTE — ED Notes (Signed)
Patient continues to be in US

## 2017-08-26 NOTE — Progress Notes (Signed)
PROGRESS NOTE   Sylvia Craig  ZOX:[id number]  DOB: March 12, 1951  DOA: 08/25/2017 PCP: System, Pcp Not In  Brief Admission Hx: Sylvia Craig is a 66 y.o. female with medical history significant for hypertension, hypothyroidism, hyperlipidemia, coronary artery disease, insulin-dependent diabetes mellitus, [name], now presenting to the emergency department for evaluation of left shoulder ache, weakness, and UTI diagnosed by her PCP on 8/30.   MDM/Assessment & Plan:   Assessment/Plan  1. Sepsis secondary to UTI  - Pt presented with tachycardia, leukocytosis, AKI, and was diagnosed with UTI by PCP and has taken one dose of Cipro  - Lactic acid is reassuring, was never hypotensive, tachycardia resolved with IVF  - Urine is still pending.  I ordered in/out cath to be done - Urine culture from 1 wk ago grew pan-sensitive E coli  - Zosyn IV ordered per pharmacy  2. Acute kidney injury  - SCr is 2.90 on admission, up from 1.2 earlier this month  - suspect this is prerenal  - Check urine studies and renal US  - Hold Lasix while providing gentle NS infusion  - Repeat chem panel in am   3. Elevated troponin, [name]  - Initial troponin elevated to 1.22  - There is evidence for [name] on CTA chest  - She presents with left shoulder ache radiating into left neck concerning for possible angina  - Cardiology consulted by ED physician, doubtful of ACS, recommends trending troponin measurements  - Continue cardiac monitoring, trend troponin, continue beta-blocker, statin, prn NTG  4. Atrial fibrillation with RVR - Rate in 120's on presentation, improved to 90's after 1 liter NS in ED  - CHADS-VASc is 80 (age, gender, CHF, DM, [name]) - Managed with Eliquis and Toprol at home  - Use IV heparin for [facility] given AKI with low GFR - Continue Toprol    5. Chronic diastolic CHF - Appears hypovolemic on admission and treated with 1 liter NS in ED  - Plan to continue beta-blocker,  continue gentle IVF hydration, follow daily wts and I/O's, resume Lasix as appropriate    6. Insulin-dependent DM  - A1c was 9.8% in July 2018  - Managed at home with Lantus 18 units qHS and Humalog 4 TID  - Monitor CBG closely  7. Hyponatremia - Serum sodium is 127 on admission in setting of hypovolemia  - Treated in ED with 1 liter of NS and continue on NS infusion  - Follow   8. Thrombocytopenia  - Platelets 130k on admission, previously normal  - Likely secondary to infection, no bleeding identified, will monitor   9. Leg wounds  - Bilateral LE's with stigmata of chronic venous stasis  - There are some wounds bilaterally with serous weeping, maceration, surrounding erythema - May be some secondary cellulitis, treating with Rocephin  - Wound care consulted for recs  10. Leukocytosis - WBC trending higher, changed to Zosyn IV per pharmacy.  Follow closely.     DVT prophylaxis: IV heparin Code Status: Full  Family Communication: Discussed with patient Disposition Plan: Admit to SDU Consults called: Cardiology  Admission status: Inpatient   Subjective: Pt says she still has left shoulder pain, leg pain and SOB.  No fever or chills.    Objective: Vitals:   08/26/17 0730 08/26/17 0745 08/26/17 0833 08/26/17 0930  BP: 106/66 (!) 108/53 107/64 124/75  Pulse: 96 100    Resp:  (!) 21 (!) 26 (!) 23  Temp:      TempSrc:  SpO2: 99% 98%     No intake or output data in the 24 hours ending 08/26/17 0944 There were no vitals filed for this visit.   REVIEW OF SYSTEMS  As per history otherwise all reviewed and reported negative  Exam:  General exam: chronically ill appearing, morbidly obese, sitting up in bed, in mild distress.  Cooperative.  Respiratory system: shallow breath sounds bilateral. No increased work of breathing. No rales, rare basilar crackles heard.  Cardiovascular system: S1 & S2 heard irregularly irregular. Gastrointestinal system: Abdomen is  nondistended, soft and nontender. Normal bowel sounds heard. Central nervous system: Alert and oriented. No focal neurological deficits. Extremities: 2+ edema bilateral LEs with chronic venous stasis skin changes and ulcers.   Data Reviewed: Basic Metabolic Panel:  Recent Labs Lab 08/25/17 1920 08/26/17 0136  NA 127* 129*  K 5.1 4.6  CL 93* 97*  CO2 21* 21*  GLUCOSE 172* 173*  BUN 85* 84*  CREATININE 2.90* 2.86*  CALCIUM 9.3 8.7*   Liver Function Tests:  Recent Labs Lab 08/25/17 1920 08/26/17 0136  AST 56* 39  ALT 33 28  ALKPHOS 210* 169*  BILITOT 1.7* 1.0  PROT 7.5 6.4*  ALBUMIN 2.8* 2.4*    Recent Labs Lab 08/25/17 1920  LIPASE 50   No results for input(s): AMMONIA in the last 168 hours. CBC:  Recent Labs Lab 08/25/17 1920 08/26/17 0136  WBC 18.6* 20.4*  NEUTROABS 17.5* 18.1*  HGB 13.3 12.2  HCT 40.9 37.7  MCV 93.4 93.8  PLT 130* 117*   Cardiac Enzymes:  Recent Labs Lab 08/26/17 0136  TROPONINI 1.08*   CBG (last 3)   Recent Labs  08/26/17 0255 08/26/17 0806  GLUCAP 166* 130*   No results found for this or any previous visit (from the past 240 hour(s)).   Studies: Dg Chest 2 View  Result Date: 08/25/2017 CLINICAL DATA:  Left shoulder pain and flank pain with weakness. EXAM: CHEST  2 VIEW COMPARISON:  None. FINDINGS: Cardiomegaly with aortic atherosclerosis. The lung volumes are slightly low. No pneumonic consolidation nor overt pulmonary edema. No effusion or pneumothorax. No acute osseous abnormality. Deformity of the left clavicle with healing consistent with old remote fracture. IMPRESSION: Cardiomegaly with aortic atherosclerosis. Slightly low lung volumes. No acute pulmonary disease. Electronically Signed   By: Tollie Ethavid  Kwon M.D.   On: 08/25/2017 20:49   Ct Head Wo Contrast  Result Date: 08/25/2017 CLINICAL DATA:  Altered level of consciousness, history hypertension, diabetes mellitus, CHF EXAM: CT HEAD WITHOUT CONTRAST TECHNIQUE:  Contiguous axial images were obtained from the base of the skull through the vertex without intravenous contrast. Sagittal and coronal MPR images reconstructed from axial data set. COMPARISON:  None FINDINGS: Brain: Normal ventricular morphology. No midline shift or mass effect. Normal appearance of brain parenchyma. No intracranial hemorrhage, mass lesion or evidence acute infarction. No extra-axial fluid collections. Vascular: Atherosclerotic calcification of internal carotid and vertebral arteries at skullbase. Skull: Intact Sinuses/Orbits: Clear Other: N/A IMPRESSION: No acute intracranial abnormalities. Electronically Signed   By: Ulyses SouthwardMark  Boles M.D.   On: 08/25/2017 20:04   Ct Angio Chest Pe W/cm &/or Wo Cm  Result Date: 08/25/2017 CLINICAL DATA:  Postop several weeks ago with elevated D-dimer, dyspnea. EXAM: CT ANGIOGRAPHY CHEST WITH CONTRAST TECHNIQUE: Multidetector CT imaging of the chest was performed using the standard protocol during bolus administration of intravenous contrast. Multiplanar CT image reconstructions and MIPs were obtained to evaluate the vascular anatomy. CONTRAST:  100 cc Isovue 370 IV  COMPARISON:  None. FINDINGS: Cardiovascular: No acute pulmonary embolus. Mild dilatation of the main pulmonary artery to 3.2 cm consistent with a component of chronic pulmonary hypertension. Aortic atherosclerosis without aneurysm. There is cardiomegaly with coronary arteriosclerosis. No pericardial effusion. Reflux of contrast into the hepatic veins consistent with a component of right heart failure or pulmonary hypertension. Mediastinum/Nodes: No enlarged mediastinal, hilar, or axillary lymph nodes. Thyroid gland, trachea, and esophagus demonstrate no significant findings. Lungs/Pleura: Bilateral ground-glass opacities consistent with hypoventilatory change or possibly stigmata of an alveolitis, pneumonitis or pulmonary edema. No pulmonary consolidation. No effusion or pneumothorax. Minimal atelectasis  in the lingula and right upper lobe. Upper Abdomen: No acute abnormality. Musculoskeletal: No chest wall abnormality. No acute or significant osseous findings. Review of the MIP images confirms the above findings. IMPRESSION: 1. Cardiomegaly with coronary arteriosclerosis. 2. Aortic atherosclerosis without aneurysm or dissection. 3. No acute pulmonary embolus. Mild dilatation main pulmonary artery consistent with a component of pulmonary hypertension. 4. Nonspecific ground-glass opacities throughout both lungs burning with differential possibilities that may include hypoventilatory change, alveolitis/pneumonitis, atypical infection or stigmata of pulmonary edema among some considerations. Aortic Atherosclerosis (ICD10-I70.0). Electronically Signed   By: Tollie Eth M.D.   On: 08/25/2017 20:47   US Renal  Result Date: 08/26/2017 CLINICAL DATA:  Left flank pain.  Acute renal failure. EXAM: RENAL / URINARY TRACT ULTRASOUND COMPLETE COMPARISON:  None. FINDINGS: Right Kidney: Length: 12.3 cm. Increased parenchymal echogenicity. No hydronephrosis. No calculi. No focal parenchymal lesions. Left Kidney: Length: 11.7 cm. Mildly echogenic parenchyma without focal lesion. No hydronephrosis. Lower pole 9 mm calculus. Bladder: Appears normal for degree of bladder distention. IMPRESSION: Echogenic renal parenchyma suggest medical renal disease. No hydronephrosis. Lower pole left nephrolithiasis. Electronically Signed   By: Ellery Plunk M.D.   On: 08/26/2017 00:48   Scheduled Meds: . cholecalciferol  1,000 Units Oral QHS  . Ferrous Fumarate  1 tablet Oral BID WC  . insulin aspart  0-15 Units Subcutaneous Q4H  . insulin glargine  15 Units Subcutaneous QHS  . levothyroxine  137 mcg Oral QAC breakfast  . metoprolol succinate  100 mg Oral Daily  . multivitamin with minerals  1 tablet Oral Daily  . rosuvastatin  10 mg Oral q1800  . sodium chloride flush  3 mL Intravenous Q12H  . vitamin C  1,000 mg Oral QHS  .  zinc sulfate  220 mg Oral Daily   Continuous Infusions: . sodium chloride 100 mL/hr at 08/26/17 0742  . cefTRIAXone (ROCEPHIN)  IV    . heparin 1,000 Units/hr (08/26/17 0742)   Principal Problem:   AKI (acute kidney injury) (HCC) Active Problems:   Uncontrolled type 2 diabetes mellitus with ketoacidosis without coma (HCC)   Chronic diastolic CHF (congestive heart failure) (HCC)   Necrotic wound of left hand (HCC)   Obstructive sleep apnea   Atrial fibrillation with RVR (HCC)   [name] (coronary artery disease)   Elevated troponin   Hyponatremia   UTI (urinary tract infection)   Sepsis secondary to UTI [facility])   Multiple open wounds of lower leg  Time spent:   Standley Dakins, MD, FAAFP [facility] Pager [phone]  If 7PM-7AM, please contact night-coverage [email or web address] Password [email or web address] 08/26/2017, 9:44 AM    LOS: 1 day

## 2017-08-26 NOTE — Progress Notes (Signed)
Pharmacy Antibiotic Note  Sylvia Craig is a 66 y.o. female admitted on 08/25/2017 with pneumonia.  Pharmacy has been consulted for zosyn dosing. Pt with Tmax 100.2 and WBC elevated at 20.4. Scr is elevated at 2.86. Initially started on ceftriaxone and now broadening.   Plan: Zosyn 3.375gm IV Q8H (4 hr inf) F/u renal fxn, C&S, clinical status  Height: 5\' 5"  (165.1 cm) Weight: 264 lb 15.9 oz (120.2 kg) IBW/kg (Calculated) : 57  Temp (24hrs), Avg:100.2 F (37.9 C), Min:100.2 F (37.9 C), Max:100.2 F (37.9 C)   Recent Labs Lab 08/25/17 1920 08/26/17 0135 08/26/17 0136  WBC 18.6*  --  20.4*  CREATININE 2.90*  --  2.86*  LATICACIDVEN  --  1.5  --     Estimated Creatinine Clearance: 24.8 mL/min (A) (by C-G formula based on SCr of 2.86 mg/dL (H)).    Allergies  Allergen Reactions  . Glucosamine Shortness Of Breath  . Shellfish-Derived Products Shortness Of Breath  . Codeine Other (See Comments)    Rash, edema  . Atorvastatin Other (See Comments)    Myalgias   . Lisinopril Other (See Comments)    Cough   . Other Other (See Comments)    Crab, chest tightness  . Oxycodone-Aspirin Other (See Comments)    Rash, dreams    Antimicrobials this admission: CTX x 1 8/30 Zosyn 8/31>>  Dose adjustments this admission: N/A  Microbiology results: Pending  Thank you for allowing pharmacy to be a part of this patient's care.  Race Latour, Drake LeachRachel Lynn 08/26/2017 9:58 AM

## 2017-08-26 NOTE — ED Notes (Signed)
This RN attempted phlebotomy stick x2.  Not able to collect the second set of cultures

## 2017-08-27 LAB — APTT
APTT: 84 s — AB (ref 24–36)
aPTT: 63 seconds — ABNORMAL HIGH (ref 24–36)

## 2017-08-27 LAB — CBC
HEMATOCRIT: 34.4 % — AB (ref 36.0–46.0)
Hemoglobin: 11.3 g/dL — ABNORMAL LOW (ref 12.0–15.0)
MCH: 30.3 pg (ref 26.0–34.0)
MCHC: 32.8 g/dL (ref 30.0–36.0)
MCV: 92.2 fL (ref 78.0–100.0)
Platelets: 121 10*3/uL — ABNORMAL LOW (ref 150–400)
RBC: 3.73 MIL/uL — ABNORMAL LOW (ref 3.87–5.11)
RDW: 14.1 % (ref 11.5–15.5)
WBC: 17.8 10*3/uL — ABNORMAL HIGH (ref 4.0–10.5)

## 2017-08-27 LAB — GLUCOSE, CAPILLARY
GLUCOSE-CAPILLARY: 128 mg/dL — AB (ref 65–99)
GLUCOSE-CAPILLARY: 107 mg/dL — AB (ref 65–99)
Glucose-Capillary: 61 mg/dL — ABNORMAL LOW (ref 65–99)
Glucose-Capillary: 129 mg/dL — ABNORMAL HIGH (ref 65–99)
Glucose-Capillary: 157 mg/dL — ABNORMAL HIGH (ref 65–99)

## 2017-08-27 LAB — RENAL FUNCTION PANEL
ANION GAP: 11 (ref 5–15)
Albumin: 2.1 g/dL — ABNORMAL LOW (ref 3.5–5.0)
BUN: 87 mg/dL — ABNORMAL HIGH (ref 6–20)
CHLORIDE: 100 mmol/L — AB (ref 101–111)
CO2: 22 mmol/L (ref 22–32)
Calcium: 8.7 mg/dL — ABNORMAL LOW (ref 8.9–10.3)
Creatinine, Ser: 2.7 mg/dL — ABNORMAL HIGH (ref 0.44–1.00)
GFR calc Af Amer: 20 mL/min — ABNORMAL LOW (ref >60)
GFR calc non Af Amer: 17 mL/min — ABNORMAL LOW (ref >60)
GLUCOSE: 65 mg/dL (ref 65–99)
POTASSIUM: 4.3 mmol/L (ref 3.5–5.1)
Phosphorus: 4 mg/dL (ref 2.5–4.6)
Sodium: 133 mmol/L — ABNORMAL LOW (ref 135–145)

## 2017-08-27 LAB — URINE CULTURE: Culture: NO GROWTH

## 2017-08-27 LAB — HEPARIN LEVEL (UNFRACTIONATED)

## 2017-08-27 LAB — LIPID PANEL
Cholesterol: 66 mg/dL (ref 0–200)
Triglycerides: 91 mg/dL (ref <150)
VLDL: 18 mg/dL (ref 0–40)

## 2017-08-27 LAB — UREA NITROGEN, URINE: Urea Nitrogen, Ur: 619 mg/dL

## 2017-08-27 LAB — TROPONIN I: TROPONIN I: 0.56 ng/mL — AB (ref <0.03)

## 2017-08-27 MED ORDER — INSULIN GLARGINE 100 UNIT/ML ~~LOC~~ SOLN
10.0000 [IU] | Freq: Every day | SUBCUTANEOUS | Status: DC
  Administered 2017-08-27 – 2017-08-30 (×4): 10 [IU] via SUBCUTANEOUS
  Filled 2017-08-27 (×4): qty 0.1

## 2017-08-27 MED ORDER — GERHARDT'S BUTT CREAM
TOPICAL_CREAM | Freq: Two times a day (BID) | CUTANEOUS | Status: DC
  Administered 2017-08-27 – 2017-09-05 (×18): via TOPICAL
  Filled 2017-08-27 (×2): qty 1

## 2017-08-27 MED ORDER — SODIUM CHLORIDE 0.9 % IV SOLN: INTRAVENOUS | Status: AC

## 2017-08-27 NOTE — Progress Notes (Signed)
PROGRESS NOTE   Sylvia Craig  ZOX:[id number]  DOB: August 15, 1951  DOA: 08/25/2017 PCP: System, Pcp Not In  Brief Admission Hx: Sylvia Craig is a 66 y.o. female with medical history significant for hypertension, hypothyroidism, hyperlipidemia, coronary artery disease, insulin-dependent diabetes mellitus, [name], now presenting to the emergency department for evaluation of left shoulder ache, weakness, and UTI diagnosed by her PCP on 8/30.   MDM/Assessment & Plan:   Assessment/Plan  1. Sepsis secondary to UTI  - Pt presented with tachycardia, leukocytosis, AKI, and was diagnosed with UTI by PCP and has taken one dose of Cipro  - Lactic acid is reassuring, was never hypotensive, tachycardia resolved with IVF  - Urine appears to be infected, waiting for culture data.  - Urine culture from 1 wk ago grew pan-sensitive E coli  - Zosyn IV ordered per pharmacy  2. Acute kidney injury  - SCr is 2.90 on admission, up from 1.2 earlier this month  - suspect this is prerenal Will follow closely. Slowly improving.  - Check urine studies and renal US  - Hold Lasix while providing gentle NS infusion  - Repeat chem panel in am   3. Elevated troponin, [name]  - Initial troponin elevated to 1.22  - There is evidence for [name] on CTA chest  - She presents with left shoulder ache radiating into left neck concerning for possible angina  - Cardiology consulted by ED physician, was doubtful of ACS, recommends trending troponin measurements  - Continue cardiac monitoring, troponin trending down, continue beta-blocker, statin, prn NTG - suspect was demand ischemia   4. Atrial fibrillation with RVR - Rate in 120's on presentation, improved to 90's after 1 liter NS in ED  - CHADS-VASc is 51 (age, gender, CHF, DM, [name]) - Managed with Eliquis and Toprol at home  - Use IV heparin for [facility] given AKI with low GFR - Continue Toprol for rate control.    5. Chronic diastolic CHF - Appears  hypovolemic on admission and treated with 1 liter NS in ED  - Plan to continue beta-blocker, continue gentle IVF hydration, follow daily wts and I/O's, resume Lasix as appropriate    6. Insulin-dependent DM  - A1c was 9.8% in July 2018  - Managed at home with Lantus 18 units qHS and Humalog 4 TID  - Monitor CBG closely  CBG (last 3)   Recent Labs  08/27/17 0736 08/27/17 0910 08/27/17 1126  GLUCAP 61* 129* 128*   7. Hyponatremia - Serum sodium is 127 on admission in setting of hypovolemia  - Treated in ED with 1 liter of NS and continue on NS infusion  - Follow   8. Thrombocytopenia  - Platelets 130k on admission, previously normal  - Likely secondary to infection, no bleeding identified, will monitor   9. Leg wounds  - Bilateral LE's with stigmata of chronic venous stasis  - There are some wounds bilaterally with serous weeping, maceration, surrounding erythema - May be some secondary cellulitis, treating with Rocephin  - Wound care consulted for recs, appreciate recommendations  10. Leukocytosis - WBC slowly trending down, changed to Zosyn IV per pharmacy.  Follow closely.    DVT prophylaxis: IV heparin Code Status: Full  Family Communication: son at bedside Disposition Plan: Admit to SDU Consults called: Cardiology  Admission status: Inpatient   Subjective: Pt says she is slowly starting to feel better. She didn't rest very well.     Objective: Vitals:   08/26/17 2100 08/27/17  0005 08/27/17 0339 08/27/17 1127  BP:  (!) 110/52 (!) 106/48 105/65  Pulse: (!) 107 (!) 101 (!) 104 90  Resp: (!) 24  (!) 23 (!) 21  Temp:  97.7 F (36.5 C) 98 F (36.7 C) 98.9 F (37.2 C)  TempSrc:  Axillary Axillary Oral  SpO2: 95% 93% 97% 96%  Weight:   123.1 kg (271 lb 4.8 oz)   Height:        Intake/Output Summary (Last 24 hours) at 08/27/17 1231 Last data filed at 08/27/17 0700  Gross per 24 hour  Intake          2906.36 ml  Output             1050 ml  Net           1856.36 ml   Filed Weights   08/26/17 0930 08/27/17 0339  Weight: 120.2 kg (264 lb 15.9 oz) 123.1 kg (271 lb 4.8 oz)     REVIEW OF SYSTEMS  As per history otherwise all reviewed and reported negative  Exam:  General exam: chronically ill appearing, morbidly obese, sitting up in bed, in mild distress.  Cooperative.  Respiratory system: shallow breath sounds bilateral. No increased work of breathing. No rales, rare basilar crackles heard.  Cardiovascular system: S1 & S2 heard irregularly irregular. Gastrointestinal system: Abdomen is nondistended, soft and nontender. Normal bowel sounds heard. Central nervous system: Alert and oriented. No focal neurological deficits. Extremities: 2+ edema bilateral LEs with chronic venous stasis skin changes and ulcers.   Data Reviewed: Basic Metabolic Panel:  Recent Labs Lab 08/25/17 1920 08/26/17 0136 08/27/17 0303  NA 127* 129* 133*  K 5.1 4.6 4.3  CL 93* 97* 100*  CO2 21* 21* 22  GLUCOSE 172* 173* 65  BUN 85* 84* 87*  CREATININE 2.90* 2.86* 2.70*  CALCIUM 9.3 8.7* 8.7*  PHOS  --   --  4.0   Liver Function Tests:  Recent Labs Lab 08/25/17 1920 08/26/17 0136 08/27/17 0303  AST 56* 39  --   ALT 33 28  --   ALKPHOS 210* 169*  --   BILITOT 1.7* 1.0  --   PROT 7.5 6.4*  --   ALBUMIN 2.8* 2.4* 2.1*    Recent Labs Lab 08/25/17 1920  LIPASE 50   No results for input(s): AMMONIA in the last 168 hours. CBC:  Recent Labs Lab 08/25/17 1920 08/26/17 0136 08/27/17 0303  WBC 18.6* 20.4* 17.8*  NEUTROABS 17.5* 18.1*  --   HGB 13.3 12.2 11.3*  HCT 40.9 37.7 34.4*  MCV 93.4 93.8 92.2  PLT 130* 117* 121*   Cardiac Enzymes:  Recent Labs Lab 08/26/17 0136 08/26/17 1026 08/27/17 0303  TROPONINI 1.08* 0.91* 0.56*   CBG (last 3)   Recent Labs  08/27/17 0736 08/27/17 0910 08/27/17 1126  GLUCAP 61* 129* 128*   Recent Results (from the past 240 hour(s))  Culture, blood (x 2)     Status: None (Preliminary result)    Collection Time: 08/26/17  1:20 AM  Result Value Ref Range Status   Specimen Description BLOOD LEFT ANTECUBITAL  Final   Special Requests IN PEDIATRIC BOTTLE Blood Culture adequate volume  Final   Culture NO GROWTH 1 DAY  Final   Report Status PENDING  Incomplete  Urine culture     Status: None   Collection Time: 08/26/17 10:52 AM  Result Value Ref Range Status   Specimen Description URINE, RANDOM  Final   Special Requests NONE  Final   Culture NO GROWTH  Final   Report Status 08/27/2017 FINAL  Final  MRSA PCR Screening     Status: None   Collection Time: 08/26/17  2:23 PM  Result Value Ref Range Status   MRSA by PCR NEGATIVE NEGATIVE Final    Comment:        The GeneXpert MRSA Assay (FDA approved for NASAL specimens only), is one component of a comprehensive MRSA colonization surveillance program. It is not intended to diagnose MRSA infection nor to guide or monitor treatment for MRSA infections.     Studies: Dg Chest 2 View  Result Date: 08/25/2017 CLINICAL DATA:  Left shoulder pain and flank pain with weakness. EXAM: CHEST  2 VIEW COMPARISON:  None. FINDINGS: Cardiomegaly with aortic atherosclerosis. The lung volumes are slightly low. No pneumonic consolidation nor overt pulmonary edema. No effusion or pneumothorax. No acute osseous abnormality. Deformity of the left clavicle with healing consistent with old remote fracture. IMPRESSION: Cardiomegaly with aortic atherosclerosis. Slightly low lung volumes. No acute pulmonary disease. Electronically Signed   By: Tollie Eth M.D.   On: 08/25/2017 20:49   Ct Head Wo Contrast  Result Date: 08/25/2017 CLINICAL DATA:  Altered level of consciousness, history hypertension, diabetes mellitus, CHF EXAM: CT HEAD WITHOUT CONTRAST TECHNIQUE: Contiguous axial images were obtained from the base of the skull through the vertex without intravenous contrast. Sagittal and coronal MPR images reconstructed from axial data set. COMPARISON:  None  FINDINGS: Brain: Normal ventricular morphology. No midline shift or mass effect. Normal appearance of brain parenchyma. No intracranial hemorrhage, mass lesion or evidence acute infarction. No extra-axial fluid collections. Vascular: Atherosclerotic calcification of internal carotid and vertebral arteries at skullbase. Skull: Intact Sinuses/Orbits: Clear Other: N/A IMPRESSION: No acute intracranial abnormalities. Electronically Signed   By: Ulyses Southward M.D.   On: 08/25/2017 20:04   Ct Angio Chest Pe W/cm &/or Wo Cm  Result Date: 08/25/2017 CLINICAL DATA:  Postop several weeks ago with elevated D-dimer, dyspnea. EXAM: CT ANGIOGRAPHY CHEST WITH CONTRAST TECHNIQUE: Multidetector CT imaging of the chest was performed using the standard protocol during bolus administration of intravenous contrast. Multiplanar CT image reconstructions and MIPs were obtained to evaluate the vascular anatomy. CONTRAST:  100 cc Isovue 370 IV COMPARISON:  None. FINDINGS: Cardiovascular: No acute pulmonary embolus. Mild dilatation of the main pulmonary artery to 3.2 cm consistent with a component of chronic pulmonary hypertension. Aortic atherosclerosis without aneurysm. There is cardiomegaly with coronary arteriosclerosis. No pericardial effusion. Reflux of contrast into the hepatic veins consistent with a component of right heart failure or pulmonary hypertension. Mediastinum/Nodes: No enlarged mediastinal, hilar, or axillary lymph nodes. Thyroid gland, trachea, and esophagus demonstrate no significant findings. Lungs/Pleura: Bilateral ground-glass opacities consistent with hypoventilatory change or possibly stigmata of an alveolitis, pneumonitis or pulmonary edema. No pulmonary consolidation. No effusion or pneumothorax. Minimal atelectasis in the lingula and right upper lobe. Upper Abdomen: No acute abnormality. Musculoskeletal: No chest wall abnormality. No acute or significant osseous findings. Review of the MIP images confirms the  above findings. IMPRESSION: 1. Cardiomegaly with coronary arteriosclerosis. 2. Aortic atherosclerosis without aneurysm or dissection. 3. No acute pulmonary embolus. Mild dilatation main pulmonary artery consistent with a component of pulmonary hypertension. 4. Nonspecific ground-glass opacities throughout both lungs burning with differential possibilities that may include hypoventilatory change, alveolitis/pneumonitis, atypical infection or stigmata of pulmonary edema among some considerations. Aortic Atherosclerosis (ICD10-I70.0). Electronically Signed   By: Tollie Eth M.D.   On: 08/25/2017 20:47   US  Renal  Result Date: 08/26/2017 CLINICAL DATA:  Left flank pain.  Acute renal failure. EXAM: RENAL / URINARY TRACT ULTRASOUND COMPLETE COMPARISON:  None. FINDINGS: Right Kidney: Length: 12.3 cm. Increased parenchymal echogenicity. No hydronephrosis. No calculi. No focal parenchymal lesions. Left Kidney: Length: 11.7 cm. Mildly echogenic parenchyma without focal lesion. No hydronephrosis. Lower pole 9 mm calculus. Bladder: Appears normal for degree of bladder distention. IMPRESSION: Echogenic renal parenchyma suggest medical renal disease. No hydronephrosis. Lower pole left nephrolithiasis. Electronically Signed   By: Ellery Plunkaniel R Mitchell M.D.   On: 08/26/2017 00:48   Scheduled Meds: . cholecalciferol  1,000 Units Oral QHS  . collagenase   Topical Daily  . Ferrous Fumarate  1 tablet Oral BID WC  . insulin aspart  0-15 Units Subcutaneous TID WC  . insulin glargine  15 Units Subcutaneous QHS  . levothyroxine  137 mcg Oral QAC breakfast  . mouth rinse  15 mL Mouth Rinse BID  . metoprolol succinate  100 mg Oral Daily  . multivitamin with minerals  1 tablet Oral Daily  . rosuvastatin  10 mg Oral q1800  . sodium chloride flush  3 mL Intravenous Q12H  . vitamin C  1,000 mg Oral QHS  . zinc sulfate  220 mg Oral Daily   Continuous Infusions: . heparin 1,500 Units/hr (08/27/17 0435)  . piperacillin-tazobactam  (ZOSYN)  IV 3.375 g (08/27/17 0835)   Principal Problem:   AKI (acute kidney injury) (HCC) Active Problems:   Uncontrolled type 2 diabetes mellitus with ketoacidosis without coma (HCC)   Chronic diastolic CHF (congestive heart failure) (HCC)   Necrotic wound of left hand (HCC)   Obstructive sleep apnea   Atrial fibrillation with RVR (HCC)   [name] (coronary artery disease)   Elevated troponin   Hyponatremia   UTI (urinary tract infection)   Sepsis secondary to UTI [facility](HCC)   Multiple open wounds of lower leg  Time spent:   Standley Dakinslanford Beverly Suriano, MD, FAAFP [facility] Pager [phone]  If 7PM-7AM, please contact night-coverage [email or web address] Password [email or web address] 08/27/2017, 12:31 PM    LOS: 2 days

## 2017-08-27 NOTE — Progress Notes (Signed)
ANTICOAGULATION CONSULT NOTE  Pharmacy Consult for Heparin  Indication: atrial fibrillation  Vital Signs: Temp: 98.9 F (37.2 C) (09/01 1127) Temp Source: Oral (09/01 1127) BP: 105/65 (09/01 1127) Pulse Rate: 90 (09/01 1127)  Labs:  Recent Labs  08/25/17 1920 08/26/17 0136  08/26/17 1026 08/27/17 0303 08/27/17 1317  HGB 13.3 12.2  --   --  11.3*  --   HCT 40.9 37.7  --   --  34.4*  --   PLT 130* 117*  --   --  121*  --   APTT  --   --   < > 51* 63* 84*  LABPROT  --   --   --  25.2*  --   --   INR  --   --   --  2.31  --   --   HEPARINUNFRC  --   --   --  >2.20* >2.20* >2.20*  CREATININE 2.90* 2.86*  --   --  2.70*  --   TROPONINI  --  1.08*  --  0.91* 0.56*  --   < > = values in this interval not displayed.  Estimated Creatinine Clearance: 26.6 mL/min (A) (by C-G formula based on SCr of 2.7 mg/dL (H)).  Assessment: 66 y.o. female with a history of Afib on Eliquis. Currently holding the Eliquis (last dose 8/30) and is on Heparin. Heparin levels are skewed by recent DOAC, so will be dosing based on aPTT's for the time being.  aPTT recheck is therapeutic at 84, so will continue heparin at current rate. CBC stable, no bleeding noted.  Goal of Therapy:  Heparin level 0.3-0.7 units/ml aPTT 66-102 seconds Monitor platelets by anticoagulation protocol: Yes   Plan:  Continue Heparin at 1500 units/hr Daily HL, aPTT, CBC Monitor for s/sx of bleeding   Nolen MuAustin J Lucas PharmD PGY1 Pharmacy Practice Resident 08/27/2017 3:21 PM Pager: [phone]

## 2017-08-27 NOTE — Progress Notes (Signed)
08/27/2017. 3:34 PM  RN requesting flexi-seal saying patient having loose stools and stage 2 skin breakdown right buttocks.

## 2017-08-27 NOTE — Progress Notes (Signed)
RT Note:  Patient states she did not tolerate CPAP last night. Let patient know that if she changes her mind to let her nurse know and she could call me.

## 2017-08-27 NOTE — Progress Notes (Signed)
[facility] notifed that patient troponin 0.56. Patient asymptomatic when initially asked but after phone conversation with [facility] she stated a little chest pain left side. Will continue to monitor. Ilean SkillVeronica Letti Towell LPN

## 2017-08-27 NOTE — Consult Note (Signed)
WOC Nurse wound consult note Reason for Consult:MASD to buttocks.  Bilateral gluteal folds.  Wound type:moisture associated skin damage Pressure Injury POA: NA Wound bed: pink and moist Drainage (amount, consistency, odor) scant serosanguinous weeping.  No odor.  Periwound:blanchable erythema Dressing procedure/placement/frequency:Cleanse buttocks with soap and water and pat dry.  Apply Gerhardt butt cream twice daily.  Keep skin clean and dry.  Will not follow at this time.  Please re-consult if needed.  Maple HudsonKaren Matix Henshaw RN BSN [facility] Pager [phone]

## 2017-08-27 NOTE — Progress Notes (Signed)
ANTICOAGULATION CONSULT NOTE  Pharmacy Consult for Heparin  Indication: atrial fibrillation  Allergies  Allergen Reactions  . Glucosamine Shortness Of Breath  . Shellfish-Derived Products Shortness Of Breath  . Codeine Other (See Comments)    Rash, edema  . Atorvastatin Other (See Comments)    Myalgias   . Lisinopril Other (See Comments)    Cough   . Other Other (See Comments)    Crab, chest tightness  . Oxycodone-Aspirin Other (See Comments)    Rash, dreams    Vital Signs: Temp: 98 F (36.7 C) (09/01 0339) Temp Source: Axillary (09/01 0339) BP: 106/48 (09/01 0339) Pulse Rate: 104 (09/01 0339)  Labs:  Recent Labs  08/25/17 1920 08/26/17 0136 08/26/17 0831 08/26/17 1026 08/27/17 0303  HGB 13.3 12.2  --   --   --   HCT 40.9 37.7  --   --   --   PLT 130* 117*  --   --   --   APTT  --   --  48* 51* 63*  LABPROT  --   --   --  25.2*  --   INR  --   --   --  2.31  --   HEPARINUNFRC  --   --   --  >2.20*  --   CREATININE 2.90* 2.86*  --   --   --   TROPONINI  --  1.08*  --  0.91*  --     Estimated Creatinine Clearance: 25.1 mL/min (A) (by C-G formula based on SCr of 2.86 mg/dL (H)).  Assessment: 66 y.o. female with h/o Afib, Eliquis on hold, for heparin  Goal of Therapy:  Heparin level 0.3-0.7 units/ml aPTT 66-102 seconds Monitor platelets by anticoagulation protocol: Yes   Plan:  Increase Heparin  1500 units/hr  Chord Takahashi, Gary FleetGregory Vernon 08/27/2017,4:31 AM

## 2017-08-28 LAB — CBC
HCT: 34.5 % — ABNORMAL LOW (ref 36.0–46.0)
Hemoglobin: 11.2 g/dL — ABNORMAL LOW (ref 12.0–15.0)
MCH: 30.3 pg (ref 26.0–34.0)
MCHC: 32.5 g/dL (ref 30.0–36.0)
MCV: 93.2 fL (ref 78.0–100.0)
PLATELETS: 122 10*3/uL — AB (ref 150–400)
RBC: 3.7 MIL/uL — ABNORMAL LOW (ref 3.87–5.11)
RDW: 14.5 % (ref 11.5–15.5)
WBC: 16.8 10*3/uL — ABNORMAL HIGH (ref 4.0–10.5)

## 2017-08-28 LAB — GLUCOSE, CAPILLARY
GLUCOSE-CAPILLARY: 172 mg/dL — AB (ref 65–99)
Glucose-Capillary: 116 mg/dL — ABNORMAL HIGH (ref 65–99)
Glucose-Capillary: 105 mg/dL — ABNORMAL HIGH (ref 65–99)
Glucose-Capillary: 211 mg/dL — ABNORMAL HIGH (ref 65–99)
Glucose-Capillary: 169 mg/dL — ABNORMAL HIGH (ref 65–99)

## 2017-08-28 LAB — APTT: APTT: 87 s — AB (ref 24–36)

## 2017-08-28 LAB — RENAL FUNCTION PANEL
Albumin: 2.1 g/dL — ABNORMAL LOW (ref 3.5–5.0)
Anion gap: 10 (ref 5–15)
BUN: 93 mg/dL — AB (ref 6–20)
CHLORIDE: 100 mmol/L — AB (ref 101–111)
CO2: 24 mmol/L (ref 22–32)
CREATININE: 3.19 mg/dL — AB (ref 0.44–1.00)
Calcium: 8.6 mg/dL — ABNORMAL LOW (ref 8.9–10.3)
GFR calc Af Amer: 16 mL/min — ABNORMAL LOW (ref >60)
GFR calc non Af Amer: 14 mL/min — ABNORMAL LOW (ref >60)
Glucose, Bld: 114 mg/dL — ABNORMAL HIGH (ref 65–99)
Phosphorus: 5 mg/dL — ABNORMAL HIGH (ref 2.5–4.6)
Potassium: 4.6 mmol/L (ref 3.5–5.1)
SODIUM: 134 mmol/L — AB (ref 135–145)

## 2017-08-28 LAB — HEPARIN LEVEL (UNFRACTIONATED)

## 2017-08-28 MED ORDER — WARFARIN - PHARMACIST DOSING INPATIENT: Freq: Every day | Status: DC

## 2017-08-28 MED ORDER — FUROSEMIDE 40 MG PO TABS
40.0000 mg | ORAL_TABLET | Freq: Every day | ORAL | Status: DC
  Administered 2017-08-29: 40 mg via ORAL
  Filled 2017-08-28: qty 1

## 2017-08-28 MED ORDER — WARFARIN SODIUM 5 MG PO TABS
5.0000 mg | ORAL_TABLET | Freq: Once | ORAL | Status: AC
  Administered 2017-08-28: 5 mg via ORAL
  Filled 2017-08-28: qty 1

## 2017-08-28 NOTE — Progress Notes (Signed)
Pt refused CPAP at this time. States she had a panic attack with it previously. Currently on 2L.

## 2017-08-28 NOTE — Progress Notes (Signed)
PROGRESS NOTE   Sylvia Craig  BJY:[id number]  DOB: 08-09-1951  DOA: 08/25/2017 PCP: [name], MD  Brief Admission Hx: Sylvia Craig is a 66 y.o. female with medical history significant for hypertension, hypothyroidism, hyperlipidemia, coronary artery disease, insulin-dependent diabetes mellitus, [name], now presenting to the emergency department for evaluation of left shoulder ache, weakness, and UTI diagnosed by her PCP on 8/30.   MDM/Assessment & Plan:   Assessment/Plan  1. Sepsis secondary to UTI  - Pt presented with tachycardia, leukocytosis, AKI, and was diagnosed with UTI by PCP and has taken one dose of Cipro  - Lactic acid is reassuring, was never hypotensive, tachycardia resolved with IVF  - Urine appears to be infected, waiting for culture data.  - Urine culture from 1 wk ago grew pan-sensitive E coli  - Zosyn IV ordered per pharmacy - clinically has been improving, follow culture and sensitivity data  2. Acute kidney injury  - SCr is 2.90 on admission, up from 1.2 earlier this month, now up to 3 - suspect this is prerenal and treating with gentle hydration.  - Check urine studies and renal US showing medical renal disease and no obstruction or hydronephrosis - Hold Lasix while gently hydrating, like could restart tomorrow.   - Repeat chem panel in am, if no improvement consider nephrology consultation.     3. Elevated troponin, [name]  - Initial troponin elevated to 1.22  - There is evidence for [name] on CTA chest  - She presents with left shoulder ache radiating into left neck concerning for possible angina  - Cardiology consulted by ED physician, was doubtful of ACS, recommends trending troponin measurements - Continue cardiac monitoring, troponin trending down, continue beta-blocker, statin, prn NTG - suspect was demand ischemia from afibRVR.   4. Atrial fibrillation with RVR - Rate in 120's on presentation, improved to 90's after 1  liter NS in ED  - CHADS-VASc is 63 (age, gender, CHF, DM, [name]) - Managed with Eliquis and Toprol at home  - Use IV heparin for [facility] given AKI with low GFR, given CKD, will ask pharm to dose warfarin, likely will not be able to resume eliquis - Continue Toprol for rate control.    5. Chronic diastolic CHF - Appears hypovolemic on admission and treated with 1 liter NS in ED  - Plan to continue beta-blocker, continue gentle IVF hydration, follow daily wts and I/O's, resume Lasix as appropriate  6. Insulin-dependent DM  - A1c was 9.8% in July 2018  - Managed at home with Lantus 18 units qHS and Humalog 4 TID  - Monitor CBG closely  CBG (last 3)   Recent Labs  08/28/17 0537 08/28/17 0723 08/28/17 1113  GLUCAP 116* 105* 211*   7. Hyponatremia - Serum sodium is 127 on admission in setting of hypovolemia  - Treated in ED with 1 liter of NS and continue on NS infusion  - Follow   8. Thrombocytopenia  - Platelets 130k on admission, previously normal  - Likely secondary to infection, no bleeding identified, will monitor   9. Leg wounds  - Bilateral LE's with stigmata of chronic venous stasis  - There are some wounds bilaterally with serous weeping, maceration, surrounding erythema - May be some secondary cellulitis, treating with Rocephin  - Wound care consulted for recs, appreciate recommendations  10. Leukocytosis - WBC slowly trending down, changed to Zosyn IV per pharmacy.  Follow closely.    Pt's PCP is Dr. Sydnee Cabal of family  medicine service.  I called and spoke with them and they will take over care for patient in AM.    DVT prophylaxis: IV heparin Code Status: Full  Family Communication: son at bedside Disposition Plan: Admit to SDU Consults called: Cardiology  Admission status: Inpatient   Subjective: Pt says she is slowly starting to feel better. She is eating and drinking better and stools slowing down.      Objective: Vitals:   08/27/17 2300 08/28/17 0351  08/28/17 0725 08/28/17 1115  BP: (!) 107/55 112/73 111/68 106/66  Pulse: 100 97 94 94  Resp: 20 (!) 21 (!) 24 (!) 22  Temp: 97.7 F (36.5 C) 97.9 F (36.6 C) 98.4 F (36.9 C) 98.4 F (36.9 C)  TempSrc: Oral Oral Oral Oral  SpO2: 96% 97% 98% 98%  Weight:  125.4 kg (276 lb 6.4 oz)    Height:        Intake/Output Summary (Last 24 hours) at 08/28/17 1143 Last data filed at 08/28/17 0931  Gross per 24 hour  Intake           1697.5 ml  Output             1000 ml  Net            697.5 ml   Filed Weights   08/26/17 0930 08/27/17 0339 08/28/17 0351  Weight: 120.2 kg (264 lb 15.9 oz) 123.1 kg (271 lb 4.8 oz) 125.4 kg (276 lb 6.4 oz)     REVIEW OF SYSTEMS  As per history otherwise all reviewed and reported negative  Exam:  General exam: chronically ill appearing, morbidly obese, sitting up in bed, in mild distress.  Cooperative.  Respiratory system: shallow breath sounds bilateral. No increased work of breathing. No rales, rare basilar crackles heard.  Cardiovascular system: S1 & S2 heard irregularly irregular. Gastrointestinal system: Abdomen is nondistended, soft and nontender. Normal bowel sounds heard. Central nervous system: Alert and oriented. No focal neurological deficits. Extremities: 2+ edema bilateral LEs with chronic venous stasis skin changes and ulcers.   Data Reviewed: Basic Metabolic Panel:  Recent Labs Lab 08/25/17 1920 08/26/17 0136 08/27/17 0303 08/28/17 0306  NA 127* 129* 133* 134*  K 5.1 4.6 4.3 4.6  CL 93* 97* 100* 100*  CO2 21* 21* 22 24  GLUCOSE 172* 173* 65 114*  BUN 85* 84* 87* 93*  CREATININE 2.90* 2.86* 2.70* 3.19*  CALCIUM 9.3 8.7* 8.7* 8.6*  PHOS  --   --  4.0 5.0*   Liver Function Tests:  Recent Labs Lab 08/25/17 1920 08/26/17 0136 08/27/17 0303 08/28/17 0306  AST 56* 39  --   --   ALT 33 28  --   --   ALKPHOS 210* 169*  --   --   BILITOT 1.7* 1.0  --   --   PROT 7.5 6.4*  --   --   ALBUMIN 2.8* 2.4* 2.1* 2.1*    Recent  Labs Lab 08/25/17 1920  LIPASE 50   No results for input(s): AMMONIA in the last 168 hours. CBC:  Recent Labs Lab 08/25/17 1920 08/26/17 0136 08/27/17 0303 08/28/17 0306  WBC 18.6* 20.4* 17.8* 16.8*  NEUTROABS 17.5* 18.1*  --   --   HGB 13.3 12.2 11.3* 11.2*  HCT 40.9 37.7 34.4* 34.5*  MCV 93.4 93.8 92.2 93.2  PLT 130* 117* 121* 122*   Cardiac Enzymes:  Recent Labs Lab 08/26/17 0136 08/26/17 1026 08/27/17 0303  TROPONINI 1.08* 0.91* 0.56*  CBG (last 3)   Recent Labs  08/28/17 0537 08/28/17 0723 08/28/17 1113  GLUCAP 116* 105* 211*   Recent Results (from the past 240 hour(s))  Culture, blood (x 2)     Status: None (Preliminary result)   Collection Time: 08/26/17  1:20 AM  Result Value Ref Range Status   Specimen Description BLOOD LEFT ANTECUBITAL  Final   Special Requests IN PEDIATRIC BOTTLE Blood Culture adequate volume  Final   Culture NO GROWTH 2 DAYS  Final   Report Status PENDING  Incomplete  Culture, blood (Routine X 2) w Reflex to ID Panel     Status: None (Preliminary result)   Collection Time: 08/26/17  8:14 AM  Result Value Ref Range Status   Specimen Description BLOOD RIGHT HAND  Final   Special Requests IN PEDIATRIC BOTTLE Blood Culture adequate volume  Final   Culture NO GROWTH 1 DAY  Final   Report Status PENDING  Incomplete  Urine culture     Status: None   Collection Time: 08/26/17 10:52 AM  Result Value Ref Range Status   Specimen Description URINE, RANDOM  Final   Special Requests NONE  Final   Culture NO GROWTH  Final   Report Status 08/27/2017 FINAL  Final  MRSA PCR Screening     Status: None   Collection Time: 08/26/17  2:23 PM  Result Value Ref Range Status   MRSA by PCR NEGATIVE NEGATIVE Final    Comment:        The GeneXpert MRSA Assay (FDA approved for NASAL specimens only), is one component of a comprehensive MRSA colonization surveillance program. It is not intended to diagnose MRSA infection nor to guide or monitor  treatment for MRSA infections.     Studies: No results found. Scheduled Meds: . cholecalciferol  1,000 Units Oral QHS  . collagenase   Topical Daily  . Ferrous Fumarate  1 tablet Oral BID WC  . Gerhardt's butt cream   Topical BID  . insulin aspart  0-15 Units Subcutaneous TID WC  . insulin glargine  10 Units Subcutaneous QHS  . levothyroxine  137 mcg Oral QAC breakfast  . mouth rinse  15 mL Mouth Rinse BID  . metoprolol succinate  100 mg Oral Daily  . multivitamin with minerals  1 tablet Oral Daily  . rosuvastatin  10 mg Oral q1800  . sodium chloride flush  3 mL Intravenous Q12H  . vitamin C  1,000 mg Oral QHS  . zinc sulfate  220 mg Oral Daily   Continuous Infusions: . sodium chloride 65 mL/hr at 08/27/17 1330  . heparin 1,500 Units/hr (08/27/17 2230)  . piperacillin-tazobactam (ZOSYN)  IV 3.375 g (08/28/17 16100808)   Principal Problem:   AKI (acute kidney injury) (HCC) Active Problems:   Uncontrolled type 2 diabetes mellitus with ketoacidosis without coma (HCC)   Chronic diastolic CHF (congestive heart failure) (HCC)   Necrotic wound of left hand (HCC)   Obstructive sleep apnea   Atrial fibrillation with RVR (HCC)   [name] (coronary artery disease)   Elevated troponin   Hyponatremia   UTI (urinary tract infection)   Sepsis secondary to UTI [facility](HCC)   Multiple open wounds of lower leg  Time spent:   Standley Dakinslanford Sharad Vaneaton, MD, FAAFP [facility] Pager [phone]  If 7PM-7AM, please contact night-coverage [email or web address] Password [email or web address] 08/28/2017, 11:43 AM    LOS: 3 days

## 2017-08-28 NOTE — Progress Notes (Addendum)
ANTICOAGULATION CONSULT NOTE  Pharmacy Consult for Heparin  Indication: atrial fibrillation  Vital Signs: Temp: 98.4 F (36.9 C) (09/02 0725) Temp Source: Oral (09/02 0725) BP: 111/68 (09/02 0725) Pulse Rate: 94 (09/02 0725)  Labs:  Recent Labs  08/26/17 0136  08/26/17 1026 08/27/17 0303 08/27/17 1317 08/28/17 0306  HGB 12.2  --   --  11.3*  --  11.2*  HCT 37.7  --   --  34.4*  --  34.5*  PLT 117*  --   --  121*  --  122*  APTT  --   < > 51* 63* 84* 87*  LABPROT  --   --  25.2*  --   --   --   INR  --   --  2.31  --   --   --   HEPARINUNFRC  --   < > >2.20* >2.20* >2.20* >2.20*  CREATININE 2.86*  --   --  2.70*  --  3.19*  TROPONINI 1.08*  --  0.91* 0.56*  --   --   < > = values in this interval not displayed.  Estimated Creatinine Clearance: 22.8 mL/min (A) (by C-G formula based on SCr of 3.19 mg/dL (H)).  Assessment: 66 y.o. female with a history of Afib on Eliquis. Currently holding the Eliquis (last dose 8/30) and is on Heparin. Heparin levels are skewed by recent DOAC, so will be dosing based on aPTT's for the time being. It was decided based on her renal function, to start warfarin for long term anticoagulation.   aPTT still therapeutic at 87, so will continue heparin at current rate. CBC stable, no bleeding noted. Will need to bridge the warfarin to a therapeutic INR for two consecutive readings.  Goal of Therapy:  INR 2-3 Heparin level 0.3-0.7 units/ml aPTT 66-102 seconds Monitor platelets by anticoagulation protocol: Yes   Plan:  Continue Heparin at 1500 units/hr Start warfarin at 5mg  Daily HL, aPTT, CBC and INR Monitor for s/sx of bleeding   Nolen MuAustin J Amauris Debois PharmD PGY1 Pharmacy Practice Resident 08/28/2017 8:03 AM Pager: [phone]

## 2017-08-29 ENCOUNTER — Inpatient Hospital Stay (HOSPITAL_COMMUNITY): Payer: Medicare Other

## 2017-08-29 DIAGNOSIS — A419 Sepsis, unspecified organism: Principal | ICD-10-CM

## 2017-08-29 DIAGNOSIS — N390 Urinary tract infection, site not specified: Secondary | ICD-10-CM

## 2017-08-29 DIAGNOSIS — N179 Acute kidney failure, unspecified: Secondary | ICD-10-CM

## 2017-08-29 DIAGNOSIS — I4891 Unspecified atrial fibrillation: Secondary | ICD-10-CM

## 2017-08-29 DIAGNOSIS — J9602 Acute respiratory failure with hypercapnia: Secondary | ICD-10-CM

## 2017-08-29 LAB — BLOOD GAS, ARTERIAL
ACID-BASE DEFICIT: 3 mmol/L — AB (ref 0.0–2.0)
Acid-base deficit: 3.1 mmol/L — ABNORMAL HIGH (ref 0.0–2.0)
BICARBONATE: 24 mmol/L (ref 20.0–28.0)
BICARBONATE: 23.4 mmol/L (ref 20.0–28.0)
DRAWN BY: 358491
Delivery systems: POSITIVE
Drawn by: 244851
EXPIRATORY PAP: 6
FIO2: 30
Inspiratory PAP: 14
O2 Content: 3 L/min
O2 SAT: 96.5 %
O2 Saturation: 96.6 %
PATIENT TEMPERATURE: 98.6
PH ART: 7.194 — AB (ref 7.350–7.450)
PH ART: 7.242 — AB (ref 7.350–7.450)
PO2 ART: 89.3 mmHg (ref 83.0–108.0)
Patient temperature: 98.6
pCO2 arterial: 64.6 mmHg — ABNORMAL HIGH (ref 32.0–48.0)
pCO2 arterial: 56.3 mmHg — ABNORMAL HIGH (ref 32.0–48.0)
pO2, Arterial: 92.8 mmHg (ref 83.0–108.0)

## 2017-08-29 LAB — RENAL FUNCTION PANEL
ANION GAP: 10 (ref 5–15)
Albumin: 2.2 g/dL — ABNORMAL LOW (ref 3.5–5.0)
BUN: 99 mg/dL — ABNORMAL HIGH (ref 6–20)
CALCIUM: 8.8 mg/dL — AB (ref 8.9–10.3)
CHLORIDE: 102 mmol/L (ref 101–111)
CO2: 23 mmol/L (ref 22–32)
Creatinine, Ser: 3.61 mg/dL — ABNORMAL HIGH (ref 0.44–1.00)
GFR calc non Af Amer: 12 mL/min — ABNORMAL LOW (ref >60)
GFR, EST AFRICAN AMERICAN: 14 mL/min — AB (ref >60)
Glucose, Bld: 152 mg/dL — ABNORMAL HIGH (ref 65–99)
Phosphorus: 5.1 mg/dL — ABNORMAL HIGH (ref 2.5–4.6)
Potassium: 4.6 mmol/L (ref 3.5–5.1)
SODIUM: 135 mmol/L (ref 135–145)

## 2017-08-29 LAB — CBC
HEMATOCRIT: 35.8 % — AB (ref 36.0–46.0)
HEMOGLOBIN: 11.4 g/dL — AB (ref 12.0–15.0)
MCH: 30.5 pg (ref 26.0–34.0)
MCHC: 31.8 g/dL (ref 30.0–36.0)
MCV: 95.7 fL (ref 78.0–100.0)
Platelets: 135 10*3/uL — ABNORMAL LOW (ref 150–400)
RBC: 3.74 MIL/uL — ABNORMAL LOW (ref 3.87–5.11)
RDW: 14.7 % (ref 11.5–15.5)
WBC: 20.7 10*3/uL — ABNORMAL HIGH (ref 4.0–10.5)

## 2017-08-29 LAB — PROTIME-INR
INR: 1.77
Prothrombin Time: 20.5 seconds — ABNORMAL HIGH (ref 11.4–15.2)

## 2017-08-29 LAB — GLUCOSE, CAPILLARY
GLUCOSE-CAPILLARY: 160 mg/dL — AB (ref 65–99)
GLUCOSE-CAPILLARY: 136 mg/dL — AB (ref 65–99)
GLUCOSE-CAPILLARY: 163 mg/dL — AB (ref 65–99)
GLUCOSE-CAPILLARY: 198 mg/dL — AB (ref 65–99)
Glucose-Capillary: 133 mg/dL — ABNORMAL HIGH (ref 65–99)
Glucose-Capillary: 148 mg/dL — ABNORMAL HIGH (ref 65–99)

## 2017-08-29 LAB — APTT
APTT: 80 s — AB (ref 24–36)
aPTT: 142 seconds — ABNORMAL HIGH (ref 24–36)
aPTT: 111 seconds — ABNORMAL HIGH (ref 24–36)

## 2017-08-29 LAB — HEPARIN LEVEL (UNFRACTIONATED): Heparin Unfractionated: 2.2 IU/mL — ABNORMAL HIGH (ref 0.30–0.70)

## 2017-08-29 LAB — TROPONIN I: Troponin I: 0.19 ng/mL (ref <0.03)

## 2017-08-29 LAB — LACTIC ACID, PLASMA: LACTIC ACID, VENOUS: 1.1 mmol/L (ref 0.5–1.9)

## 2017-08-29 MED ORDER — WARFARIN SODIUM 5 MG PO TABS: 5.0000 mg | ORAL_TABLET | Freq: Once | ORAL | Status: AC

## 2017-08-29 MED ORDER — VANCOMYCIN HCL 10 G IV SOLR
1750.0000 mg | INTRAVENOUS | Status: DC
  Administered 2017-08-29: 1750 mg via INTRAVENOUS
  Filled 2017-08-29: qty 1750

## 2017-08-29 MED ORDER — SODIUM CHLORIDE 0.9 % IV SOLN: 2500.0000 mg | Freq: Once | INTRAVENOUS | Status: DC

## 2017-08-29 MED ORDER — FUROSEMIDE 10 MG/ML IJ SOLN
80.0000 mg | Freq: Once | INTRAMUSCULAR | Status: AC
Start: 2017-08-29 — End: 2017-08-29
  Administered 2017-08-29: 80 mg via INTRAVENOUS
  Filled 2017-08-29: qty 8

## 2017-08-29 MED ORDER — LEVALBUTEROL HCL 0.63 MG/3ML IN NEBU
0.6300 mg | INHALATION_SOLUTION | Freq: Four times a day (QID) | RESPIRATORY_TRACT | Status: DC | PRN
  Administered 2017-08-29 – 2017-08-30 (×2): 0.63 mg via RESPIRATORY_TRACT
  Filled 2017-08-29 (×2): qty 3

## 2017-08-29 NOTE — Progress Notes (Signed)
Anticoagulation consult note  Pharmacy Consult for Heparin  Indication: atrial fibrillation  Vital Signs: Temp: 97.9 F (36.6 C) (09/03 1932) Temp Source: Axillary (09/03 1932) BP: 138/86 (09/03 2111) Pulse Rate: 85 (09/03 2111)  Labs:  Recent Labs  08/27/17 0303 08/27/17 1317 08/28/17 0306 08/29/17 0138 08/29/17 1030 08/29/17 1057 08/29/17 2107  HGB 11.3*  --  11.2* 11.4*  --   --   --   HCT 34.4*  --  34.5* 35.8*  --   --   --   PLT 121*  --  122* 135*  --   --   --   APTT 63* 84* 87* 142* 111*  --  80*  LABPROT  --   --   --  20.5*  --   --   --   INR  --   --   --  1.77  --   --   --   HEPARINUNFRC >2.20* >2.20* >2.20* 2.20*  --   --   --   CREATININE 2.70*  --  3.19* 3.61*  --   --   --   TROPONINI 0.56*  --   --   --   --  0.19*  --     Estimated Creatinine Clearance: 20.1 mL/min (A) (by C-G formula based on SCr of 3.61 mg/dL (H)).  Assessment: 66 y.o. female with a history of Afib on Eliquis. Currently holding the Eliquis (last dose 8/30) and is on Heparin. Heparin levels are skewed by recent DOAC, so will be dosing based on aPTT's for the time being. It was decided based on her renal function, to start warfarin for long term anticoagulation.  Due to ARF, Eliquis is influencing HL, PT, and INR.   APTT tonight is 80 seconds on heparin drip 1200 units/hr. Therapeutic aPTT.  CBC this AM was stable. No bleeding noted.  Goal of Therapy:  INR 2-3 Heparin level 0.3-0.7 units/ml aPTT 66-102 seconds Monitor platelets by anticoagulation protocol: Yes   Plan:  Continue IV Heparin 1200 units/hr Daily aPTT, HL, CBC and INR Monitor for s/sx of bleeding  Noah Delaineuth Sameeha Rockefeller, RPh Clinical Pharmacist Pager: [phone] 08/29/2017 9:51 PM

## 2017-08-29 NOTE — Progress Notes (Signed)
ANTICOAGULATION CONSULT NOTE  Pharmacy Consult for Heparin  Indication: atrial fibrillation  Allergies  Allergen Reactions  . Glucosamine Shortness Of Breath  . Shellfish-Derived Products Shortness Of Breath  . Codeine Other (See Comments)    Rash, edema  . Atorvastatin Other (See Comments)    Myalgias   . Lisinopril Other (See Comments)    Cough   . Other Other (See Comments)    Crab, chest tightness  . Oxycodone-Aspirin Other (See Comments)    Rash, dreams    Vital Signs: Temp: 98.5 F (36.9 C) (09/02 2346) Temp Source: Oral (09/02 2346) BP: 118/75 (09/02 2346) Pulse Rate: 103 (09/02 2346)  Labs:  Recent Labs  08/26/17 1026  08/27/17 0303 08/27/17 1317 08/28/17 0306 08/29/17 0138  HGB  --   < > 11.3*  --  11.2* 11.4*  HCT  --   --  34.4*  --  34.5* 35.8*  PLT  --   --  121*  --  122* 135*  APTT 51*  --  63* 84* 87* 142*  LABPROT 25.2*  --   --   --   --  20.5*  INR 2.31  --   --   --   --  1.77  HEPARINUNFRC >2.20*  --  >2.20* >2.20* >2.20* 2.20*  CREATININE  --   --  2.70*  --  3.19* 3.61*  TROPONINI 0.91*  --  0.56*  --   --   --   < > = values in this interval not displayed.  Estimated Creatinine Clearance: 20.1 mL/min (A) (by C-G formula based on SCr of 3.61 mg/dL (H)).  Assessment: 66 y.o. female with h/o Afib for heparin/Coumadin.  Due to ARF, Eliquis still influencing both heparin level and PT/INR.    Goal of Therapy:  Heparin level 0.3-0.7 units/ml aPTT 66-102 seconds Monitor platelets by anticoagulation protocol: Yes   Plan:  Decrease Heparin 1300 units/hr APTT in 8 hours Coumadin 5 mg today  Eddie Candlebbott, Kania Regnier Vernon 08/29/2017,3:07 AM

## 2017-08-29 NOTE — Care Management Note (Signed)
Case Management Note  Patient Details  Name: Sylvia Craig MRN: [id number] Date of Birth: [birth year]  Subjective/Objective:   Pt admitted on 08/25/17 with UTI, sepsis, AKI, PNA, and LE wound infection.  PTA, pt states she lives with her brother.                  Action/Plan: Recommend PT/OT consults when able to tolerate therapies.  Will follow for discharge planning needs as pt progresses.   Expected Discharge Date:                  Expected Discharge Plan:  Skilled Nursing Facility  In-House Referral:  Clinical Social Work  Discharge planning Services     Post Acute Care Choice:    Choice offered to:     DME Arranged:    DME Agency:     HH Arranged:    HH Agency:     Status of Service:  In process, will continue to follow  If discussed at Long Length of Stay Meetings, dates discussed:    Additional Comments:  Glennon Macmerson, [name], RN 08/29/2017, 2:45 PM

## 2017-08-29 NOTE — Progress Notes (Signed)
Family Medicine Teaching Service Daily Progress Note Intern Pager: [phone]  Patient name: Sylvia Craig Medical record number: [id number] Date of birth: 1951-02-23 Age: 66 y.o. Gender: female  Primary Care Provider: [name], MD Consultants: Nephrology, Cardiology Code Status: Full  Pt Overview and Major Events to Date:  Increase SOB and WOB placed on CPAP 2L  Assessment and Plan: Sylvia Craig a 66 y.o.femalewith medical history significant forhypertension, hypothyroidism, hyperlipidemia, coronary artery disease, insulin-dependent diabetes mellitus, [name], now presenting to the emergency department for evaluation of left shoulder ache, weakness, and UTI diagnosed by her PCP on 8/30.   Assessment/Plan  #Sepsis secondary to UTI  Pt presented with tachycardia, leukocytosis, AKI, and was diagnosed with UTI by PCP and initially given one dose of ceftraixone (per HPI, though unclear looking at [facility]). Currently patient is Zosyn. WBC is elevated this am at 20.7 up from 16.8 on 9/2. Patient is tachycardic with normal BP and temp. Blood and urine cultures show no growth to date. Urine culture from 1 wk ago grew pan-sensitive E coli. Given worsening WBCs, will consider broadening and antibiotic regimen and further workup.  --Continue Zosyn IV ordered per pharmacy --Will consider broadening antibiotic coverage with Vanc, however given AKI will hold of until further discussion with specialists --Follow up on CBC   #Acute kidney injury  SCr is 2.90 on admission, up from 1.2 earlier this month, now up to 3.61 this am (9/3). Initially, AKI thought to be 2/2 to prerenal but has not improved with IVF. Urine studies and renal US showed medical renal disease and no obstruction or hydronephrosis. On our differential we also have ATN. --Will consult nephrology  --Continue to hold Furosemide --Follow up on am BMP   #Elevated troponin, [name]  On admission troponin  elevated to 1.22. Initial presentation with left shoulder pain radiating to left neck concerning for possible angina. Cards consulted on admission recommended  trending troponin and felt that initial elevation secondary to demand ischemia in the setting of afib with RVR.Troponin have been down trending throughout admission but above normal with last measured trop 0.56. There was evidence of [name] on CTA.  --Consider Cardiology  --Continue cardiac monitoring --Continue metoprolol 100 mg daily --Continue Rosuvastatin 10 mg daily --Continue NTG as needed  #Atrial fibrillation with RVR On admission, rate in 120's , improved to 90's after 1 liter NS in ED. CHADS-VASc is 32 (age, gender, CHF, DM, [name]). Patient is on Eliquis and Toprol at home. On admission patient had an elevated creatinine, started on hep drip while being transition to coumadin for anticoagulation given current renal function. Will reassess treatment with Eliquis. --Order EKG --Continue metoprolol 100 mg daily  #Chronic diastolic CHF Appears hypovolemic on admission and treated with 1 liter NS in ED. Initial. CXR showed cardiomegaly and low lung volume concerning for  fluid retention. No Echo in file. --Will order Echocardiogram --Repeat CXR --Continue metoprolol 100 mg daily  --Daily wts and I/O's,  #T2DM  - A1c was 9.8% in July 2018. Home regimen with Lantus 18 units qHS and Humalog 4 TID.  --Continue Lantus 10u and moderate SSI --continue to monitor CBG   #Hyponatremia, resolved On admittion,  Na+127 in setting of hypovolemia. Treated in ED with 1 liter of NS with continued gentle hydration throughout hospitalization. This am Na+ is 135.  --Follow up on daily BMP  #Thrombocytopenia  On admission, Platelets were 130k  With a previously normal baseline. This admission nadir at 117 on 8/31, has since  gradually improved to 135. No sign of bleeding or clotting. Currently being transition to coumadin with heparin.  --Will  continue to monitor with daily CBC  # Leg wounds and developing buttock ulcer. Bilateral LE's with stigmata of chronic venous stasis.There are some wounds bilaterally with serous weeping, maceration, surrounding erythema. Possibly developing cellulitis. Worsening leukocytosis. Patient is currently only on Zosyn. Patient is afebrile with the rest of her vitals within normal limits. Wound care recommend to cleanse buttocks with soap and water and pat dry.  Apply Gerhardt butt cream twice daily. Keep skin clean and dry.  --Will consider broadening antibiotic regimen with addition of ceftriaxone --Continue wound care     FEN/GI: Carb modified Diet PPx: Heparin drip   Disposition: Likely SNF pending clinical improvement  Subjective: Patient was somnolent, but able to answer basic questions. Per nurse, patient was more alert the past two days.   Objective: Temp:  [97.6 F (36.4 C)-98.5 F (36.9 C)] 97.6 F (36.4 C) (09/03 0727) Pulse Rate:  [94-113] 109 (09/03 0727) Resp:  [20-29] 21 (09/03 0727) BP: (106-118)/(66-75) 118/75 (09/03 0558) SpO2:  [92 %-100 %] 93 % (09/03 0727)   Physical Exam: General: Obese woman, in bed, somnolent but in no acute distress, able to answer basic question and to participate in exam Cardiac: RRR, normal heart sounds, no murmurs. 2+ radial and PT pulses bilaterally Respiratory: Bilateral wheezing noted on exam, no increase work of breathing Abdomen: soft, nontender, nondistended, no hepatic or splenomegaly, +BS Extremities:  Bilateral lower extremities lesions weeping serosanguinous fluid, no purulence noted on exam Skin: warm and dry, no rashes noted Neuro: alert and oriented x4, no focal deficits Psych: Normal affect and mood  Laboratory:  Recent Labs Lab 08/27/17 0303 08/28/17 0306 08/29/17 0138  WBC 17.8* 16.8* 20.7*  HGB 11.3* 11.2* 11.4*  HCT 34.4* 34.5* 35.8*  PLT 121* 122* 135*    Recent Labs Lab 08/25/17 1920 08/26/17 0136  08/27/17 0303 08/28/17 0306 08/29/17 0138  NA 127* 129* 133* 134* 135  K 5.1 4.6 4.3 4.6 4.6  CL 93* 97* 100* 100* 102  CO2 21* 21* 22 24 23   BUN 85* 84* 87* 93* 99*  CREATININE 2.90* 2.86* 2.70* 3.19* 3.61*  CALCIUM 9.3 8.7* 8.7* 8.6* 8.8*  PROT 7.5 6.4*  --   --   --   BILITOT 1.7* 1.0  --   --   --   ALKPHOS 210* 169*  --   --   --   ALT 33 28  --   --   --   AST 56* 39  --   --   --   GLUCOSE 172* 173* 65 114* 152*     Imaging/Diagnostic Tests: Dg Chest Port 1 View  Result Date: 08/29/2017 CLINICAL DATA:  Shortness of breath, CHF, diabetes and hypertension EXAM: PORTABLE CHEST 1 VIEW COMPARISON:  08/25/2017 FINDINGS: Stable cardiomegaly with vascular and interstitial prominence diffusely, suspect mild edema. Minor associated basilar atelectasis. No enlarging effusion or pneumothorax. Trachea is midline. Atherosclerosis of the aorta and degenerative changes of the spine. IMPRESSION: Stable cardiomegaly and mild interstitial edema pattern. Bibasilar atelectasis. Electronically Signed   By: Judie PetitM.  Shick M.D.   On: 08/29/2017 11:06    Lovena Neighboursiallo, Marticia Reifschneider, MD 08/29/2017, 9:56 AM PGY-2, [facility] FPTS Intern pager: [phone], text pages welcome

## 2017-08-29 NOTE — Progress Notes (Signed)
Discussed patient with PCCM Attending Dr. Cathi RoanAlba. Team will consult on patient for possible transfer to ICU if no improvement noted with current treatment plan. Will follow up on PCCM consult, appreciate recommendations.  Thank you   Lovena NeighboursAbdoulaye Cortne Amara, MD [facility], PGY-2

## 2017-08-29 NOTE — Progress Notes (Signed)
Met with pt to discuss discharge planning.  Pt staring up at the ceiling, "guppy breathing".  She states she is having trouble breathing.  Vital signs stable on monitor.  She will answer one or two word answers when asked questions, but seems altered.  Patient's nurse notified; she and the charge nurse immediately came to room to assess patient.    Reinaldo Raddle, RN, BSN  Trauma/Neuro ICU Case Manager [phone]

## 2017-08-29 NOTE — Consult Note (Signed)
HPI: Sylvia Craig is a 66 y.o. female with medical history significant for CKD 3 creat 1.5 on 07/24/17, hypertension, hypothyroidism, hyperlipidemia, coronary artery disease, insulin-dependent diabetes mellitus, [name], now presenting to the emergency department on 8/30  with urosepsis and creat of 2.'9mg'$ /dl. She did receive a CTA with 100cc of Isovue contrast on 08/25/17, also had A fib with RVR to 132 with systolic aprox 440, I & O is pos 4.3 liters and UOP has fallen, and creat has risen to 3.'61mg'$ /dl.  Renal is asked to assist with management. She has become hypercarbic today requiring BiPAP.   Past Medical History:  Diagnosis Date  . CHF (congestive heart failure) ([place])   . Diabetes mellitus without complication ([place])   . Hypertension   . Iron (Fe) deficiency anemia   . Thyroid disease    Past Surgical History:  Procedure Laterality Date  . APPLICATION OF A-CELL OF EXTREMITY Left 07/27/2017   Procedure: APPLICATION OF A-CELL;  Surgeon: Wallace Going, DO;  Location: [place] [facility];  Service: Plastics;  Laterality: Left;  . I&D EXTREMITY Left 07/24/2017   Procedure: IRRIGATION AND DEBRIDEMENT EXTREMITY;  Surgeon: Renette Butters, MD;  Location: [place];  Service: Orthopedics;  Laterality: Left;  . I&D EXTREMITY Left 07/27/2017   Procedure: IRRIGATION AND DEBRIDEMENT OF LEFT HAND;  Surgeon: Wallace Going, DO;  Location: [place];  Service: Plastics;  Laterality: Left;   Social History:  reports that she quit smoking about 29 years ago. Her smoking use included Cigarettes. She smoked 1.00 pack per day. She has never used smokeless tobacco. She reports that she does not drink alcohol or use drugs. Allergies:  Allergies  Allergen Reactions  . Glucosamine Shortness Of Breath  . Shellfish-Derived Products Shortness Of Breath  . Codeine Other (See Comments)    Rash, edema  . Atorvastatin Other (See Comments)    Myalgias   . Lisinopril Other (See Comments)    Cough   .  Other Other (See Comments)    Crab, chest tightness  . Oxycodone-Aspirin Other (See Comments)    Rash, dreams   Family History  Problem Relation Age of Onset  . Sudden Cardiac Death Neg Hx     Medications:  Prior to Admission:  Prescriptions Prior to Admission  Medication Sig Dispense Refill Last Dose  . acetaminophen (TYLENOL) 500 MG tablet Take 1,000 mg by mouth every 6 (six) hours as needed for moderate pain or headache.    08/25/2017 at PRN  . Cholecalciferol (VITAMIN D-1000 MAX ST) 1000 units tablet Take 1,000 Units by mouth at bedtime.    08/24/2017 at Unknown time  . [EXPIRED] ciprofloxacin (CIPRO) 500 MG tablet Take 500 mg by mouth 2 (two) times daily.   08/25/2017 at Unknown time  . ELIQUIS 5 MG TABS tablet Take 5 mg by mouth 2 (two) times daily.  3 08/25/2017 at 0930  . Ferrous Fumarate (HEMOCYTE - 106 MG FE) 324 (106 Fe) MG TABS tablet Take 1 tablet by mouth 2 (two) times daily.   08/25/2017 at Unknown time  . furosemide (LASIX) 40 MG tablet Take 1 tablet (40 mg total) by mouth daily. 30 tablet 3 08/25/2017 at Unknown time  . insulin glargine (LANTUS) 100 UNIT/ML injection Inject 18 Units into the skin at bedtime.    08/24/2017 at Unknown time  . insulin lispro (HUMALOG KWIKPEN) 100 UNIT/ML KiwkPen Inject 0.04 mLs (4 Units total) into the skin 3 (three) times daily. 15 mL 11 08/25/2017 at  Unknown time  . levothyroxine (SYNTHROID, LEVOTHROID) 137 MCG tablet Take 137 mcg by mouth daily before breakfast.    08/25/2017 at 0930  . metoprolol succinate (TOPROL-XL) 100 MG 24 hr tablet Take 1 tablet (100 mg total) by mouth daily. 30 tablet 3 08/25/2017 at 0930  . Multiple Vitamin (MULTIVITAMIN WITH MINERALS) TABS tablet Take 1 tablet by mouth daily. 30 tablet 3 08/24/2017 at Unknown time  . nitroGLYCERIN (NITROSTAT) 0.4 MG SL tablet Take 0.4 mg under the toungue as needed for chest pain   08/24/2017 at Unknown time  . rosuvastatin (CRESTOR) 10 MG tablet Take 10 mg by mouth at bedtime.    08/24/2017  at Unknown time  . vitamin C (ASCORBIC ACID) 500 MG tablet Take 1,000 mg by mouth at bedtime.    08/24/2017 at Unknown time  . zinc sulfate 220 (50 Zn) MG capsule Take 1 capsule (220 mg total) by mouth daily. 30 capsule 3 08/25/2017 at Unknown time  . apixaban (ELIQUIS) 2.5 MG TABS tablet Take 2 tablets (5 mg total) by mouth 2 (two) times daily. (Patient not taking: Reported on 08/25/2017) 60 tablet 3 Not Taking at Unknown time  . cephALEXin (KEFLEX) 500 MG capsule Take 1 capsule (500 mg total) by mouth every 12 (twelve) hours. (Patient not taking: Reported on 08/11/2017) 3 capsule 0 Not Taking   Scheduled: . cholecalciferol  1,000 Units Oral QHS  . collagenase   Topical Daily  . Ferrous Fumarate  1 tablet Oral BID WC  . Gerhardt's butt cream   Topical BID  . insulin aspart  0-15 Units Subcutaneous TID WC  . insulin glargine  10 Units Subcutaneous QHS  . levothyroxine  137 mcg Oral QAC breakfast  . mouth rinse  15 mL Mouth Rinse BID  . metoprolol succinate  100 mg Oral Daily  . multivitamin with minerals  1 tablet Oral Daily  . rosuvastatin  10 mg Oral q1800  . sodium chloride flush  3 mL Intravenous Q12H  . vitamin C  1,000 mg Oral QHS  . warfarin  5 mg Oral ONCE-1800  . Warfarin - Pharmacist Dosing Inpatient   Does not apply q1800  . zinc sulfate  220 mg Oral Daily   Continuous: . heparin 1,200 Units/hr (08/29/17 1602)  . piperacillin-tazobactam (ZOSYN)  IV Stopped (08/29/17 1245)  . vancomycin 1,750 mg (08/29/17 1630)  . [START ON 08/31/2017] vancomycin      ROS: unable to obtain due to mentation  Blood pressure 122/81, pulse (!) 101, temperature 97.8 F (36.6 C), temperature source Axillary, resp. rate 19, height '5\' 5"'$  (1.651 m), weight 125.4 kg (276 lb 6.4 oz), SpO2 98 %.  General appearance: slowed mentation and using BiPAP as well Head: Normocephalic, without obvious abnormality, atraumatic Nose: Nares normal. Septum midline. Mucosa normal. No drainage or sinus  tenderness. Throat: lips, mucosa, and tongue normal; teeth and gums normal Resp: clear to auscultation bilaterally and diff to auscultate Chest wall: no tenderness Cardio: regular rate and rhythm, S1, S2 normal, no murmur, click, rub or gallop GI: obese Extremities: edema 2-3+ bil Skin: clear Neuro: lethargic  Results for orders placed or performed during the hospital encounter of 08/25/17 (from the past 48 hour(s))  Glucose, capillary     Status: Abnormal   Collection Time: 08/27/17  9:20 PM  Result Value Ref Range   Glucose-Capillary 157 (H) 65 - 99 mg/dL  Heparin level (unfractionated)     Status: Abnormal   Collection Time: 08/28/17  3:06 AM  Result Value Ref Range   Heparin Unfractionated >2.20 (H) 0.30 - 0.70 IU/mL    Comment: RESULTS CONFIRMED BY MANUAL DILUTION        IF HEPARIN RESULTS ARE BELOW EXPECTED VALUES, AND PATIENT DOSAGE HAS BEEN CONFIRMED, SUGGEST FOLLOW UP TESTING OF ANTITHROMBIN III LEVELS.   CBC     Status: Abnormal   Collection Time: 08/28/17  3:06 AM  Result Value Ref Range   WBC 16.8 (H) 4.0 - 10.5 K/uL   RBC 3.70 (L) 3.87 - 5.11 MIL/uL   Hemoglobin 11.2 (L) 12.0 - 15.0 g/dL   HCT 78.6 (L) 76.7 - 20.9 %   MCV 93.2 78.0 - 100.0 fL   MCH 30.3 26.0 - 34.0 pg   MCHC 32.5 30.0 - 36.0 g/dL   RDW 47.0 96.2 - 83.6 %   Platelets 122 (L) 150 - 400 K/uL  APTT     Status: Abnormal   Collection Time: 08/28/17  3:06 AM  Result Value Ref Range   aPTT 87 (H) 24 - 36 seconds    Comment:        IF BASELINE aPTT IS ELEVATED, SUGGEST PATIENT RISK ASSESSMENT BE USED TO DETERMINE APPROPRIATE ANTICOAGULANT THERAPY.   Renal function panel     Status: Abnormal   Collection Time: 08/28/17  3:06 AM  Result Value Ref Range   Sodium 134 (L) 135 - 145 mmol/L   Potassium 4.6 3.5 - 5.1 mmol/L   Chloride 100 (L) 101 - 111 mmol/L   CO2 24 22 - 32 mmol/L   Glucose, Bld 114 (H) 65 - 99 mg/dL   BUN 93 (H) 6 - 20 mg/dL   Creatinine, Ser 6.29 (H) 0.44 - 1.00 mg/dL    Calcium 8.6 (L) 8.9 - 10.3 mg/dL   Phosphorus 5.0 (H) 2.5 - 4.6 mg/dL   Albumin 2.1 (L) 3.5 - 5.0 g/dL   GFR calc non Af Amer 14 (L) >60 mL/min   GFR calc Af Amer 16 (L) >60 mL/min    Comment: (NOTE) The eGFR has been calculated using the CKD EPI equation. This calculation has not been validated in all clinical situations. eGFR's persistently <60 mL/min signify possible Chronic Kidney Disease.    Anion gap 10 5 - 15  Glucose, capillary     Status: Abnormal   Collection Time: 08/28/17  5:37 AM  Result Value Ref Range   Glucose-Capillary 116 (H) 65 - 99 mg/dL  Glucose, capillary     Status: Abnormal   Collection Time: 08/28/17  7:23 AM  Result Value Ref Range   Glucose-Capillary 105 (H) 65 - 99 mg/dL  Glucose, capillary     Status: Abnormal   Collection Time: 08/28/17 11:13 AM  Result Value Ref Range   Glucose-Capillary 211 (H) 65 - 99 mg/dL  Glucose, capillary     Status: Abnormal   Collection Time: 08/28/17  4:44 PM  Result Value Ref Range   Glucose-Capillary 172 (H) 65 - 99 mg/dL  Glucose, capillary     Status: Abnormal   Collection Time: 08/28/17  9:27 PM  Result Value Ref Range   Glucose-Capillary 169 (H) 65 - 99 mg/dL  Heparin level (unfractionated)     Status: Abnormal   Collection Time: 08/29/17  1:38 AM  Result Value Ref Range   Heparin Unfractionated 2.20 (H) 0.30 - 0.70 IU/mL    Comment: RESULTS CONFIRMED BY MANUAL DILUTION        IF HEPARIN RESULTS ARE BELOW EXPECTED VALUES, AND PATIENT DOSAGE HAS  BEEN CONFIRMED, SUGGEST FOLLOW UP TESTING OF ANTITHROMBIN III LEVELS.   CBC     Status: Abnormal   Collection Time: 08/29/17  1:38 AM  Result Value Ref Range   WBC 20.7 (H) 4.0 - 10.5 K/uL   RBC 3.74 (L) 3.87 - 5.11 MIL/uL   Hemoglobin 11.4 (L) 12.0 - 15.0 g/dL   HCT 35.8 (L) 36.0 - 46.0 %   MCV 95.7 78.0 - 100.0 fL   MCH 30.5 26.0 - 34.0 pg   MCHC 31.8 30.0 - 36.0 g/dL   RDW 14.7 11.5 - 15.5 %   Platelets 135 (L) 150 - 400 K/uL  APTT     Status: Abnormal    Collection Time: 08/29/17  1:38 AM  Result Value Ref Range   aPTT 142 (H) 24 - 36 seconds    Comment:        IF BASELINE aPTT IS ELEVATED, SUGGEST PATIENT RISK ASSESSMENT BE USED TO DETERMINE APPROPRIATE ANTICOAGULANT THERAPY.   Renal function panel     Status: Abnormal   Collection Time: 08/29/17  1:38 AM  Result Value Ref Range   Sodium 135 135 - 145 mmol/L   Potassium 4.6 3.5 - 5.1 mmol/L   Chloride 102 101 - 111 mmol/L   CO2 23 22 - 32 mmol/L   Glucose, Bld 152 (H) 65 - 99 mg/dL   BUN 99 (H) 6 - 20 mg/dL   Creatinine, Ser 3.61 (H) 0.44 - 1.00 mg/dL   Calcium 8.8 (L) 8.9 - 10.3 mg/dL   Phosphorus 5.1 (H) 2.5 - 4.6 mg/dL   Albumin 2.2 (L) 3.5 - 5.0 g/dL   GFR calc non Af Amer 12 (L) >60 mL/min   GFR calc Af Amer 14 (L) >60 mL/min    Comment: (NOTE) The eGFR has been calculated using the CKD EPI equation. This calculation has not been validated in all clinical situations. eGFR's persistently <60 mL/min signify possible Chronic Kidney Disease.    Anion gap 10 5 - 15  Protime-INR     Status: Abnormal   Collection Time: 08/29/17  1:38 AM  Result Value Ref Range   Prothrombin Time 20.5 (H) 11.4 - 15.2 seconds   INR 1.77   Glucose, capillary     Status: Abnormal   Collection Time: 08/29/17  2:48 AM  Result Value Ref Range   Glucose-Capillary 160 (H) 65 - 99 mg/dL  Glucose, capillary     Status: Abnormal   Collection Time: 08/29/17  6:54 AM  Result Value Ref Range   Glucose-Capillary 133 (H) 65 - 99 mg/dL  Glucose, capillary     Status: Abnormal   Collection Time: 08/29/17  7:18 AM  Result Value Ref Range   Glucose-Capillary 136 (H) 65 - 99 mg/dL  APTT     Status: Abnormal   Collection Time: 08/29/17 10:30 AM  Result Value Ref Range   aPTT 111 (H) 24 - 36 seconds    Comment:        IF BASELINE aPTT IS ELEVATED, SUGGEST PATIENT RISK ASSESSMENT BE USED TO DETERMINE APPROPRIATE ANTICOAGULANT THERAPY.   Troponin I     Status: Abnormal   Collection Time: 08/29/17  10:57 AM  Result Value Ref Range   Troponin I 0.19 (HH) <0.03 ng/mL    Comment: CRITICAL RESULT CALLED TO, READ BACK BY AND VERIFIED WITH: V.MENDEZ,RN 08/29/17 1202 BY BSLADE   Glucose, capillary     Status: Abnormal   Collection Time: 08/29/17 11:17 AM  Result Value Ref Range  Glucose-Capillary 163 (H) 65 - 99 mg/dL  Blood gas, arterial     Status: Abnormal   Collection Time: 08/29/17 12:36 PM  Result Value Ref Range   O2 Content 3.0 L/min   Delivery systems NASAL CANNULA    pH, Arterial 7.194 (LL) 7.350 - 7.450    Comment: CRITICAL RESULT CALLED TO, READ BACK BY AND VERIFIED WITH: V.MENDEZ RN AT 1246 [place] L.DOYLE RRT ON 08/29/17    pCO2 arterial 64.6 (H) 32.0 - 48.0 mmHg   pO2, Arterial 92.8 83.0 - 108.0 mmHg   Bicarbonate 24.0 20.0 - 28.0 mmol/L   Acid-base deficit 3.1 (H) 0.0 - 2.0 mmol/L   O2 Saturation 96.6 %   Patient temperature 98.6    Collection site RIGHT RADIAL    Drawn by [id number]    Sample type ARTERIAL DRAW    Allens test (pass/fail) PASS PASS  Glucose, capillary     Status: Abnormal   Collection Time: 08/29/17  4:21 PM  Result Value Ref Range   Glucose-Capillary 198 (H) 65 - 99 mg/dL   Dg Chest Port 1 View  Result Date: 08/29/2017 CLINICAL DATA:  Shortness of breath, CHF, diabetes and hypertension EXAM: PORTABLE CHEST 1 VIEW COMPARISON:  08/25/2017 FINDINGS: Stable cardiomegaly with vascular and interstitial prominence diffusely, suspect mild edema. Minor associated basilar atelectasis. No enlarging effusion or pneumothorax. Trachea is midline. Atherosclerosis of the aorta and degenerative changes of the spine. IMPRESSION: Stable cardiomegaly and mild interstitial edema pattern. Bibasilar atelectasis. Electronically Signed   By: Jerilynn Mages.  Shick M.D.   On: 08/29/2017 11:06    Assessment:  1 CKD 3 2 AKI, hemodynamically mediated due to sepsis, IV contrast dye, afib w/ RVR, low BP, CHF Plan: 1 Furosemide IV x 1 2 Supportive   Gottfried Standish C 08/29/2017, 5:41 PM

## 2017-08-29 NOTE — Progress Notes (Signed)
Placed patient on CPAP via FFM, 10.0 cm H20 with 2L O2 bleed in.  Per RN, Pt. Awakened with SOB and increased WOB.

## 2017-08-29 NOTE — Consult Note (Signed)
Name: Sylvia Craig MRN: [id number] DOB: 1951-04-27    ADMISSION DATE:  08/25/2017 CONSULTATION DATE:  08/30/17  REFERRING MD :  Pollie Meyer  CHIEF COMPLAINT:  AMS   HISTORY OF PRESENT ILLNESS:    66 year old woman. She has a past medical history of three-vessel coronary artery disease, heart failure with either preserved or recovered EF. The most recent EF is 45% from 2017 from [place]. She additionally atrial fibrillation on Eliquis and mitral regurgitation. Additional pertinent history includes insulin-dependent diabetes. She has had recent outpatient prescriptions for ciprofloxacin and Keflex. Recent left hand complicated wound that required surgical debridement and is followed by plastic surgery. She presented to the emergency department 4 days ago and was admitted to the medical ward. According to the history and physical on admission she had complained about weakness and falls as well as dull ache in her left shoulder rating up into the left neck. At that time should been on ciprofloxacin for one day for a UTI diagnosed outpatient. She was admitted for presumed sepsis secondary to urinary tract infection complicated by A. fib RVR and acute kidney injury. She was treated with ceftriaxone for which her recent outpatient urinary culture was sensitive. Additionally her presentation she had an elevated troponin of 1.2 to repeat of 1.08 and is now trended down 0.19. Per the medical record cardiology was contacted by the emergency department but has not formally consulted.While her EKGs clearly did not illustrate ST segment elevations I feel that there are significant ST segment changes in leads V5 and V6 as well as possibly in lead 23 and aVF. Not clear if these represent a nonischemic features such as rate related changes at the QRS to somewhat widened and may be rate related bundle branch block. She had a CT of her chest on admission without lobar infiltrate but with bilateral groundglass opacities,  cardiomegaly coronary arteriosclerosis aortic atherosclerosis dilated pulmonary artery. Urine culture and blood culture been negative to date. Her acute kidney injury is worsened and she was seen by nephrology that recommended supportive care and furosemide 1. Initial pro-calcitonin was 42 which is consistent with severe sepsis and her urinalysis showed leukocyte esterase. Her antibiotic regimen was changed to Zosyn. Today she was more somnolent and an ABG was obtained which showed acute hypercarbic respiratory failure she was placed on BiPAP and repeat ABG showed improvement of her hypercapnia. Her ABG also supports a component of non-anion gap metabolic acidosis. At the time my exam she is sleepy, denies shortness of breath chest pain nausea vomiting abdominal pain.  PAST MEDICAL HISTORY :   has a past medical history of CHF (congestive heart failure) (HCC); Diabetes mellitus without complication (HCC); Hypertension; Iron (Fe) deficiency anemia; and Thyroid disease.  has a past surgical history that includes I&D extremity (Left, 07/24/2017); I&D extremity (Left, 07/27/2017); and Application of a-cell of extremity (Left, 07/27/2017). Prior to Admission medications   Medication Sig Start Date End Date Taking? Authorizing Provider  acetaminophen (TYLENOL) 500 MG tablet Take 1,000 mg by mouth every 6 (six) hours as needed for moderate pain or headache.    Yes [provider]  Cholecalciferol (VITAMIN D-1000 MAX ST) 1000 units tablet Take 1,000 Units by mouth at bedtime.    Yes [provider]  ELIQUIS 5 MG TABS tablet Take 5 mg by mouth 2 (two) times daily. 07/30/17  Yes [provider]  Ferrous Fumarate (HEMOCYTE - 106 MG FE) 324 (106 Fe) MG TABS tablet Take 1 tablet by mouth 2 (two) times  daily.   Yes [provider]  furosemide (LASIX) 40 MG tablet Take 1 tablet (40 mg total) by mouth daily. 07/30/17  Yes Garth Bigness, MD  insulin glargine (LANTUS) 100 UNIT/ML  injection Inject 18 Units into the skin at bedtime.    Yes [provider]  insulin lispro (HUMALOG KWIKPEN) 100 UNIT/ML KiwkPen Inject 0.04 mLs (4 Units total) into the skin 3 (three) times daily. 07/30/17  Yes Garth Bigness, MD  levothyroxine (SYNTHROID, LEVOTHROID) 137 MCG tablet Take 137 mcg by mouth daily before breakfast.    Yes [provider]  metoprolol succinate (TOPROL-XL) 100 MG 24 hr tablet Take 1 tablet (100 mg total) by mouth daily. 07/30/17  Yes Garth Bigness, MD  Multiple Vitamin (MULTIVITAMIN WITH MINERALS) TABS tablet Take 1 tablet by mouth daily. 07/30/17  Yes Garth Bigness, MD  nitroGLYCERIN (NITROSTAT) 0.4 MG SL tablet Take 0.4 mg under the toungue as needed for chest pain 12/18/14  Yes [provider]  rosuvastatin (CRESTOR) 10 MG tablet Take 10 mg by mouth at bedtime.  06/04/16  Yes [provider]  vitamin C (ASCORBIC ACID) 500 MG tablet Take 1,000 mg by mouth at bedtime.    Yes [provider]  zinc sulfate 220 (50 Zn) MG capsule Take 1 capsule (220 mg total) by mouth daily. 07/30/17  Yes Garth Bigness, MD  apixaban (ELIQUIS) 2.5 MG TABS tablet Take 2 tablets (5 mg total) by mouth 2 (two) times daily. Patient not taking: Reported on 08/25/2017 07/30/17   Garth Bigness, MD  cephALEXin (KEFLEX) 500 MG capsule Take 1 capsule (500 mg total) by mouth every 12 (twelve) hours. Patient not taking: Reported on 08/11/2017 07/30/17   Garth Bigness, MD   Allergies  Allergen Reactions  . Glucosamine Shortness Of Breath  . Shellfish-Derived Products Shortness Of Breath  . Codeine Other (See Comments)    Rash, edema  . Atorvastatin Other (See Comments)    Myalgias   . Lisinopril Other (See Comments)    Cough   . Other Other (See Comments)    Crab, chest tightness  . Oxycodone-Aspirin Other (See Comments)    Rash, dreams    FAMILY HISTORY:  family history is not on file. SOCIAL HISTORY:  reports that she quit  smoking about 29 years ago. Her smoking use included Cigarettes. She smoked 1.00 pack per day. She has never used smokeless tobacco. She reports that she does not drink alcohol or use drugs.  REVIEW OF SYSTEMS:   Unable to obtain comprehensive review of systems due to confusion.  SUBJECTIVE:   VITAL SIGNS: Temp:  [97.6 F (36.4 C)-98.5 F (36.9 C)] 97.9 F (36.6 C) (09/03 1932) Pulse Rate:  [38-113] 79 (09/03 2325) Resp:  [14-29] 19 (09/03 2325) BP: (118-156)/(75-89) 140/89 (09/03 2325) SpO2:  [89 %-100 %] 99 % (09/03 2325) FiO2 (%):  [30 %] 30 % (09/03 2111)  PHYSICAL EXAMINATION: General:  Obese, chronically ill, older than stated age Neuro:  GCS 13, awakens to voice follows commands confused, sleepy HEENT: EOMI ATNC PERRLA pale conjunctiva Cardiovascular:  Regular rate and rhythm, no murmur delayed capillary refill bilateral lower extremity edema Lungs:  Coarse crackles throughout good tidal volume does not appear to be using accessory muscles or struggling to breathe on BiPAP Abdomen:  Protuberant positive bowel sounds soft nontender nondistended no guarding Musculoskeletal:  No red warm swollen tender deformed joints Skin:  Bandage on left hand at site of recent soft tissue wound not undressed. Bilateral lower extremity wounds  appear consistent with venous stasis disease. Cannot rule out underlying cellulitis but does not strongly appear consistent.   Recent Labs Lab 08/27/17 0303 08/28/17 0306 08/29/17 0138  NA 133* 134* 135  K 4.3 4.6 4.6  CL 100* 100* 102  CO2 22 24 23   BUN 87* 93* 99*  CREATININE 2.70* 3.19* 3.61*  GLUCOSE 65 114* 152*    Recent Labs Lab 08/27/17 0303 08/28/17 0306 08/29/17 0138  HGB 11.3* 11.2* 11.4*  HCT 34.4* 34.5* 35.8*  WBC 17.8* 16.8* 20.7*  PLT 121* 122* 135*   Dg Chest Port 1 View  Result Date: 08/29/2017 CLINICAL DATA:  Shortness of breath, CHF, diabetes and hypertension EXAM: PORTABLE CHEST 1 VIEW COMPARISON:  08/25/2017  FINDINGS: Stable cardiomegaly with vascular and interstitial prominence diffusely, suspect mild edema. Minor associated basilar atelectasis. No enlarging effusion or pneumothorax. Trachea is midline. Atherosclerosis of the aorta and degenerative changes of the spine. IMPRESSION: Stable cardiomegaly and mild interstitial edema pattern. Bibasilar atelectasis. Electronically Signed   By: Judie Petit.  Shick M.D.   On: 08/29/2017 11:06    ASSESSMENT / PLAN: Based on my review of the medical record I support that on admission the patient presented with severe sepsis most likely due to urinary tract infection due to Escherichia coli per medical record report of outside culture. After 4 days of not decompensating and not overly improving I find it unlikely that this is due to partially or untreated infection given the relatively broad antibiotic she was on. I feel that she had a resistant gram-positive infection such as MRSA cellulitis she would've decompensated more. I suspect her underlying infection is being adequately treated and appropriate calcitonin if rechecked would be significantly lower. I think she can likely be switched off vancomycin and Zosyn to simple cephalosporin.  Her encephalopathy may be related to her hypercapnia however her level of hypercapnia is somewhat low for causing narcosis. Because of her hypercapnia could be multifactorial and likely is. Based on her body habitus she likely has some component of obstructive sleep apnea. Bilateral groundglass opacities seen on her chest CT as well as the appearance of her chest x-ray today suggests that pulmonary edema could be a component of why she is having hypercapnia. Given her severe coronary artery disease, history of reduced ejection fraction, chronic heart failure with preserved ejection fraction, mitral regurgitation, evidence of pulmonary hypertension, and presentation with chest/neck pain and elevated troponin feel there is concern that this could be  a new ischemic event independent of or due to her underlying infection and more than a type II and STEMI due to demand ischemia.  Given that she is at least 3.7 L positive and I feel that her sepsis has resolved to the point that more aggressive diuresis can be attempted to improve her likely hypervolemia.  Recommendations 1. Continue with BiPAP 2. Cannot recommend transfer to medical ICU at this time excellent  3. Recommend repeating pro-calcitonin and consideration of de-escalating antibiotics back to Rocephin based on results 4. Follow-up echocardiogram 5. Formal cardiology consult 6. Strict I/O, daily weight, diuresis with IV Lasix boluses repeated 1 urine output decreases to achieve negative fluid balance of at least 1.5 L per day 7. She should have a Foley catheter for diuresis, she is in progressive care unit, and her status is not improving and may be worsening the risk-benefit ratio of CVA UTI versus inadequate medical management favors placing the Foley 8. Consideration of starting aspirin if no contraindication, can defer to cardiology  Thank  you for this consult,  Caro Larocheennis M Chana Lindstrom, M.D.  Pulmonary and Critical Care Medicine [facility] Pager: [phone]  08/29/2017, 11:44 PM

## 2017-08-29 NOTE — Progress Notes (Signed)
Pharmacy Antibiotic Note  Sylvia Craig is a 66 y.o. female admitted on 08/25/2017 with pneumonia and wound infection. She has remained afebrile, but WBC's are elevated at 20.7, procal on 8/31 was 42.23, and she has had increased SOB. The wounds are erythematous, weeping, and are concerning for cellulitis.  Zosyn was started on 8/31 and vancomycin is being added today due to the cellulitis concern. Vancomycin trough goal will be 10-15.  Plan:  Continue Zosyn 3.375gm IV Q8h Start Vancomycin load at 2500mg  Then Vancomycin 1750mg  Q48h Follow Cx's, clinical status, LOT  Temp (24hrs), Avg:98.1 F (36.7 C), Min:97.6 F (36.4 C), Max:98.5 F (36.9 C)   Recent Labs Lab 08/25/17 1920 08/26/17 0135 08/26/17 0136 08/27/17 0303 08/28/17 0306 08/29/17 0138  WBC 18.6*  --  20.4* 17.8* 16.8* 20.7*  CREATININE 2.90*  --  2.86* 2.70* 3.19* 3.61*  LATICACIDVEN  --  1.5  --   --   --   --     Estimated Creatinine Clearance: 20.1 mL/min (A) (by C-G formula based on SCr of 3.61 mg/dL (H)).    Antimicrobials this admission: CTX x 1 8/30 Zosyn 8/31>> Vancomycin 9/3>>  Microbiology results: Blood culture 8/31 > ngtd Mrsa pcr 8/31>> negtive Urine cx 8/31>> negative   Nolen MuAustin J Hollyanne Schloesser PharmD PGY1 Pharmacy Practice Resident 08/29/2017 2:31 PM Pager: [phone]

## 2017-08-29 NOTE — Progress Notes (Addendum)
Anticoagulation consult note  Pharmacy Consult for Heparin  Indication: atrial fibrillation  Vital Signs: Temp: 97.6 F (36.4 C) (09/03 0727) Temp Source: Axillary (09/03 0727) BP: 118/75 (09/03 0558) Pulse Rate: 109 (09/03 0727)  Labs:  Recent Labs  08/26/17 1026  08/27/17 0303 08/27/17 1317 08/28/17 0306 08/29/17 0138  HGB  --   < > 11.3*  --  11.2* 11.4*  HCT  --   --  34.4*  --  34.5* 35.8*  PLT  --   --  121*  --  122* 135*  APTT 51*  --  63* 84* 87* 142*  LABPROT 25.2*  --   --   --   --  20.5*  INR 2.31  --   --   --   --  1.77  HEPARINUNFRC >2.20*  --  >2.20* >2.20* >2.20* 2.20*  CREATININE  --   --  2.70*  --  3.19* 3.61*  TROPONINI 0.91*  --  0.56*  --   --   --   < > = values in this interval not displayed.  Estimated Creatinine Clearance: 20.1 mL/min (A) (by C-G formula based on SCr of 3.61 mg/dL (H)).  Assessment: [age] y.o. female with a history of Afib on Eliquis. Currently holding the Eliquis (last dose 8/30) and is on Heparin. Heparin levels are skewed by recent DOAC, so will be dosing based on aPTT's for the time being. It was decided based on her renal function, to start warfarin for long term anticoagulation.   Due to ARF, Eliquis is influencing HL, PT, and INR. APTT recheck at 1030 was elevated at 111. Will need to decrease heparin drip and recheck another aPTT. CBC stable, nurse noted some dried blood on nasal cannula, but no other concerns for bleeding.  Goal of Therapy:  INR 2-3 Heparin level 0.3-0.7 units/ml aPTT 66-102 seconds Monitor platelets by anticoagulation protocol: Yes   Plan:  Decrease Heparin to 1200 units/hr Warfarin 5mg  today APTT in 8 hours Daily HL, aPTT, CBC and INR Monitor for s/sx of bleeding   Nolen MuAustin J Elaya Droege PharmD PGY1 Pharmacy Practice Resident 08/29/2017 10:03 AM Pager: [phone]

## 2017-08-30 ENCOUNTER — Other Ambulatory Visit (HOSPITAL_COMMUNITY): Payer: Medicare Other

## 2017-08-30 ENCOUNTER — Encounter (HOSPITAL_COMMUNITY): Payer: Self-pay | Admitting: Cardiology

## 2017-08-30 ENCOUNTER — Inpatient Hospital Stay (HOSPITAL_COMMUNITY): Payer: Medicare Other

## 2017-08-30 ENCOUNTER — Ambulatory Visit: Payer: Medicare Other | Attending: Family Medicine | Admitting: Occupational Therapy

## 2017-08-30 ENCOUNTER — Ambulatory Visit: Payer: Medicare Other | Admitting: Physical Therapy

## 2017-08-30 DIAGNOSIS — I5032 Chronic diastolic (congestive) heart failure: Secondary | ICD-10-CM

## 2017-08-30 DIAGNOSIS — I2510 Atherosclerotic heart disease of native coronary artery without angina pectoris: Secondary | ICD-10-CM

## 2017-08-30 DIAGNOSIS — R748 Abnormal levels of other serum enzymes: Secondary | ICD-10-CM

## 2017-08-30 DIAGNOSIS — I361 Nonrheumatic tricuspid (valve) insufficiency: Secondary | ICD-10-CM

## 2017-08-30 LAB — BLOOD GAS, ARTERIAL
ACID-BASE DEFICIT: 2.7 mmol/L — AB (ref 0.0–2.0)
ACID-BASE DEFICIT: 1.6 mmol/L (ref 0.0–2.0)
BICARBONATE: 23.5 mmol/L (ref 20.0–28.0)
BICARBONATE: 25 mmol/L (ref 20.0–28.0)
DELIVERY SYSTEMS: POSITIVE
DRAWN BY: 257081
Drawn by: 301361
EXPIRATORY PAP: 6
FIO2: 30
FIO2: 36
Inspiratory PAP: 14
Mode: POSITIVE
O2 Saturation: 96.1 %
O2 Saturation: 97.9 %
PATIENT TEMPERATURE: 97.6
PCO2 ART: 53.4 mmHg — AB (ref 32.0–48.0)
PH ART: 7.262 — AB (ref 7.350–7.450)
PO2 ART: 83.9 mmHg (ref 83.0–108.0)
PO2 ART: 112 mmHg — AB (ref 83.0–108.0)
Patient temperature: 97.3
pCO2 arterial: 59.4 mmHg — ABNORMAL HIGH (ref 32.0–48.0)
pH, Arterial: 7.243 — ABNORMAL LOW (ref 7.350–7.450)

## 2017-08-30 LAB — ECHOCARDIOGRAM COMPLETE
AOASC: 34 cm
CHL CUP TV REG PEAK VELOCITY: 342 cm/s
E decel time: 246 msec
FS: 21 % — AB (ref 28–44)
Height: 65 in
IV/PV OW: 1.02
LA diam end sys: 42 mm
LA diam index: 1.72 cm/m2
LA vol index: 33.9 mL/m2
LASIZE: 42 mm
LAVOL: 83 mL
LAVOLA4C: 92.5 mL
LDCA: 2.84 cm2
LVOT diameter: 19 mm
MV Dec: 246
MVPG: 6 mmHg
MVPKAVEL: 39.8 m/s
MVPKEVEL: 122 m/s
PW: 12.5 mm — AB (ref 0.6–1.1)
RV sys press: 62 mmHg
TAPSE: 12.9 mm
TRMAXVEL: 342 cm/s
WEIGHTICAEL: 4338.65 [oz_av]

## 2017-08-30 LAB — RENAL FUNCTION PANEL
Albumin: 2.1 g/dL — ABNORMAL LOW (ref 3.5–5.0)
Anion gap: 12 (ref 5–15)
BUN: 101 mg/dL — ABNORMAL HIGH (ref 6–20)
CHLORIDE: 102 mmol/L (ref 101–111)
CO2: 23 mmol/L (ref 22–32)
CREATININE: 3.96 mg/dL — AB (ref 0.44–1.00)
Calcium: 8.9 mg/dL (ref 8.9–10.3)
GFR calc non Af Amer: 11 mL/min — ABNORMAL LOW (ref >60)
GFR, EST AFRICAN AMERICAN: 13 mL/min — AB (ref >60)
Glucose, Bld: 129 mg/dL — ABNORMAL HIGH (ref 65–99)
POTASSIUM: 4.6 mmol/L (ref 3.5–5.1)
Phosphorus: 5.6 mg/dL — ABNORMAL HIGH (ref 2.5–4.6)
Sodium: 137 mmol/L (ref 135–145)

## 2017-08-30 LAB — HEPARIN LEVEL (UNFRACTIONATED): HEPARIN UNFRACTIONATED: 1.52 [IU]/mL — AB (ref 0.30–0.70)

## 2017-08-30 LAB — GLUCOSE, CAPILLARY
GLUCOSE-CAPILLARY: 113 mg/dL — AB (ref 65–99)
Glucose-Capillary: 124 mg/dL — ABNORMAL HIGH (ref 65–99)
Glucose-Capillary: 127 mg/dL — ABNORMAL HIGH (ref 65–99)
Glucose-Capillary: 110 mg/dL — ABNORMAL HIGH (ref 65–99)

## 2017-08-30 LAB — PROTIME-INR
INR: 1.63
Prothrombin Time: 19.2 seconds — ABNORMAL HIGH (ref 11.4–15.2)

## 2017-08-30 LAB — APTT: APTT: 109 s — AB (ref 24–36)

## 2017-08-30 LAB — PROCALCITONIN: PROCALCITONIN: 5.81 ng/mL

## 2017-08-30 MED ORDER — WARFARIN SODIUM 5 MG PO TABS
5.0000 mg | ORAL_TABLET | Freq: Once | ORAL | Status: AC
  Administered 2017-08-31: 5 mg via ORAL
  Filled 2017-08-30: qty 1

## 2017-08-30 MED ORDER — INSULIN ASPART 100 UNIT/ML ~~LOC~~ SOLN
0.0000 [IU] | SUBCUTANEOUS | Status: DC
  Administered 2017-08-31: 2 [IU] via SUBCUTANEOUS
  Administered 2017-08-31: 12 [IU] via SUBCUTANEOUS
  Administered 2017-08-31: 4 [IU] via SUBCUTANEOUS

## 2017-08-30 MED ORDER — FUROSEMIDE 10 MG/ML IJ SOLN
80.0000 mg | Freq: Once | INTRAMUSCULAR | Status: AC
Start: 2017-08-30 — End: 2017-08-30
  Administered 2017-08-30: 80 mg via INTRAVENOUS
  Filled 2017-08-30: qty 8

## 2017-08-30 MED ORDER — PIPERACILLIN-TAZOBACTAM IN DEX 2-0.25 GM/50ML IV SOLN
2.2500 g | Freq: Four times a day (QID) | INTRAVENOUS | Status: DC
  Administered 2017-08-30 – 2017-08-31 (×5): 2.25 g via INTRAVENOUS
  Filled 2017-08-30 (×6): qty 50

## 2017-08-30 MED ORDER — PERFLUTREN LIPID MICROSPHERE
1.0000 mL | INTRAVENOUS | Status: AC | PRN
  Administered 2017-08-30: 4 mL via INTRAVENOUS
  Filled 2017-08-30: qty 10

## 2017-08-30 NOTE — Progress Notes (Signed)
RT took patient off bipap and placed on a 4L [place]. Patient is tolerating well at this time. RN is aware.

## 2017-08-30 NOTE — Progress Notes (Signed)
Pharmacy Antibiotic Note  Sylvia NightDebbie Fisch is a 66 y.o. female with concern of sepsis/UTI and wound infection on zosyn D4 and vancomycin D2 -WBC= 20.7, afebrile, PCT 42>>5, cultures ngtd -SCr= 3.96 w/ trend up, CrCL ~ 20  Spoke with MD; no changes in antibiotics due to worsening patient status   Plan:  -Change zosyn to 2.25gm IV q6h -Discontinue vancomycin and check a random level in am -Will follow renal function, cultures and clinical progress   Temp (24hrs), Avg:97.8 F (36.6 C), Min:97.5 F (36.4 C), Max:98 F (36.7 C)   Recent Labs Lab 08/25/17 1920 08/26/17 0135 08/26/17 0136 08/27/17 0303 08/28/17 0306 08/29/17 0138 08/29/17 1909 08/30/17 0715  WBC 18.6*  --  20.4* 17.8* 16.8* 20.7*  --   --   CREATININE 2.90*  --  2.86* 2.70* 3.19* 3.61*  --  3.96*  LATICACIDVEN  --  1.5  --   --   --   --  1.1  --     Estimated Creatinine Clearance: 18.2 mL/min (A) (by C-G formula based on SCr of 3.96 mg/dL (H)).    Antimicrobials this admission: CTX x 1 8/30 Zosyn 8/31>> Vancomycin 9/3>>  Microbiology results: Blood culture 8/31 > ngtd Mrsa pcr 8/31>> negtive Urine cx 8/31>> negative  Harland Germanndrew Ashleen Demma, Pharm D 08/30/2017 11:30 AM

## 2017-08-30 NOTE — Progress Notes (Signed)
Anticoagulation consult note  Pharmacy Consult for Heparin/coumadin Indication: atrial fibrillation  Vital Signs: Temp: 97.3 F (36.3 C) (09/04 1158) Temp Source: Axillary (09/04 1158) BP: 131/76 (09/04 1158) Pulse Rate: 99 (09/04 0745)  Labs:  Recent Labs  08/28/17 0306 08/29/17 0138 08/29/17 1030 08/29/17 1057 08/29/17 2107 08/30/17 0715 08/30/17 1217  HGB 11.2* 11.4*  --   --   --   --   --   HCT 34.5* 35.8*  --   --   --   --   --   PLT 122* 135*  --   --   --   --   --   APTT 87* 142* 111*  --  80*  --  109*  LABPROT  --  20.5*  --   --   --   --  19.2*  INR  --  1.77  --   --   --   --  1.63  HEPARINUNFRC >2.20* 2.20*  --   --   --   --   --   CREATININE 3.19* 3.61*  --   --   --  3.96*  --   TROPONINI  --   --   --  0.19*  --   --   --     Estimated Creatinine Clearance: 18.2 mL/min (A) (by C-G formula based on SCr of 3.96 mg/dL (H)).  Assessment: 66 y.o. female with a history of Afib on Eliquis (last dose was 8/30). Due to AKI she is now on heparin and coumadin. An elevated heparin level of 2.2 indicated that there is some apixaban influence and INR is likely not reliable. The coumadin dose was missed 9/3 as the patient was on BIPAP.  -INR= 1.63 with trend down -aPTT= 109  Goal of Therapy:  INR 2-3 Heparin level 0.3-0.7 units/ml aPTT 66-102 seconds Monitor platelets by anticoagulation protocol: Yes   Plan:  -Decrease heparin to 1100 units/hr -Coumadin 5mg  po x1 -Daily CBC heparin level, aPTT and INR  Harland GermanAndrew Vienne Corcoran, Pharm D 08/30/2017 2:06 PM

## 2017-08-30 NOTE — Discharge Instructions (Addendum)
You were admitted with sepsis due to a urinary tract infection. You will need to continue taking your antibiotic, Keflex, twice a day until September 14. You will be discharged to a skilled nursing facility where they will work to try and improve your weakness and decrease your chances of a fall. You will also be sent home with a new CPAP. It is very important that you wear this every night as you go to bed. [facility] after you leave the SNF. The facility will set up the appointment.   Information on my medicine - Coumadin   (Warfarin)  This medication education was reviewed with me or my healthcare representative as part of my discharge preparation.    Why was Coumadin prescribed for you? Coumadin was prescribed for you because you have a blood clot or a medical condition that can cause an increased risk of forming blood clots. Blood clots can cause serious health problems by blocking the flow of blood to the heart, lung, or brain. Coumadin can prevent harmful blood clots from forming. As a reminder your indication for Coumadin is:   Stroke Prevention Because Of Atrial Fibrillation  What test will check on my response to Coumadin? While on Coumadin (warfarin) you will need to have an INR test regularly to ensure that your dose is keeping you in the desired range. The INR (international normalized ratio) number is calculated from the result of the laboratory test called prothrombin time (PT).  If an INR APPOINTMENT HAS NOT ALREADY BEEN MADE FOR YOU please schedule an appointment to have this lab work done by your health care provider within 7 days. Your INR goal is usually a number between:  2 to 3 or your provider may give you a more narrow range like 2-2.5.  Ask your health care provider during an office visit what your goal INR is.  What  do you need to  know  About  COUMADIN? Take Coumadin (warfarin) exactly as prescribed by your healthcare provider about  the same time each day.  DO NOT stop taking without talking to the doctor who prescribed the medication.  Stopping without other blood clot prevention medication to take the place of Coumadin may increase your risk of developing a new clot or stroke.  Get refills before you run out.  What do you do if you miss a dose? If you miss a dose, take it as soon as you remember on the same day then continue your regularly scheduled regimen the next day.  Do not take two doses of Coumadin at the same time.  Important Safety Information A possible side effect of Coumadin (Warfarin) is an increased risk of bleeding. You should call your healthcare provider right away if you experience any of the following: ? Bleeding from an injury or your nose that does not stop. ? Unusual colored urine (red or dark brown) or unusual colored stools (red or black). ? Unusual bruising for unknown reasons. ? A serious fall or if you hit your head (even if there is no bleeding).  Some foods or medicines interact with Coumadin (warfarin) and might alter your response to warfarin. To help avoid this: ? Eat a balanced diet, maintaining a consistent amount of Vitamin K. ? Notify your provider about major diet changes you plan to make. ? Avoid alcohol or limit your intake to 1 drink for women and 2 drinks for men per day. (1 drink is 5 oz. wine,  12 oz. beer, or 1.5 oz. liquor.)  Make sure that ANY health care provider who prescribes medication for you knows that you are taking Coumadin (warfarin).  Also make sure the healthcare provider who is monitoring your Coumadin knows when you have started a new medication including herbals and non-prescription products.  Coumadin (Warfarin)  Major Drug Interactions  Increased Warfarin Effect Decreased Warfarin Effect  Alcohol (large quantities) Antibiotics (esp. Septra/Bactrim, Flagyl, Cipro) Amiodarone (Cordarone) Aspirin (ASA) Cimetidine (Tagamet) Megestrol (Megace) NSAIDs  (ibuprofen, naproxen, etc.) Piroxicam (Feldene) Propafenone (Rythmol SR) Propranolol (Inderal) Isoniazid (INH) Posaconazole (Noxafil) Barbiturates (Phenobarbital) Carbamazepine (Tegretol) Chlordiazepoxide (Librium) Cholestyramine (Questran) Griseofulvin Oral Contraceptives Rifampin Sucralfate (Carafate) Vitamin K   Coumadin (Warfarin) Major Herbal Interactions  Increased Warfarin Effect Decreased Warfarin Effect  Garlic Ginseng Ginkgo biloba Coenzyme Q10 Green tea St. Johns wort    Coumadin (Warfarin) FOOD Interactions  Eat a consistent number of servings per week of foods HIGH in Vitamin K (1 serving =  cup)  Collards (cooked, or boiled & drained) Kale (cooked, or boiled & drained) Mustard greens (cooked, or boiled & drained) Parsley *serving size only =  cup Spinach (cooked, or boiled & drained) Swiss chard (cooked, or boiled & drained) Turnip greens (cooked, or boiled & drained)  Eat a consistent number of servings per week of foods MEDIUM-HIGH in Vitamin K (1 serving = 1 cup)  Asparagus (cooked, or boiled & drained) Broccoli (cooked, boiled & drained, or raw & chopped) Brussel sprouts (cooked, or boiled & drained) *serving size only =  cup Lettuce, raw (green leaf, endive, romaine) Spinach, raw Turnip greens, raw & chopped   These websites have more information on Coumadin (warfarin):  FailFactory.se; VeganReport.com.au;

## 2017-08-30 NOTE — Progress Notes (Signed)
Followed up on patient later this evening, patient had improved from earlier today, less somnolent and more alert, able to answer questions. Patient O2 sat in the upper 90's, review CCM consult note, appreciate recs. Discussed with nurse treatment plan. Will keep on Bipap overnight and reevaluate in the morning.  Sylvia NeighboursAbdoulaye Candy Ziegler, MD [facility], PGY-2

## 2017-08-30 NOTE — Progress Notes (Signed)
  Echocardiogram 2D Echocardiogram has been performed.  Sylvia Craig Sylvia Craig 08/30/2017, 4:45 PM

## 2017-08-30 NOTE — Progress Notes (Signed)
Assessment:  1 CKD 3 2 AKI, hemodynamically mediated due to sepsis, IV contrast dye, afib w/ RVR, low BP, CHF Plan: 1 Furosemide again x 1 2 Supportive  Subjective: Interval History: Alert today  Objective: Vital signs in last 24 hours: Temp:  [97.5 F (36.4 C)-98 F (36.7 C)] 97.5 F (36.4 C) (09/04 0901) Pulse Rate:  [38-107] 99 (09/04 0745) Resp:  [9-27] 9 (09/04 0745) BP: (122-156)/(74-101) 141/79 (09/04 0901) SpO2:  [89 %-100 %] 98 % (09/04 1158) FiO2 (%):  [30 %] 30 % (09/04 0745) Weight:  [123 kg (271 lb 2.7 oz)] 123 kg (271 lb 2.7 oz) (09/04 0416) Weight change:   Intake/Output from previous day: 09/03 0701 - 09/04 0700 In: 1024.8 [P.O.:240; I.V.:184.8; IV Piggyback:600] Out: 1400 [Urine:1300; Stool:100] Intake/Output this shift: No intake/output data recorded.  General appearance: alert and cooperative Resp: clear to auscultation bilaterally Cardio: regular rate and rhythm, S1, S2 normal, no murmur, click, rub or gallop Extremities: edema 1+  Lab Results:  Recent Labs  08/28/17 0306 08/29/17 0138  WBC 16.8* 20.7*  HGB 11.2* 11.4*  HCT 34.5* 35.8*  PLT 122* 135*   BMET:  Recent Labs  08/29/17 0138 08/30/17 0715  NA 135 137  K 4.6 4.6  CL 102 102  CO2 23 23  GLUCOSE 152* 129*  BUN 99* 101*  CREATININE 3.61* 3.96*  CALCIUM 8.8* 8.9   No results for input(s): PTH in the last 72 hours. Iron Studies: No results for input(s): IRON, TIBC, TRANSFERRIN, FERRITIN in the last 72 hours. Studies/Results: Dg Chest Port 1 View  Result Date: 08/29/2017 CLINICAL DATA:  Shortness of breath, CHF, diabetes and hypertension EXAM: PORTABLE CHEST 1 VIEW COMPARISON:  08/25/2017 FINDINGS: Stable cardiomegaly with vascular and interstitial prominence diffusely, suspect mild edema. Minor associated basilar atelectasis. No enlarging effusion or pneumothorax. Trachea is midline. Atherosclerosis of the aorta and degenerative changes of the spine. IMPRESSION: Stable  cardiomegaly and mild interstitial edema pattern. Bibasilar atelectasis. Electronically Signed   By: Judie PetitM.  Shick M.D.   On: 08/29/2017 11:06    Scheduled: . cholecalciferol  1,000 Units Oral QHS  . collagenase   Topical Daily  . Ferrous Fumarate  1 tablet Oral BID WC  . furosemide  80 mg Intravenous Once  . Gerhardt's butt cream   Topical BID  . insulin aspart  0-24 Units Subcutaneous Q4H  . insulin glargine  10 Units Subcutaneous QHS  . levothyroxine  137 mcg Oral QAC breakfast  . mouth rinse  15 mL Mouth Rinse BID  . metoprolol succinate  100 mg Oral Daily  . multivitamin with minerals  1 tablet Oral Daily  . rosuvastatin  10 mg Oral q1800  . sodium chloride flush  3 mL Intravenous Q12H  . vitamin C  1,000 mg Oral QHS  . warfarin  5 mg Oral ONCE-1800  . Warfarin - Pharmacist Dosing Inpatient   Does not apply q1800  . zinc sulfate  220 mg Oral Daily    LOS: 5 days   Marvina Danner C 08/30/2017,12:10 PM

## 2017-08-30 NOTE — Care Management Important Message (Signed)
Important Message  Patient Details  Name: Sylvia Craig MRN: [id number] Date of Birth: July 21, 1951   Medicare Important Message Given:  Yes    [name] 08/30/2017, 10:58 AM

## 2017-08-30 NOTE — Consult Note (Signed)
The patient has been seen in conjunction with Berton Bon, NP. All aspects of care have been considered and discussed. The patient has been personally interviewed, examined, and all clinical data has been reviewed.   Complex situation in this patient with known severe coronary artery disease (three-vessel coronary calcification on CT chest, known total occlusion of circumflex and right coronary, multiple risk factors including diabetes, chronic kidney disease stage IV, obstructive sleep apnea,) and persistently elevated troponin.  There may be contribution to chronic respiratory failure from acute heart failure. Attempts at diuresis have been made but kidney injury prevents further aggressive attempts. Echo is pending.  Risk factor modification including LDL less than 70, tight glycemic control, blood pressure control 130/90 mmHg or less, using beta blocker therapy and dihydropyridine calcium channel blocker therapy. Avoid ACE/ARB with severe kidney disease noted. It would be appropriate to add aspirin 81 mg per day. If absolute platelet count decreases less than 50,000, would consider discontinuation of aspirin. Would recommend a proton pump inhibitor if aspirin is used.  The patient is not a candidate for invasive strategy given co-morbidities and concomitant acute medical illness.  Overall prognosis appears guarded.    Cardiology Consultation:   Patient ID: Sylvia Craig; [id number]; 12-09-1951   Admit date: 08/25/2017 Date of Consult: 08/30/2017  Primary Care Provider: [name], MD Primary Cardiologist: Dr. Tami Ribas, [facility]  Patient Profile:   Sylvia Craig is a 66 y.o. female with a hx of HTN, hypothyroidism, HLD, [name], IDDM, heart failure with preserved EF [name] who is being seen today for the evaluation of CHF and elevated troponin at the request of Dr. Pollie Meyer.  History of Present Illness:   Sylvia Craig presented on 08/25/2017  for evaluation of left shoulder ache, weakness and UTI diagnosed at PCP.  She is being treated for sepsis secondary to UTI, hypercapnea, acute on chronic renal failure and chronic diastolic heart failure. Upon examination the patient is alert but not answering questions appropriately. She answers "2014" to every question for me. She is currently on nasal cannula, off bipap around 11 am per nurse. She has a foley cath and rectal tube. Her legs are wrapped with Kerlex.   The patient is seen by [facility] Tenny [facility]. She was last seen in the office on 06/28/2016 at which time she was stable on medical therapy.   Significant findings: Troponins 1.08, 0.91, 0.56, 0.19 CXR: Cardiomegaly with aortic atherosclerosis. Slightly low lung volumes. No acute pulmonary disease. CTA chest:  1. Cardiomegaly with coronary arteriosclerosis. 2. Aortic atherosclerosis without aneurysm or dissection. 3. No acute pulmonary embolus. Mild dilatation main pulmonary artery consistent with a component of pulmonary hypertension. 4. Nonspecific ground-glass opacities throughout both lungs burning with differential possibilities that may include hypoventilatory change, alveolitis/pneumonitis, atypical infection or stigmata of pulmonary edema among some considerations.  Past Medical History:  Diagnosis Date  . CHF (congestive heart failure) (HCC)   . Diabetes mellitus without complication (HCC)   . Hypertension   . Iron (Fe) deficiency anemia   . Thyroid disease     Past Surgical History:  Procedure Laterality Date  . APPLICATION OF A-CELL OF EXTREMITY Left 07/27/2017   Procedure: APPLICATION OF A-CELL;  Surgeon: Peggye Form, DO;  Location: [facility] OR;  Service: Plastics;  Laterality: Left;  . I&D EXTREMITY Left 07/24/2017   Procedure: IRRIGATION AND DEBRIDEMENT EXTREMITY;  Surgeon: Sheral Apley, MD;  Location: [facility] OR;  Service: Orthopedics;  Laterality:  Left;  . I&D EXTREMITY Left 07/27/2017    Procedure: IRRIGATION AND DEBRIDEMENT OF LEFT HAND;  Surgeon: Peggye Form, DO;  Location: [facility] OR;  Service: Plastics;  Laterality: Left;     Home Medications:  Prior to Admission medications   Medication Sig Start Date End Date Taking? Authorizing Provider  acetaminophen (TYLENOL) 500 MG tablet Take 1,000 mg by mouth every 6 (six) hours as needed for moderate pain or headache.    Yes [provider]  Cholecalciferol (VITAMIN D-1000 MAX ST) 1000 units tablet Take 1,000 Units by mouth at bedtime.    Yes [provider]  ELIQUIS 5 MG TABS tablet Take 5 mg by mouth 2 (two) times daily. 07/30/17  Yes [provider]  Ferrous Fumarate (HEMOCYTE - 106 MG FE) 324 (106 Fe) MG TABS tablet Take 1 tablet by mouth 2 (two) times daily.   Yes [provider]  furosemide (LASIX) 40 MG tablet Take 1 tablet (40 mg total) by mouth daily. 07/30/17  Yes Garth Bigness, MD  insulin glargine (LANTUS) 100 UNIT/ML injection Inject 18 Units into the skin at bedtime.    Yes [provider]  insulin lispro (HUMALOG KWIKPEN) 100 UNIT/ML KiwkPen Inject 0.04 mLs (4 Units total) into the skin 3 (three) times daily. 07/30/17  Yes Garth Bigness, MD  levothyroxine (SYNTHROID, LEVOTHROID) 137 MCG tablet Take 137 mcg by mouth daily before breakfast.    Yes [provider]  metoprolol succinate (TOPROL-XL) 100 MG 24 hr tablet Take 1 tablet (100 mg total) by mouth daily. 07/30/17  Yes Garth Bigness, MD  Multiple Vitamin (MULTIVITAMIN WITH MINERALS) TABS tablet Take 1 tablet by mouth daily. 07/30/17  Yes Garth Bigness, MD  nitroGLYCERIN (NITROSTAT) 0.4 MG SL tablet Take 0.4 mg under the toungue as needed for chest pain 12/18/14  Yes [provider]  rosuvastatin (CRESTOR) 10 MG tablet Take 10 mg by mouth at bedtime.  06/04/16  Yes [provider]  vitamin C (ASCORBIC ACID) 500 MG tablet Take 1,000 mg by mouth at bedtime.    Yes [provider]  zinc sulfate 220 (50 Zn) MG capsule Take 1 capsule (220 mg total) by mouth daily. 07/30/17  Yes Garth Bigness, MD  apixaban (ELIQUIS) 2.5 MG TABS tablet Take 2 tablets (5 mg total) by mouth 2 (two) times daily. Patient not taking: Reported on 08/25/2017 07/30/17   Garth Bigness, MD  cephALEXin (KEFLEX) 500 MG capsule Take 1 capsule (500 mg total) by mouth every 12 (twelve) hours. Patient not taking: Reported on 08/11/2017 07/30/17   Garth Bigness, MD    Inpatient Medications: Scheduled Meds: . cholecalciferol  1,000 Units Oral QHS  . collagenase   Topical Daily  . Ferrous Fumarate  1 tablet Oral BID WC  . Gerhardt's butt cream   Topical BID  . insulin aspart  0-24 Units Subcutaneous Q4H  . insulin glargine  10 Units Subcutaneous QHS  . levothyroxine  137 mcg Oral QAC breakfast  . mouth rinse  15 mL Mouth Rinse BID  . metoprolol succinate  100 mg Oral Daily  . multivitamin with minerals  1 tablet Oral Daily  . rosuvastatin  10 mg Oral q1800  . sodium chloride flush  3 mL Intravenous Q12H  . vitamin C  1,000 mg Oral QHS  . warfarin  5 mg Oral ONCE-1800  . warfarin  5 mg Oral ONCE-1800  . Warfarin - Pharmacist Dosing Inpatient   Does not apply q1800  . zinc sulfate  220 mg Oral Daily   Continuous Infusions: . heparin 1,200 Units/hr (08/30/17 0820)  . piperacillin-tazobactam (ZOSYN)  IV     PRN Meds: acetaminophen **OR** acetaminophen, HYDROcodone-acetaminophen, levalbuterol, nitroGLYCERIN, ondansetron **OR** ondansetron (ZOFRAN) IV, senna-docusate  Allergies:    Allergies  Allergen Reactions  . Glucosamine Shortness Of Breath  . Shellfish-Derived Products Shortness Of Breath  . Codeine Other (See Comments)    Rash, edema  . Atorvastatin Other (See Comments)    Myalgias   . Lisinopril Other (See Comments)    Cough   . Other Other (See Comments)    Crab, chest tightness  . Oxycodone-Aspirin Other (See Comments)    Rash, dreams    Social  History:   Social History   Social History  . Marital status: Legally Separated    Spouse name: N/A  . Number of children: N/A  . Years of education: N/A   Occupational History  . Not on file.   Social History Main Topics  . Smoking status: Former Smoker    Packs/day: 1.00    Types: Cigarettes    Quit date: 02/24/1988  . Smokeless tobacco: Never Used  . Alcohol use No  . Drug use: No  . Sexual activity: Not on file   Other Topics Concern  . Not on file   Social History Narrative  . No narrative on file    Family History:    Family History  Problem Relation Age of Onset  . Dementia Mother   . [name] Mother   . Cancer Father   . Sudden Cardiac Death Neg Hx      ROS:  Please see the history of present illness.  ROS  Limited ROS due to lack of pt communication.   Physical Exam/Data:   Vitals:   08/30/17 0425 08/30/17 0745 08/30/17 0901 08/30/17 1158  BP: (!) 135/101  (!) 141/79 131/76  Pulse: 95 99    Resp: (!) 27 (!) 9    Temp:   (!) 97.5 F (36.4 C) (!) 97.3 F (36.3 C)  TempSrc:   Axillary Axillary  SpO2: 96%  100% 98%  Weight:      Height:        Intake/Output Summary (Last 24 hours) at 08/30/17 1502 Last data filed at 08/30/17 1210  Gross per 24 hour  Intake            784.8 ml  Output             2200 ml  Net          -1415.2 ml   Filed Weights   08/27/17 0339 08/28/17 0351 08/30/17 0416  Weight: 271 lb 4.8 oz (123.1 kg) 276 lb 6.4 oz (125.4 kg) 271 lb 2.7 oz (123 kg)   Body mass index is 45.12 kg/m.  General:  Chronically ill appearing, obese, elderly female, in no acute distress HEENT: normal Lymph: no adenopathy Neck: no JVD Endocrine:  No thryomegaly Vascular: No carotid bruits; Faint pedal pulses Cardiac:  normal S1, S2; RRR; no murmur  Lungs:  clear to auscultation bilaterally, crackles in posterior bases Abd: soft, nontender, no hepatomegaly  Ext: 1+ LE edema Skin: warm and dry, lower legs wrapped in Kerlex Neuro:  Pt with lack  of concentration and not answering questions appropriately Psych:  Normal affect   EKG:  The EKG was personally reviewed and demonstrates:  Atrial fibrillation with RVR 127 bpm Telemetry:  Telemetry was personally reviewed and demonstrates:  Atrial fibrillation in the 90's  Relevant CV Studies:  Echo 10/16 INTERPRETATION MILD LV SYSTOLIC DYSFUNCTION (45%) WITH MILD LVH  NORMAL RIGHT VENTRICULAR SYSTOLIC FUNCTION MILD VALVULAR REGURGITATION (See above) NO VALVULAR STENOSIS NO PERICARDIAL EFFUSION Aortic valve sclerosis without stenosis Mitral leaflets are thickened. There is moderate mitral annular calcification but no evidence of significant mitral stenosis. Mild MR. Biatrial enlargement Technically challenging study due to somewhat limited sound transmission   Cardiac Cath: 10/09/14 Impressions: Triple-vessel [name]. RCA and circumflex occlusions appear chronic with decent collaterals. The 1 is a moderate size branch with severe disease, the LAD proper has nonobstructive disease. Anatomy poorly suited for PCI. 2. Elevated LVEDP of 24 mm of mercury 3. Successful closure of our as a arteriotomy with Exoseal device. Absent aortic stenosis.  Recommendations  1. Continue medical therapy 2. Follow-up with Dr. Tami Ribas to discuss further management (medical treatment versus CABG). Lack of significant LAD proper disease likely favors initial medical therapy.   Nuclear Stress 09/25/14 IMPRESSION:  Myocardial perfusion imaging is Abnormal- Large infarct inferior and inferolateral, mild peri-infarct ischemia. Mild reversible perfusion defect anterolateral. Overall ischemic burden to myocardium 10%. Summed severity score is abnormal 28. Artifacts noted: breast and diaphragm. Overall left ventricular systolic function was Abnormal- EF 35% with regional wall motion abnormalities (see above). Compared to the prior study from no prior. Assessment and Plan    Laboratory Data:  Chemistry  Recent  Labs Lab 08/28/17 0306 08/29/17 0138 08/30/17 0715  NA 134* 135 137  K 4.6 4.6 4.6  CL 100* 102 102  CO2 24 23 23   GLUCOSE 114* 152* 129*  BUN 93* 99* 101*  CREATININE 3.19* 3.61* 3.96*  CALCIUM 8.6* 8.8* 8.9  GFRNONAA 14* 12* 11*  GFRAA 16* 14* 13*  ANIONGAP 10 10 12      Recent Labs Lab 08/25/17 1920 08/26/17 0136  08/28/17 0306 08/29/17 0138 08/30/17 0715  PROT 7.5 6.4*  --   --   --   --   ALBUMIN 2.8* 2.4*  < > 2.1* 2.2* 2.1*  AST 56* 39  --   --   --   --   ALT 33 28  --   --   --   --   ALKPHOS 210* 169*  --   --   --   --   BILITOT 1.7* 1.0  --   --   --   --   < > = values in this interval not displayed. Hematology  Recent Labs Lab 08/27/17 0303 08/28/17 0306 08/29/17 0138  WBC 17.8* 16.8* 20.7*  RBC 3.73* 3.70* 3.74*  HGB 11.3* 11.2* 11.4*  HCT 34.4* 34.5* 35.8*  MCV 92.2 93.2 95.7  MCH 30.3 30.3 30.5  MCHC 32.8 32.5 31.8  RDW 14.1 14.5 14.7  PLT 121* 122* 135*   Cardiac Enzymes  Recent Labs Lab 08/26/17 0136 08/26/17 1026 08/27/17 0303 08/29/17 1057  TROPONINI 1.08* 0.91* 0.56* 0.19*     Recent Labs Lab 08/25/17 1940  TROPIPOC 1.22*    BNP  Recent Labs Lab 08/25/17 1920  BNP 396.5*    DDimer No results for input(s): DDIMER in the last 168 hours.  Radiology/Studies:  Dg Chest Port 1 View  Result Date: 08/29/2017 CLINICAL DATA:  Shortness of breath, CHF, diabetes and hypertension EXAM: PORTABLE CHEST 1 VIEW COMPARISON:  08/25/2017 FINDINGS: Stable cardiomegaly with vascular and interstitial prominence diffusely, suspect mild edema. Minor associated basilar atelectasis. No enlarging effusion or pneumothorax. Trachea is midline. Atherosclerosis of the aorta and degenerative changes of the spine. IMPRESSION:  Stable cardiomegaly and mild interstitial edema pattern. Bibasilar atelectasis. Electronically Signed   By: Judie PetitM.  Shick M.D.   On: 08/29/2017 11:06    Assessment and Plan:   Systolic CHF  -[facility] -Home meds include lasix 40 mg daily, Toprol XL 100 mg daily. Continuing on toprol -BNP  396.5 -Echo is pending -Pt received lasix IV yesterday and today. -Wts 264 on admission, up to 276, 271 today after lasix. Pt had 1.3L UOP since yesterday. She is positive 3.1L since admission -Pt appears volume overloaded, edema and crackles in the lung bases -Strict I&O, daily weights and 1800 ml fluid restriction -Continue diuresis and monitor renal function closely.  [name] -cath in 2015 showed Triple-vessel [name]. Chronic RCA and circumflex occlusions with decent collaterals. LAD has non-obstructive disease. Anatomy poorly suited for PCI. -Troponins mildly elevated with peak at 1.08, trending down. Possibly elevated related to CHF and acute kidney injury.  -With patient hx of multi vessel disease, ischemia could also be playing a role.  -Plan for further ischemic workup once more stable. Will discuss with Dr. Katrinka BlazingSmith.   Sepsis secondary to UTI -Treatment per IM, IV antibiotics  Acute respiratory failure -Hypercapnea, improving. Likely multifactorial with possible OSA, pulmonary edema, CHF and pulmonary HTN. Bilateral groundglass opacities seen on her chest CT. CXR: suggests pulmonary edema -Pt was on BiPap, now on nasal cannula as of this AM.   Atrial fibrillation -CHA2DS2/VAS Stroke Risk Score is 6 (CHF, Vasc Dz, HTN, Age, DM, female). She was anticoagulated with Eliquis for stroke risk reduction related to atrial fibrillation. Now on coumadin with heparin bridge.  -On BB, Toprol XL 100 mg for rate control with current rates in the 90s  Hypertension -BP mostly controlled, intermittently elevated.  -No ACE-I/ARB with current renal function  Hyperlipidemia -Treated with Crestor 10 mg daily   Acute on chronic kidney disease -SCr rising 2.70>3.19>3.61>3.96 -Possibly cardiorenal component as well as UTI sepsis. May improve paradoxically with diuresis.   Mitral regurgitation -Mild MR per echo  in 09/2015 -Reassess on echo  Signed, Berton BonJanine Hammond, NP  08/30/2017 3:02 PM

## 2017-08-30 NOTE — Progress Notes (Signed)
RT NOTE:  Unable to collect ABG @ 5am. RN had removed BIPAP to give patient a break and administer breathing treatment. RN will put BIPAP back on after treatment. Will collect ABG after 7am.

## 2017-08-30 NOTE — Progress Notes (Signed)
Family Medicine Teaching Service Daily Progress Note Intern Pager: [phone]  Patient name: Sylvia Craig Medical record number: [id number] Date of birth: [birth year] Age: 66 y.o. Gender: female  Primary Care Provider: Lovena Neighboursiallo, [name], MD Consultants: Nephrology, Cardiology Code Status: Full  Pt Overview and Major Events to Date:  Increase SOB and WOB placed on BiPAP   Assessment and Plan: Sylvia HoveDebbie Craig a 66 y.o.femalewith medical history significant forhypertension, hypothyroidism, hyperlipidemia, [name], insulin-dependent diabetes mellitus, [name], now presenting for evaluation of left shoulder ache, weakness, and UTI diagnosed by her PCP on 8/30.   Assessment/Plan  Sepsis secondary to UTI: Currently patient is on Vanc and Zosyn. WBC elevated to 20.7 on 9/3. Patient is mildly tachycardic with normal BP of 141/79 and temp 97.5. Blood and urine cultures show no growth to date, however she was on abx at collection. Urine culture from 1 wk ago grew pan-sensitive E coli.  -- Continue Zosyn (8/31- ), Zosyn (9/3-) for now, deescalate to rocephin once patient improves clinically -- Follow up on CBC -- Repeat procalcitonin 42.23 > 5.81 on 9/4 -- Repeat ABG improving with 7.262/ 53.4/83.9/2.7  Hypercapnia: improving. Likely multifactorial with possible OSA, pulmonary edema, CHF and pulmonary htn. Bilateral groundglass opacities seen on her chest CT. CXR: suggests pulmonary edema - Continue BiPAP- RT to try and wean her today  Acute on Chronic Renal Failure: CKD, stage 3. SCr is 2.90> 3.96 on (9/4). Initially, AKI thought to be 2/2 to prerenal but did not improved with IVF. Urine studies and renal US showed medical renal disease and no obstruction or hydronephrosis. DDx: ATN, septic AKI, volume overload --Nephrology gave 80 mg IV Lasix once, had output of 1.2 L afterwards- will give another 80 IV Lasix for volume overload --Follow up on am BMP   Elevated troponin,  [name]: On admission troponin elevated to 1.22. Initial presentation with left shoulder pain radiating to left neck concerning for possible angina.Troponins have been down trending throughout admission but above normal with last measured trop at 0.19. There was evidence of [name] on CTA.  --Consulted Cardiology-- question adding ASA daily --Continue cardiac monitoring --Continue metoprolol 100 mg daily --Continue Rosuvastatin 10 mg daily --Continue NTG as needed  Atrial fibrillation with RVR, on heparin drip: Patient is on Eliquis and Toprol at home. On admission patient started on hep drip while being transition to coumadin for anticoagulation given current renal function. Will reassess treatment with Eliquis. --EKG pending --Continue metoprolol 100 mg daily --Continue IV Heparin 1200 units/hr; Daily aPTT, HL, CBC and INR pending- NPO on BiPAP, will transition when off BiPAP.  Chronic diastolic CHF: Appears volume overloaded s/p 1 liter NS in ED. Initial. CXR showed cardiomegaly and low lung volume concerning for  fluid retention. No Echo in file. --Echocardiogram pending --Repeat CXR- Stable cardiomegaly and mild interstitial edema pattern --Continue metoprolol 100 mg daily  --Daily wts and I/O's- 3.1 L positive s/p 80 IV Lasix- 1.2 L total output s/p lasix  T2DM: A1c was 9.8% in July 2018. Home regimen with Lantus 18 units qHS and Humalog 4 TID.  --Continue Lantus 10u and changed to sensitive SSI --continue to monitor CBG q4  Hyponatremia, resolved: On admittion,  Na+127 in setting of hypovolemia. Treated in ED with 1 liter of NS with continued gentle hydration throughout hospitalization. This am Na+ is 137.  --Follow up on daily BMP  Thrombocytopenia : On admission, Platelets were 130k  With a previously normal baseline. This admission nadir at 117 on 8/31, has  since gradually improved to 135 on 9/3. No sign of bleeding or clotting. Currently being transition to coumadin with heparin.   --Will continue to monitor with daily CBC, pending  Leg wounds and developing buttock ulcer: Bilateral LE's with stigmata of chronic venous stasis.There are some wounds bilaterally with serous weeping, maceration, surrounding erythema. Possibly developing cellulitis. Worsening leukocytosis. Patient is currently on Vanc and Zosyn. Patient is afebrile with the rest of her vitals within normal limits.  - Wound care recommend to cleanse buttocks with soap and water and pat dry.  Apply Gerhardt butt cream twice daily. Keep skin clean and dry.  --Started on Vanc 9/3 for broader coverage --Continue wound care     FEN/GI: Carb modified Diet PPx: Heparin drip   Disposition: Likely SNF pending clinical improvement  Subjective: Patient says she does not hurt. She is responsive and looks towards me, says she feels fine.   Objective: Temp:  [97.5 F (36.4 C)-98 F (36.7 C)] 97.5 F (36.4 C) (09/04 0901) Pulse Rate:  [38-107] 99 (09/04 0745) Resp:  [9-27] 9 (09/04 0745) BP: (122-156)/(74-101) 141/79 (09/04 0901) SpO2:  [89 %-100 %] 100 % (09/04 0901) FiO2 (%):  [30 %] 30 % (09/04 0745) Weight:  [271 lb 2.7 oz (123 kg)] 271 lb 2.7 oz (123 kg) (09/04 0416)   Physical Exam: General: Obese woman, in bed, somnolent but in no acute distress, able to answer basic question and to participate in exam Cardiac: RRR, normal heart sounds, no murmurs. 2+ radial and PT pulses bilaterally Respiratory: Bilateral wheezing noted on exam, no increase work of breathing on BiPAP Abdomen: soft, nontender, nondistended, no hepatic or splenomegaly, +BS Extremities:  Bilateral lower extremities lesions weeping serosanguinous fluid, no purulence noted on exam Skin: warm and dry, no rashes noted Neuro: alert and oriented x4, no focal deficits Psych: Normal affect and mood  Laboratory:  Recent Labs Lab 08/27/17 0303 08/28/17 0306 08/29/17 0138  WBC 17.8* 16.8* 20.7*  HGB 11.3* 11.2* 11.4*  HCT 34.4* 34.5*  35.8*  PLT 121* 122* 135*    Recent Labs Lab 08/25/17 1920 08/26/17 0136 08/27/17 0303 08/28/17 0306 08/29/17 0138  NA 127* 129* 133* 134* 135  K 5.1 4.6 4.3 4.6 4.6  CL 93* 97* 100* 100* 102  CO2 21* 21* 22 24 23   BUN 85* 84* 87* 93* 99*  CREATININE 2.90* 2.86* 2.70* 3.19* 3.61*  CALCIUM 9.3 8.7* 8.7* 8.6* 8.8*  PROT 7.5 6.4*  --   --   --   BILITOT 1.7* 1.0  --   --   --   ALKPHOS 210* 169*  --   --   --   ALT 33 28  --   --   --   AST 56* 39  --   --   --   GLUCOSE 172* 173* 65 114* 152*     Imaging/Diagnostic Tests: Dg Chest Port 1 View  Result Date: 08/29/2017 CLINICAL DATA:  Shortness of breath, CHF, diabetes and hypertension EXAM: PORTABLE CHEST 1 VIEW COMPARISON:  08/25/2017 FINDINGS: Stable cardiomegaly with vascular and interstitial prominence diffusely, suspect mild edema. Minor associated basilar atelectasis. No enlarging effusion or pneumothorax. Trachea is midline. Atherosclerosis of the aorta and degenerative changes of the spine. IMPRESSION: Stable cardiomegaly and mild interstitial edema pattern. Bibasilar atelectasis. Electronically Signed   By: Judie Petit.  Shick M.D.   On: 08/29/2017 11:06    Sylvia Craig, [place], DO 08/30/2017, 9:14 AM PGY-1, [facility] FPTS Intern pager: [phone], text pages  welcome

## 2017-08-31 ENCOUNTER — Inpatient Hospital Stay (HOSPITAL_COMMUNITY): Payer: Medicare Other

## 2017-08-31 DIAGNOSIS — J810 Acute pulmonary edema: Secondary | ICD-10-CM

## 2017-08-31 DIAGNOSIS — R0689 Other abnormalities of breathing: Secondary | ICD-10-CM

## 2017-08-31 DIAGNOSIS — R0902 Hypoxemia: Secondary | ICD-10-CM

## 2017-08-31 DIAGNOSIS — I272 Pulmonary hypertension, unspecified: Secondary | ICD-10-CM

## 2017-08-31 DIAGNOSIS — J9621 Acute and chronic respiratory failure with hypoxia: Secondary | ICD-10-CM

## 2017-08-31 DIAGNOSIS — R0602 Shortness of breath: Secondary | ICD-10-CM

## 2017-08-31 LAB — RENAL FUNCTION PANEL
ANION GAP: 14 (ref 5–15)
Albumin: 2.2 g/dL — ABNORMAL LOW (ref 3.5–5.0)
BUN: 106 mg/dL — ABNORMAL HIGH (ref 6–20)
CO2: 23 mmol/L (ref 22–32)
CREATININE: 3.92 mg/dL — AB (ref 0.44–1.00)
Calcium: 8.7 mg/dL — ABNORMAL LOW (ref 8.9–10.3)
Chloride: 104 mmol/L (ref 101–111)
GFR, EST AFRICAN AMERICAN: 13 mL/min — AB (ref >60)
GFR, EST NON AFRICAN AMERICAN: 11 mL/min — AB (ref >60)
Glucose, Bld: 113 mg/dL — ABNORMAL HIGH (ref 65–99)
Phosphorus: 5.6 mg/dL — ABNORMAL HIGH (ref 2.5–4.6)
Potassium: 4.3 mmol/L (ref 3.5–5.1)
SODIUM: 141 mmol/L (ref 135–145)

## 2017-08-31 LAB — CBC
HCT: 38 % (ref 36.0–46.0)
HEMOGLOBIN: 12 g/dL (ref 12.0–15.0)
MCH: 30.1 pg (ref 26.0–34.0)
MCHC: 31.6 g/dL (ref 30.0–36.0)
MCV: 95.2 fL (ref 78.0–100.0)
PLATELETS: 138 10*3/uL — AB (ref 150–400)
RBC: 3.99 MIL/uL (ref 3.87–5.11)
RDW: 14.6 % (ref 11.5–15.5)
WBC: 24.9 10*3/uL — AB (ref 4.0–10.5)

## 2017-08-31 LAB — BLOOD GAS, ARTERIAL
ACID-BASE EXCESS: 1 mmol/L (ref 0.0–2.0)
Bicarbonate: 26.7 mmol/L (ref 20.0–28.0)
DELIVERY SYSTEMS: POSITIVE
Drawn by: 511851
EXPIRATORY PAP: 6
FIO2: 30
INSPIRATORY PAP: 18
O2 SAT: 96.3 %
PCO2 ART: 54.9 mmHg — AB (ref 32.0–48.0)
PH ART: 7.307 — AB (ref 7.350–7.450)
Patient temperature: 98.6
RATE: 14 resp/min
pO2, Arterial: 86.9 mmHg (ref 83.0–108.0)

## 2017-08-31 LAB — VANCOMYCIN, RANDOM: Vancomycin Rm: 16

## 2017-08-31 LAB — PROTIME-INR
INR: 2.18
PROTHROMBIN TIME: 24.1 s — AB (ref 11.4–15.2)

## 2017-08-31 LAB — GLUCOSE, CAPILLARY
GLUCOSE-CAPILLARY: 203 mg/dL — AB (ref 65–99)
GLUCOSE-CAPILLARY: 274 mg/dL — AB (ref 65–99)
Glucose-Capillary: 110 mg/dL — ABNORMAL HIGH (ref 65–99)
Glucose-Capillary: 122 mg/dL — ABNORMAL HIGH (ref 65–99)
Glucose-Capillary: 104 mg/dL — ABNORMAL HIGH (ref 65–99)
Glucose-Capillary: 96 mg/dL (ref 65–99)
Glucose-Capillary: 168 mg/dL — ABNORMAL HIGH (ref 65–99)

## 2017-08-31 LAB — APTT: aPTT: 112 seconds — ABNORMAL HIGH (ref 24–36)

## 2017-08-31 LAB — CULTURE, BLOOD (ROUTINE X 2)
Culture: NO GROWTH
SPECIAL REQUESTS: ADEQUATE

## 2017-08-31 LAB — HEPARIN LEVEL (UNFRACTIONATED): Heparin Unfractionated: 1.08 IU/mL — ABNORMAL HIGH (ref 0.30–0.70)

## 2017-08-31 MED ORDER — LEVOTHYROXINE SODIUM 100 MCG IV SOLR
75.0000 ug | Freq: Every day | INTRAVENOUS | Status: DC
  Administered 2017-08-31: 75 ug via INTRAVENOUS
  Filled 2017-08-31: qty 5

## 2017-08-31 MED ORDER — INSULIN GLARGINE 100 UNIT/ML ~~LOC~~ SOLN
10.0000 [IU] | Freq: Every day | SUBCUTANEOUS | Status: DC
  Administered 2017-09-01 – 2017-09-04 (×4): 10 [IU] via SUBCUTANEOUS
  Filled 2017-08-31 (×4): qty 0.1

## 2017-08-31 MED ORDER — DEXTROSE 5 % IV SOLN
1.0000 g | INTRAVENOUS | Status: DC
  Administered 2017-08-31: 1 g via INTRAVENOUS
  Filled 2017-08-31 (×2): qty 10

## 2017-08-31 MED ORDER — INSULIN ASPART 100 UNIT/ML ~~LOC~~ SOLN
0.0000 [IU] | Freq: Three times a day (TID) | SUBCUTANEOUS | Status: DC
  Administered 2017-09-01 (×2): 2 [IU] via SUBCUTANEOUS
  Administered 2017-09-01: 3 [IU] via SUBCUTANEOUS
  Administered 2017-09-02 (×3): 1 [IU] via SUBCUTANEOUS
  Administered 2017-09-03: 2 [IU] via SUBCUTANEOUS
  Administered 2017-09-03 – 2017-09-04 (×2): 5 [IU] via SUBCUTANEOUS
  Administered 2017-09-04: 2 [IU] via SUBCUTANEOUS
  Administered 2017-09-04 – 2017-09-05 (×4): 3 [IU] via SUBCUTANEOUS

## 2017-08-31 MED ORDER — INSULIN GLARGINE 100 UNIT/ML ~~LOC~~ SOLN: 10.0000 [IU] | Freq: Every day | SUBCUTANEOUS | Status: DC

## 2017-08-31 MED ORDER — METOPROLOL TARTRATE 50 MG PO TABS
50.0000 mg | ORAL_TABLET | Freq: Two times a day (BID) | ORAL | Status: DC
  Administered 2017-09-01 – 2017-09-05 (×10): 50 mg via ORAL
  Filled 2017-08-31 (×10): qty 1

## 2017-08-31 MED ORDER — METOPROLOL TARTRATE 5 MG/5ML IV SOLN
2.5000 mg | Freq: Four times a day (QID) | INTRAVENOUS | Status: DC
  Administered 2017-08-31: 2.5 mg via INTRAVENOUS
  Filled 2017-08-31: qty 5

## 2017-08-31 MED ORDER — INSULIN ASPART 100 UNIT/ML ~~LOC~~ SOLN
0.0000 [IU] | Freq: Every day | SUBCUTANEOUS | Status: DC
  Administered 2017-09-01: 3 [IU] via SUBCUTANEOUS
  Administered 2017-09-01: 2 [IU] via SUBCUTANEOUS
  Administered 2017-09-03 – 2017-09-04 (×2): 3 [IU] via SUBCUTANEOUS

## 2017-08-31 MED ORDER — FUROSEMIDE 10 MG/ML IJ SOLN
80.0000 mg | Freq: Once | INTRAMUSCULAR | Status: AC
  Administered 2017-08-31: 80 mg via INTRAVENOUS
  Filled 2017-08-31 (×2): qty 8

## 2017-08-31 MED ORDER — VANCOMYCIN HCL IN DEXTROSE 1-5 GM/200ML-% IV SOLN
1000.0000 mg | Freq: Once | INTRAVENOUS | Status: DC
  Filled 2017-08-31: qty 200

## 2017-08-31 NOTE — Progress Notes (Signed)
Assessment:  1 CKD 3 2 AKI, hemodynamically mediated due to sepsis, IV contrast dye, afib w/ RVR, low BP, CHF Plan: 1 Supportive 2 Follow renal fct  Subjective: Interval History: Good UOP after lasix yesterday  Objective: Vital signs in last 24 hours: Temp:  [97.3 F (36.3 C)-97.7 F (36.5 C)] 97.5 F (36.4 C) (09/05 0424) Pulse Rate:  [73-103] 73 (09/05 0837) Resp:  [15-30] 21 (09/05 0837) BP: (98-132)/(55-98) 101/61 (09/05 0837) SpO2:  [93 %-100 %] 100 % (09/05 0837) FiO2 (%):  [30 %] 30 % (09/05 0837) Weight:  [122 kg (269 lb)] 122 kg (269 lb) (09/05 0424) Weight change: -0.982 kg (-2 lb 2.7 oz)  Intake/Output from previous day: 09/04 0701 - 09/05 0700 In: 383.1 [I.V.:283.1; IV Piggyback:100] Out: 2700 [Urine:2700] Intake/Output this shift: No intake/output data recorded.  General appearance: alert Head: Normocephalic, without obvious abnormality, atraumatic, BiPAP Resp: clear to auscultation bilaterally Chest wall: no tenderness Extremities: edema 1+  Lab Results:  Recent Labs  08/29/17 0138 08/31/17 0724  WBC 20.7* 24.9*  HGB 11.4* 12.0  HCT 35.8* 38.0  PLT 135* 138*   BMET:  Recent Labs  08/30/17 0715 08/31/17 0724  NA 137 141  K 4.6 4.3  CL 102 104  CO2 23 23  GLUCOSE 129* 113*  BUN 101* 106*  CREATININE 3.96* 3.92*  CALCIUM 8.9 8.7*   No results for input(s): PTH in the last 72 hours. Iron Studies: No results for input(s): IRON, TIBC, TRANSFERRIN, FERRITIN in the last 72 hours. Studies/Results: Dg Chest Port 1 View  Result Date: 08/29/2017 CLINICAL DATA:  Shortness of breath, CHF, diabetes and hypertension EXAM: PORTABLE CHEST 1 VIEW COMPARISON:  08/25/2017 FINDINGS: Stable cardiomegaly with vascular and interstitial prominence diffusely, suspect mild edema. Minor associated basilar atelectasis. No enlarging effusion or pneumothorax. Trachea is midline. Atherosclerosis of the aorta and degenerative changes of the spine. IMPRESSION: Stable  cardiomegaly and mild interstitial edema pattern. Bibasilar atelectasis. Electronically Signed   By: Judie PetitM.  Shick M.D.   On: 08/29/2017 11:06    Scheduled: . cholecalciferol  1,000 Units Oral QHS  . collagenase   Topical Daily  . Ferrous Fumarate  1 tablet Oral BID WC  . Gerhardt's butt cream   Topical BID  . insulin aspart  0-24 Units Subcutaneous Q4H  . insulin glargine  10 Units Subcutaneous QHS  . levothyroxine  137 mcg Oral QAC breakfast  . mouth rinse  15 mL Mouth Rinse BID  . metoprolol succinate  100 mg Oral Daily  . multivitamin with minerals  1 tablet Oral Daily  . rosuvastatin  10 mg Oral q1800  . sodium chloride flush  3 mL Intravenous Q12H  . vitamin C  1,000 mg Oral QHS  . warfarin  5 mg Oral ONCE-1800  . Warfarin - Pharmacist Dosing Inpatient   Does not apply q1800  . zinc sulfate  220 mg Oral Daily     LOS: 6 days   Sylena Lotter C 08/31/2017,10:28 AM

## 2017-08-31 NOTE — Progress Notes (Signed)
Name: Sylvia Craig MRN: [id number] DOB: [birth year]    ADMISSION DATE:  08/25/2017 CONSULTATION DATE:  08/30/17  REFERRING MD :  Pollie Meyer  CHIEF COMPLAINT:  AMS   HISTORY OF PRESENT ILLNESS:    66 year old woman. She has a past medical history of three-vessel coronary artery disease, heart failure with either preserved or recovered EF. The most recent EF is 45% from 2017 from [place]. She additionally atrial fibrillation on Eliquis and mitral regurgitation. Additional pertinent history includes insulin-dependent diabetes. She has had recent outpatient prescriptions for ciprofloxacin and Keflex. Recent left hand complicated wound that required surgical debridement and is followed by plastic surgery. She presented to the emergency department 4 days ago and was admitted to the medical ward. According to the history and physical on admission she had complained about weakness and falls as well as dull ache in her left shoulder rating up into the left neck. At that time should been on ciprofloxacin for one day for a UTI diagnosed outpatient. She was admitted for presumed sepsis secondary to urinary tract infection complicated by A. fib RVR and acute kidney injury. She was treated with ceftriaxone for which her recent outpatient urinary culture was sensitive. Additionally her presentation she had an elevated troponin of 1.2 to repeat of 1.08 and is now trended down 0.19. Per the medical record cardiology was contacted by the emergency department but has not formally consulted.While her EKGs clearly did not illustrate ST segment elevations I feel that there are significant ST segment changes in leads V5 and V6 as well as possibly in lead 23 and aVF. Not clear if these represent a nonischemic features such as rate related changes at the QRS to somewhat widened and may be rate related bundle branch block. She had a CT of her chest on admission without lobar infiltrate but with bilateral groundglass opacities,  cardiomegaly coronary arteriosclerosis aortic atherosclerosis dilated pulmonary artery. Urine culture and blood culture been negative to date. Her acute kidney injury is worsened and she was seen by nephrology that recommended supportive care and furosemide 1. Initial pro-calcitonin was 42 which is consistent with severe sepsis and her urinalysis showed leukocyte esterase. Her antibiotic regimen was changed to Zosyn. Today she was more somnolent and an ABG was obtained which showed acute hypercarbic respiratory failure she was placed on BiPAP and repeat ABG showed improvement of her hypercapnia. Her ABG also supports a component of non-anion gap metabolic acidosis. At the time my exam she is sleepy, denies shortness of breath chest pain nausea vomiting abdominal pain.   SUBJECTIVE:  Feeling better.  Some intermittent SOB. Wears 2L [place] at home.   VITAL SIGNS: Temp:  [97.5 F (36.4 C)-97.7 F (36.5 C)] 97.6 F (36.4 C) (09/05 1100) Pulse Rate:  [73-103] 80 (09/05 1237) Resp:  [15-30] 18 (09/05 1237) BP: (98-132)/(55-98) 131/92 (09/05 1237) SpO2:  [93 %-100 %] 96 % (09/05 1237) FiO2 (%):  [28 %-30 %] 28 % (09/05 1237) Weight:  [122 kg (269 lb)] 122 kg (269 lb) (09/05 0424)  PHYSICAL EXAMINATION: General:  Obese, chronically ill appearing, older than stated age Neuro:  Drowsy but easily arousable, appropriate, MAE  HEENT: EOMI ATNC PERRLA pale conjunctiva Cardiovascular:  Regular rate and rhythm, no murmur delayed capillary refill bilateral lower extremity edema Lungs: resps even non labored on 2L [place], diminished bases, coarse crackles throughout  Abdomen:  Protuberant positive bowel sounds soft nontender nondistended  Musculoskeletal:  No red warm swollen tender deformed joints Skin:  L wrist dressing c/d,  scant BLE edema, chronic BLE wounds   Recent Labs Lab 08/29/17 0138 08/30/17 0715 08/31/17 0724  NA 135 137 141  K 4.6 4.6 4.3  CL 102 102 104  CO2 23 23 23   BUN 99* 101* 106*    CREATININE 3.61* 3.96* 3.92*  GLUCOSE 152* 129* 113*    Recent Labs Lab 08/28/17 0306 08/29/17 0138 08/31/17 0724  HGB 11.2* 11.4* 12.0  HCT 34.5* 35.8* 38.0  WBC 16.8* 20.7* 24.9*  PLT 122* 135* 138*   Dg Chest Port 1 View  Result Date: 08/31/2017 CLINICAL DATA:  Hypercapnia EXAM: PORTABLE CHEST 1 VIEW COMPARISON:  08/29/2017 FINDINGS: Cardiomegaly with vascular congestion. Mild interstitial edema pattern, improved since prior study. No confluent opacities or effusions. IMPRESSION: Improving interstitial edema pattern. Electronically Signed   By: Charlett NoseKevin  Dover M.D.   On: 08/31/2017 11:34    ASSESSMENT / PLAN:  Acute on chronic hypercarbic and hypoxic respiratory failure - multifactorial in setting sepsis r/t UTI (improving), decompensated OSA and acute on chronic heart failure, AFib with RVR.  Still with pulmonary edema on CXR but appears improved, on baseline O2 requirement.  Diuresis c/b acute on CKD.   REC-  Aggressive diuresis as renal function will allow - renal following.  bipap qhs and PRN  Repeat echo pending  abx per primary for UTI  F/u CXR  Pulmonary hygiene  Mobilize as able  HR control    Dirk DressKaty Whiteheart, NP 08/31/2017  3:33 PM Pager: [phone] or [phone]  Attending Note:  66 year old female with extensive PMH who is O2 dependent at home at 2L [place] [facility] hypoxemic respiratory failure that is chronic but worsening with interstitial pulmonary edema.  PCCM was called originally and patient was not a candidate for ICU transfer and improved with BiPAP and diureses and cardiology consult were recommended.  Patient decreased down to 2L [place] but concern for work of breathing and PCCM was consulted again for respiratory failure.  On exam, crackles noted.  I reviewed CXR from today and again shows interstitial edema.  Echo noted EF of 45% with pulmonary hypertension.  Discussed with PCCM-NP and resident.  Respiratory failure: appears  comfortable on 2L [place] which is the patient's home dose of oxygen.  - Continue O2 at 2L   - Target sat of 90% not 88 given pulmonary HTN  - BiPAP only as needed.  OSA: refused CPAP in the past  - Recommend CPAP at night which will alleviate daytime symptoms.  Pulmonary edema:  - Diureses as renal function allows.  - If renal function deteriorates further may need input from renal service  CHF:  - Cards consulted.  Little else for PCCM to do here, will sign off, please call back if needed, final recommendations as above, please feel free to call if condition deteriorates.  Patient seen and examined, agree with above note.  I dictated the care and orders written for this patient under my direction.  Alyson ReedyYacoub, Wesam G, MD [phone]

## 2017-08-31 NOTE — Progress Notes (Signed)
Intensavist up to see patient and given some current info re: VS, labs and condition of patient at this time, will need to monitor her closer for any changes in VS or mental deterioration and call MD back as needed, for now will remain on 3W on Bipap in stable but more critical condition.

## 2017-08-31 NOTE — Progress Notes (Signed)
ANTICOAGULATION CONSULT NOTE - Follow Up Consult  Pharmacy Consult for Heparin and Coumadin Indication: atrial fibrillation  Allergies  Allergen Reactions  . Glucosamine Shortness Of Breath  . Shellfish-Derived Products Shortness Of Breath  . Codeine Other (See Comments)    Rash, edema  . Atorvastatin Other (See Comments)    Myalgias   . Lisinopril Other (See Comments)    Cough   . Other Other (See Comments)    Crab, chest tightness  . Oxycodone-Aspirin Other (See Comments)    Rash, dreams    Patient Measurements: Height: 5\' 5"  (165.1 cm) Weight: 269 lb (122 kg) IBW/kg (Calculated) : 57 Heparin Dosing Weight: 88 kg  Vital Signs: Temp: 97.5 F (36.4 C) (09/05 0424) Temp Source: Axillary (09/05 0424) BP: 121/79 (09/05 1100) Pulse Rate: 90 (09/05 1100)  Labs:  Recent Labs  08/29/17 0138  08/29/17 1057 08/29/17 2107 08/30/17 0715 08/30/17 1217 08/31/17 0724  HGB 11.4*  --   --   --   --   --  12.0  HCT 35.8*  --   --   --   --   --  38.0  PLT 135*  --   --   --   --   --  138*  APTT 142*  < >  --  80*  --  109* 112*  LABPROT 20.5*  --   --   --   --  19.2* 24.1*  INR 1.77  --   --   --   --  1.63 2.18  HEPARINUNFRC 2.20*  --   --   --   --  1.52* 1.08*  CREATININE 3.61*  --   --   --  3.96*  --  3.92*  TROPONINI  --   --  0.19*  --   --   --   --   < > = values in this interval not displayed.  Estimated Creatinine Clearance: 18.2 mL/min (A) (by C-G formula based on SCr of 3.92 mg/dL (H)).  Assessment:  66 y.o. female with a history of Afib on Eliquis (last dose was 8/30). Due to AKI she is now on heparin and coumadin. Using aPTTs for heparin monitoring since heparin levels are skewed due to recent Eliquis doses.  aPTT is 112 seconds on 1100 units/hr. Just above goal.  INR up to 2.18, despite no Coumadin given on 9/3 or 9/4 while on BiPAP.  Hgb stable, platelet count low stable.  Goal of Therapy:  INR 2-3 Heparin level 0.3-0.7 units/ml aPTT 66-102  seconds Monitor platelets by anticoagulation protocol: Yes   Plan:   Decrease heparin drip to 950 units/hr.  No Coumadin again today since on BiPAP.  Daily aPTT, heparin level, PT/INR and CBC.  Dennie FettersEgan, Arianie Couse [place], [place] Pager: [phone] 08/31/2017,11:36 AM

## 2017-08-31 NOTE — Progress Notes (Signed)
Family Medicine Teaching Service Daily Progress Note Intern Pager: [phone]  Patient name: Sylvia Craig Medical record number: [id number] Date of birth: 1951/12/27 Age: 66 y.o. Gender: female  Primary Care Provider: Lovena Neighboursiallo, [name], MD Consultants: Nephrology, Cardiology Code Status: Full  Pt Overview and Major Events to Date:  Increase SOB and WOB placed on BiPAP   Assessment and Plan: Sylvia HoveDebbie Dunnis a 66 y.o.femalewith medical history significant forhypertension, hypothyroidism, hyperlipidemia, [name], insulin-dependent diabetes mellitus, [name], now presenting for evaluation of left shoulder ache, weakness, and UTI diagnosed by her PCP on 8/30.   Assessment/Plan  Sepsis secondary to UTI: Currently patient is on Vanc and Zosyn. WBC elevated to 24.9 on 9/5. Patient has normal heart rate with soft BP of 101/61 and temp 97.5. Blood and urine cultures show no growth to date, however she was on abx at collection. Urine culture from 1 wk ago grew pan-sensitive E coli.  -- Continue Zosyn (8/31- ), Vanc (9/3-) for now, deescalate to rocephin once patient improves clinically- pharmacy dosing vanc, not receiving dose today, vanc trough reveals she is at therapeutic level -- Follow up on CBC: WBC 18.6 > 24.9 9/05 -- Repeat procalcitonin 42.23 > 5.81 on 9/4, repeat on 9/6 -- Repeat ABG this am shows improvement after BiPAP with 7.307/54.9/86.9/1.0  Hypercapnia: Likely multifactorial with possible OSA, pulmonary edema, CHF and pulmonary htn. Bilateral groundglass opacities seen on her chest CT. CXR: suggests pulmonary edema - Patient on BiPAP, s/p trial to [place] - Repeat CXR: Cardiomegaly with vascular congestion. Mild interstitial edema pattern, improved since prior study. No confluent opacities or effusions.  Acute on Chronic Renal Failure: CKD, stage 3. SCr is 2.90> 3.92 on (9/5). Initially, AKI thought to be 2/2 to prerenal but did not improved with IVF. Urine studies  and renal US showed medical renal disease and no obstruction or hydronephrosis. DDx: ATN, septic AKI, volume overload --S/p 2 doses of 80 mg IV Lasix for volume overload- down 2.3 L total now -- Monitor for uremia due to increasing BUN, patient less responsive this morning - Will consult pulmonology for continued BiPAP   Elevated troponin, [name]: On admission troponin elevated to 1.22. Troponins have been down trending throughout admission but above normal with last measured trop at 0.19. There was evidence of [name] on CTA.  --Consulted Cardiology-- risk factor modifications: LDL < 70, tight glycemic control, BP control <130/90 mmHg, using bb therapy and ccb therapy. Appropriate to add aspirin 81 mg. PPI if aspirin is used- not candidate for invasive strategy --Continue cardiac monitoring --Continue metoprolol 100 mg daily, Continue Rosuvastatin 10 mg daily --Continue NTG as needed  Atrial fibrillation with RVR, on heparin drip: Patient is on Eliquis and Toprol at home. On admission patient started on hep drip while being transitioned to coumadin for anticoagulation given current renal function, however, on BiPAP. Will reassess treatment with Eliquis. --EKG pending --Continue metoprolol 100 mg daily --Continue IV Heparin 1100 units/hr; Daily aPTT, HL, CBC and INR pending- NPO on BiPAP, will transition to coumadin when off BiPAP.  Chronic diastolic CHF: Weight on admission 264> 276 on 9/2 > 269 on 9/5 . Initial CXR showed cardiomegaly and low lung volume concerning for fluid retention. ECHO shows EF 45-50%, Mitral valve: Severely calcified annulus. Thickening and calcification of the mitral valve leaflets. Systolic pressure was severely increased. PA peak pressure: 62 mm Hg  --Daily wts and I/O's-  s/p 80 IV Lasix x2- 2.3 L total output   T2DM: A1c was 9.8% in  July 2018. Home regimen with Lantus 18 units qHS and Humalog 4 TID.  --Continue Lantus 10u and on sensitive SSI- received 4 U  yesterday --continue to monitor CBG q4  Hyponatremia, resolved: On admittion,  Na+127 in setting of hypovolemia. Treated in ED with 1 liter of NS with continued gentle hydration throughout hospitalization. This am Na+ is 141.  --Follow up on daily BMP  Thrombocytopenia : On admission, Platelets were 130k  With a previously normal baseline. This admission nadir at 117 on 8/31, has since gradually improved to 138 on 9/5. No sign of bleeding or clotting. Currently on heparin with hopes to transition to coumadin s/p BiPAP  --Will continue to monitor with daily CBC  Leg wounds and developing buttock ulcer: Bilateral LE's with stigmata of chronic venous stasis.There are some wounds bilaterally with serous weeping, maceration, surrounding erythema. Possibly developing cellulitis. Worsening leukocytosis. Patient is currently on Vanc and Zosyn. Patient is afebrile with the rest of her vitals within normal limits.  - Wound care recommend to cleanse buttocks with soap and water and pat dry.  Apply Gerhardt butt cream twice daily. Keep skin clean and dry.  --Started on Vanc 9/3 for broader coverage --Continue wound care     Hypothyroidism: chronic. On 137 mcg of synthroid daily - Will switch to 75 mcg IV while patient on BiPAP, change to po once patient no longer NPO  FEN/GI: Carb modified Diet, NPO on BiPAP PPx: Heparin drip   Disposition: Likely SNF pending clinical improvement  Subjective: Patient more somnolent this am and less responsive. On BiPAP.  Spoke with her son over the phone, Sylvia Craig. He reports that this is not her baseline and she is normally very with it. He reports that she fell on Monday 08/22/2017 due to her legs getting weak. He then picked her up soon after and placed her on the couch. She reportedly fell 2 more times after falling off he couch and was helped up by her son and then EMT had to be called to assist with getting her off the couch. At this point the patient felt  unsafe to go to the bathroom on her own so her son called the ambulance to come her. He says she has been very active at home and would lie to do anything he can to see her get back to baseline.   Objective: Temp:  [97.3 F (36.3 C)-97.7 F (36.5 C)] 97.5 F (36.4 C) (09/05 0424) Pulse Rate:  [73-103] 90 (09/05 1100) Resp:  [15-30] 16 (09/05 1100) BP: (98-132)/(55-98) 121/79 (09/05 1100) SpO2:  [93 %-100 %] 100 % (09/05 1100) FiO2 (%):  [30 %] 30 % (09/05 0837) Weight:  [269 lb (122 kg)] 269 lb (122 kg) (09/05 0424)   Physical Exam: General: Obese woman, in bed, somnolent but in no acute distress, less responsive and unable to participate in exam Cardiac: RRR, normal heart sounds, no murmurs. 2+ radial and PT pulses bilaterally Respiratory: Bilateral wheezing noted on exam, no increase work of breathing on BiPAP Abdomen: soft, nontender, nondistended, no hepatic or splenomegaly, +BS Extremities:  Bilateral lower extremities lesions weeping serosanguinous fluid, no purulence noted on exam, 1+ pitting edema noted- improved volume status from previous exam Skin: warm and dry, no rashes noted Neuro: alert and oriented x4, no focal deficits Psych: Normal affect and mood  Laboratory:  Recent Labs Lab 08/28/17 0306 08/29/17 0138 08/31/17 0724  WBC 16.8* 20.7* 24.9*  HGB 11.2* 11.4* 12.0  HCT 34.5* 35.8* 38.0  PLT 122* 135* 138*    Recent Labs Lab 08/25/17 1920 08/26/17 0136  08/29/17 0138 08/30/17 0715 08/31/17 0724  NA 127* 129*  < > 135 137 141  K 5.1 4.6  < > 4.6 4.6 4.3  CL 93* 97*  < > 102 102 104  CO2 21* 21*  < > 23 23 23   BUN 85* 84*  < > 99* 101* 106*  CREATININE 2.90* 2.86*  < > 3.61* 3.96* 3.92*  CALCIUM 9.3 8.7*  < > 8.8* 8.9 8.7*  PROT 7.5 6.4*  --   --   --   --   BILITOT 1.7* 1.0  --   --   --   --   ALKPHOS 210* 169*  --   --   --   --   ALT 33 28  --   --   --   --   AST 56* 39  --   --   --   --   GLUCOSE 172* 173*  < > 152* 129* 113*  < > = values  in this interval not displayed.   Imaging/Diagnostic Tests: Dg Chest Port 1 View  Result Date: 08/31/2017 CLINICAL DATA:  Hypercapnia EXAM: PORTABLE CHEST 1 VIEW COMPARISON:  08/29/2017 FINDINGS: Cardiomegaly with vascular congestion. Mild interstitial edema pattern, improved since prior study. No confluent opacities or effusions. IMPRESSION: Improving interstitial edema pattern. Electronically Signed   By: Charlett Nose M.D.   On: 08/31/2017 11:34     ECHO 9/5 Study Conclusions  - Left ventricle: The cavity size was moderately dilated. There was   mild concentric hypertrophy. Systolic function was mildly   reduced. The estimated ejection fraction was in the range of 45%   to 50%. Wall motion was normal; there were no regional wall   motion abnormalities. - Aortic valve: Transvalvular velocity was within the normal range.   There was no stenosis. There was no regurgitation. - Mitral valve: Severely calcified annulus. Thickening and   calcification of the mitral valve leaflets. Mobility was   restricted. Transvalvular velocity was within the normal range.   There was no evidence for stenosis. There was trivial   regurgitation. - Right ventricle: The cavity size was normal. Wall thickness was   normal. Systolic function was normal. - Tricuspid valve: There was mild regurgitation. - Pulmonary arteries: Systolic pressure was severely increased. PA   peak pressure: 62 mm Hg (S). - Pericardium, extracardiac: There was evidence for increased RV-LV   interaction demonstrated by respirophasic changes in tricuspid   velocities.  Nakayla Rorabaugh, [place], DO 08/31/2017, 11:41 AM PGY-1, [facility] FPTS Intern pager: [phone], text pages welcome

## 2017-08-31 NOTE — Progress Notes (Signed)
IM Resident up to see patient and was given updated info re: her condition, VS and mental status. Pt has been following simple commands and answers simple questions appropriately, will continue to monitor. Patient remains on Bipap with O2 at 30%.

## 2017-08-31 NOTE — Progress Notes (Signed)
Patient ready to come off bipap. Placed patient on 2L [place]. Sats are 98% and vitals are stable. RT will continue to monitor.

## 2017-08-31 NOTE — Progress Notes (Signed)
Progress Note  Patient Name: Sylvia Craig Date of Encounter: 08/31/2017  Primary Cardiologist: [facility]oss[facility]  Subjective   Patient is awake and conversant today. Denies chest pain. Moderate shortness of breath.  Inpatient Medications    Scheduled Meds: . cholecalciferol  1,000 Units Oral QHS  . collagenase   Topical Daily  . Ferrous Fumarate  1 tablet Oral BID WC  . Gerhardt's butt cream   Topical BID  . insulin aspart  0-24 Units Subcutaneous Q4H  . levothyroxine  75 mcg Intravenous Daily  . mouth rinse  15 mL Mouth Rinse BID  . metoprolol tartrate  2.5 mg Intravenous Q6H  . multivitamin with minerals  1 tablet Oral Daily  . rosuvastatin  10 mg Oral q1800  . sodium chloride flush  3 mL Intravenous Q12H  . vitamin C  1,000 mg Oral QHS  . Warfarin - Pharmacist Dosing Inpatient   Does not apply q1800  . zinc sulfate  220 mg Oral Daily   Continuous Infusions: . cefTRIAXone (ROCEPHIN)  IV    . heparin 950 Units/hr (08/31/17 1136)   PRN Meds: acetaminophen **OR** acetaminophen, HYDROcodone-acetaminophen, levalbuterol, nitroGLYCERIN, ondansetron **OR** ondansetron (ZOFRAN) IV, senna-docusate   Vital Signs    Vitals:   08/31/17 0837 08/31/17 1100 08/31/17 1237 08/31/17 1700  BP: 101/61 121/79 (!) 131/92   Pulse: 73 90 80   Resp: (!) 21 16 18 20   Temp:  97.6 F (36.4 C)  97.9 F (36.6 C)  TempSrc:  Axillary  Oral  SpO2: 100% 100% 96%   Weight:      Height:        Intake/Output Summary (Last 24 hours) at 08/31/17 1942 Last data filed at 08/31/17 1800  Gross per 24 hour  Intake            533.4 ml  Output             2100 ml  Net          -1566.6 ml   Filed Weights   08/28/17 0351 08/30/17 0416 08/31/17 0424  Weight: 276 lb 6.4 oz (125.4 kg) 271 lb 2.7 oz (123 kg) 269 lb (122 kg)    Telemetry    Atrial fibrillation with controlled rate - Personally Reviewed  ECG    Atrial fibrillation with slow ventricular response. No acute ST-T wave change. - Personally  Reviewed  Physical Exam  Awake, skin color is pink. GEN: No acute distress.   Neck: No JVD Cardiac: RRR, no murmurs, rubs, or gallops.  Respiratory:  rhonchi are heard on inspiration.. Breath sounds are diminished at the base. GI: Soft, nontender, non-distended  MS: No edema; No deformity. Neuro:  Nonfocal  Psych: Normal affect   Labs    Chemistry Recent Labs Lab 08/25/17 1920 08/26/17 0136  08/29/17 0138 08/30/17 0715 08/31/17 0724  NA 127* 129*  < > 135 137 141  K 5.1 4.6  < > 4.6 4.6 4.3  CL 93* 97*  < > 102 102 104  CO2 21* 21*  < > 23 23 23   GLUCOSE 172* 173*  < > 152* 129* 113*  BUN 85* 84*  < > 99* 101* 106*  CREATININE 2.90* 2.86*  < > 3.61* 3.96* 3.92*  CALCIUM 9.3 8.7*  < > 8.8* 8.9 8.7*  PROT 7.5 6.4*  --   --   --   --   ALBUMIN 2.8* 2.4*  < > 2.2* 2.1* 2.2*  AST 56* 39  --   --   --   --  ALT 33 28  --   --   --   --   ALKPHOS 210* 169*  --   --   --   --   BILITOT 1.7* 1.0  --   --   --   --   GFRNONAA 16* 16*  < > 12* 11* 11*  GFRAA 18* 19*  < > 14* 13* 13*  ANIONGAP 13 11  < > 10 12 14   < > = values in this interval not displayed.   Hematology Recent Labs Lab 08/28/17 0306 08/29/17 0138 08/31/17 0724  WBC 16.8* 20.7* 24.9*  RBC 3.70* 3.74* 3.99  HGB 11.2* 11.4* 12.0  HCT 34.5* 35.8* 38.0  MCV 93.2 95.7 95.2  MCH 30.3 30.5 30.1  MCHC 32.5 31.8 31.6  RDW 14.5 14.7 14.6  PLT 122* 135* 138*    Cardiac Enzymes Recent Labs Lab 08/26/17 0136 08/26/17 1026 08/27/17 0303 08/29/17 1057  TROPONINI 1.08* 0.91* 0.56* 0.19*    Recent Labs Lab 08/25/17 1940  TROPIPOC 1.22*     BNP Recent Labs Lab 08/25/17 1920  BNP 396.5*     DDimer No results for input(s): DDIMER in the last 168 hours.   Radiology    Dg Chest Port 1 View  Result Date: 08/31/2017 CLINICAL DATA:  Hypercapnia EXAM: PORTABLE CHEST 1 VIEW COMPARISON:  08/29/2017 FINDINGS: Cardiomegaly with vascular congestion. Mild interstitial edema pattern, improved since prior  study. No confluent opacities or effusions. IMPRESSION: Improving interstitial edema pattern. Electronically Signed   By: Charlett Nose M.D.   On: 08/31/2017 11:34    Cardiac Studies   2-D Doppler echocardiogram 08/30/2017: ------------------------------------------------------------------- Study Conclusions  - Left ventricle: The cavity size was moderately dilated. There was   mild concentric hypertrophy. Systolic function was mildly   reduced. The estimated ejection fraction was in the range of 45%   to 50%. Wall motion was normal; there were no regional wall   motion abnormalities. - Aortic valve: Transvalvular velocity was within the normal range.   There was no stenosis. There was no regurgitation. - Mitral valve: Severely calcified annulus. Thickening and   calcification of the mitral valve leaflets. Mobility was   restricted. Transvalvular velocity was within the normal range.   There was no evidence for stenosis. There was trivial   regurgitation. - Right ventricle: The cavity size was normal. Wall thickness was   normal. Systolic function was normal. - Tricuspid valve: There was mild regurgitation. - Pulmonary arteries: Systolic pressure was severely increased. PA   peak pressure: 62 mm Hg (S). - Pericardium, extracardiac: There was evidence for increased RV-LV   interaction demonstrated by respirophasic changes in tricuspid   velocities.    Patient Profile     66 y.o. female with a hx of HTN, hypothyroidism, HLD, [name], IDDM, heart failure with preserved EF [name] who is being seen today for the evaluation of CHF and elevated troponin at the request of Dr. Pollie Meyer.  Assessment & Plan    1. Acute on chronic combined systolic and diastolic heart failure: Diuresis as tolerated by blood pressure and kidney function. Echocardiogram reveals relatively stable LV function compared with historical. Relatively preserved systolic function arteries  against acute ischemic cardiac event contributing to overall clinical presentation. 2. Elevated troponin, likely demand ischemia in the setting of heart failure and respiratory failure. No plan for ischemic evaluation at this point. 3. Coronary artery disease with prior documentation of occluded circumflex and right coronary with widely patent  LAD.  Signed, Lesleigh Noe, MD  08/31/2017, 7:42 PM

## 2017-08-31 NOTE — Plan of Care (Signed)
Problem: Safety: Goal: Ability to remain free from injury will improve Outcome: Progressing Patient needs much encouragement and more frequent rounding to make sure of her safety. Patient is somewhat alert but UTA her comprehension and true understanding of materials being taught, will continue to monitor. Patient has bed alarm on throughout the shift to help keep her safe and personal items placed on bedside stand within her reach.

## 2017-08-31 NOTE — Progress Notes (Addendum)
Pharmacy Antibiotic Note  Sylvia Craig is a 66 y.o. female admitted on day #56 Zosyn and day #3 Vancomycin for sepsis/UTI and wound coverage.  AKI, random Vanc level today is 16 mcg/ml. At threshold for re-dosing. Afebrile, WBC up to 24.9, cultures negative to date.  Plan:  Vancomycin 1 gm IV x 1 today.  Continue Zosyn 2.25 gm IV q6hrs.  Will follow renal function, final culture data and clinical progress.  Noted plan to de-escalate to Ceftriaxone once clinically improved.  Will follow up antibiotic plans; recheck random Vanc level in 2-3 days if Vanc to continue.  Target Vanc troughs 15-20 mcg/ml.  2:55 pm addendum:  Vanc and Zosyn now changing to Ceftriaxone alone for UTI coverage. Last Zosyn dose just given ~2pm.   Will begin Ceftriaxone 1gm IV q24h at 8pm tonight.   Height: 5\' 5"  (165.1 cm) Weight: 269 lb (122 kg) IBW/kg (Calculated) : 57  Temp (24hrs), Avg:97.5 F (36.4 C), Min:97.3 F (36.3 C), Max:97.7 F (36.5 C)   Recent Labs Lab 08/26/17 0135 08/26/17 0136 08/27/17 0303 08/28/17 0306 08/29/17 0138 08/29/17 1909 08/30/17 0715 08/31/17 0724  WBC  --  20.4* 17.8* 16.8* 20.7*  --   --  24.9*  CREATININE  --  2.86* 2.70* 3.19* 3.61*  --  3.96* 3.92*  LATICACIDVEN 1.5  --   --   --   --  1.1  --   --   VANCORANDOM  --   --   --   --   --   --   --  16    Estimated Creatinine Clearance: 18.2 mL/min (A) (by C-G formula based on SCr of 3.92 mg/dL (H)).    Allergies  Allergen Reactions  . Glucosamine Shortness Of Breath  . Shellfish-Derived Products Shortness Of Breath  . Codeine Other (See Comments)    Rash, edema  . Atorvastatin Other (See Comments)    Myalgias   . Lisinopril Other (See Comments)    Cough   . Other Other (See Comments)    Crab, chest tightness  . Oxycodone-Aspirin Other (See Comments)    Rash, dreams    Antimicrobials this admission: CTX x 1 8/30 Zosyn 8/31>> Vancomycin 9/3>>  Dose adjustments this admission:  no standing vanc regimen  due to AKI - intermittent doses based on levels.  9/5: random vanc level = 16 mcg/ml -> re-dose with Vanc 1gm IV x 1  Microbiology results: 8/31 Blood culture x 2 - ng x 4 days to date 8/31 Mrsa pcr - negtive 8/31 Urine cx - negative  Thank you for allowing pharmacy to be a part of this patient's care.  Dennie Fettersgan, Curran Lenderman Donovan, [place] Pager: [phone] 08/31/2017 11:41 AM

## 2017-08-31 NOTE — Progress Notes (Signed)
Subjective/Chief Complaint: Patient seen for follow up following placement of Acell to left hand in OR several weeks ago. Missed follow up appointment due to being hospitalized.  Left hand Acell remains in place and wound is gradually healing. No signs of infection currently.    Objective: Vital signs in last 24 hours: Temp:  [97.5 F (36.4 C)-97.7 F (36.5 C)] 97.6 F (36.4 C) (09/05 1100) Pulse Rate:  [73-103] 80 (09/05 1237) Resp:  [15-30] 18 (09/05 1237) BP: (98-132)/(55-98) 131/92 (09/05 1237) SpO2:  [93 %-100 %] 96 % (09/05 1237) FiO2 (%):  [28 %-30 %] 28 % (09/05 1237) Weight:  [122 kg (269 lb)] 122 kg (269 lb) (09/05 0424) Last BM Date: 08/31/17  Intake/Output from previous day: 09/04 0701 - 09/05 0700 In: 383.1 [I.V.:283.1; IV Piggyback:100] Out: 2700 [Urine:2700] Intake/Output this shift: Total I/O In: 240 [P.O.:240] Out: -   General appearance: no distress, slowed mentation and confused at times.  left hand with Acell in place and partially incorporated. no signs of infection.   Lab Results:   Recent Labs  08/29/17 0138 08/31/17 0724  WBC 20.7* 24.9*  HGB 11.4* 12.0  HCT 35.8* 38.0  PLT 135* 138*   BMET  Recent Labs  08/30/17 0715 08/31/17 0724  NA 137 141  K 4.6 4.3  CL 102 104  CO2 23 23  GLUCOSE 129* 113*  BUN 101* 106*  CREATININE 3.96* 3.92*  CALCIUM 8.9 8.7*   PT/INR  Recent Labs  08/30/17 1217 08/31/17 0724  LABPROT 19.2* 24.1*  INR 1.63 2.18   ABG  Recent Labs  08/30/17 1650 08/31/17 1008  PHART 7.243* 7.307*  HCO3 25.0 26.7    Studies/Results: Dg Chest Port 1 View  Result Date: 08/31/2017 CLINICAL DATA:  Hypercapnia EXAM: PORTABLE CHEST 1 VIEW COMPARISON:  08/29/2017 FINDINGS: Cardiomegaly with vascular congestion. Mild interstitial edema pattern, improved since prior study. No confluent opacities or effusions. IMPRESSION: Improving interstitial edema pattern. Electronically Signed   By: Charlett NoseKevin  Dover M.D.   On:  08/31/2017 11:34    Anti-infectives: Anti-infectives    Start     Dose/Rate Route Frequency Ordered Stop   08/31/17 2000  cefTRIAXone (ROCEPHIN) 1 g in dextrose 5 % 50 mL IVPB     1 g 100 mL/hr over 30 Minutes Intravenous Every 24 hours 08/31/17 1455     08/31/17 1500  vancomycin (VANCOCIN) 2,500 mg in sodium chloride 0.9 % 500 mL IVPB  Status:  Discontinued     2,500 mg 250 mL/hr over 120 Minutes Intravenous  Once 08/29/17 1434 08/30/17 0759   08/31/17 1400  vancomycin (VANCOCIN) IVPB 1000 mg/200 mL premix  Status:  Discontinued     1,000 mg 200 mL/hr over 60 Minutes Intravenous  Once 08/31/17 1135 08/31/17 1439   08/30/17 1430  piperacillin-tazobactam (ZOSYN) IVPB 2.25 g  Status:  Discontinued     2.25 g 100 mL/hr over 30 Minutes Intravenous Every 6 hours 08/30/17 1131 08/31/17 1439   08/29/17 1500  vancomycin (VANCOCIN) 1,750 mg in sodium chloride 0.9 % 500 mL IVPB  Status:  Discontinued     1,750 mg 250 mL/hr over 120 Minutes Intravenous Every 48 hours 08/29/17 1434 08/30/17 1131   08/26/17 1800  cefTRIAXone (ROCEPHIN) 1 g in dextrose 5 % 50 mL IVPB  Status:  Discontinued     1 g 100 mL/hr over 30 Minutes Intravenous Every 24 hours 08/25/17 2337 08/26/17 0954   08/26/17 1700  piperacillin-tazobactam (ZOSYN) IVPB 3.375 g  Status:  Discontinued     3.375 g 12.5 mL/hr over 240 Minutes Intravenous Every 8 hours 08/26/17 0958 08/30/17 1131   08/26/17 1000  piperacillin-tazobactam (ZOSYN) IVPB 3.375 g     3.375 g 100 mL/hr over 30 Minutes Intravenous  Once 08/26/17 0958 08/26/17 1300   08/25/17 2300  cefTRIAXone (ROCEPHIN) 2 g in dextrose 5 % 50 mL IVPB  Status:  Discontinued     2 g 100 mL/hr over 30 Minutes Intravenous  Once 08/25/17 2249 08/25/17 2336   08/25/17 2200  cefTRIAXone (ROCEPHIN) 1 g in dextrose 5 % 50 mL IVPB     1 g 100 mL/hr over 30 Minutes Intravenous  Once 08/25/17 2154 08/25/17 2326      Assessment/Plan: s/p * No surgery found * Daily dressing changes to the  left hand with surgical lubricant      LOS: 6 days    Litisha Guagliardo,PA-C Plastic Surgery [phone]

## 2017-08-31 NOTE — Progress Notes (Signed)
Report received in patient's room via Erie NoeVanessa RN using SBAR format, reviewed orders, labs, VS, tests and patient's general condition and worsening mental status, awaiting ABG results, may transfer to ICU for closer monitoring.

## 2017-09-01 ENCOUNTER — Inpatient Hospital Stay (HOSPITAL_COMMUNITY): Payer: Medicare Other

## 2017-09-01 ENCOUNTER — Ambulatory Visit: Payer: Medicare Other | Admitting: Occupational Therapy

## 2017-09-01 ENCOUNTER — Telehealth: Payer: Self-pay | Admitting: Occupational Therapy

## 2017-09-01 DIAGNOSIS — S81809A Unspecified open wound, unspecified lower leg, initial encounter: Secondary | ICD-10-CM

## 2017-09-01 LAB — BLOOD GAS, ARTERIAL
ACID-BASE EXCESS: 1 mmol/L (ref 0.0–2.0)
Bicarbonate: 26.1 mmol/L (ref 20.0–28.0)
Drawn by: 51185
O2 CONTENT: 2 L/min
O2 Saturation: 98.1 %
PCO2 ART: 49.3 mmHg — AB (ref 32.0–48.0)
PH ART: 7.343 — AB (ref 7.350–7.450)
PO2 ART: 103 mmHg (ref 83.0–108.0)
Patient temperature: 98.6

## 2017-09-01 LAB — PROTIME-INR
INR: 2.5
Prothrombin Time: 26.8 seconds — ABNORMAL HIGH (ref 11.4–15.2)

## 2017-09-01 LAB — GLUCOSE, CAPILLARY
GLUCOSE-CAPILLARY: 174 mg/dL — AB (ref 65–99)
GLUCOSE-CAPILLARY: 205 mg/dL — AB (ref 65–99)
GLUCOSE-CAPILLARY: 157 mg/dL — AB (ref 65–99)
GLUCOSE-CAPILLARY: 249 mg/dL — AB (ref 65–99)
Glucose-Capillary: 157 mg/dL — ABNORMAL HIGH (ref 65–99)
Glucose-Capillary: 243 mg/dL — ABNORMAL HIGH (ref 65–99)
Glucose-Capillary: 270 mg/dL — ABNORMAL HIGH (ref 65–99)

## 2017-09-01 LAB — CBC
HEMATOCRIT: 37.1 % (ref 36.0–46.0)
HEMOGLOBIN: 11.8 g/dL — AB (ref 12.0–15.0)
MCH: 30.3 pg (ref 26.0–34.0)
MCHC: 31.8 g/dL (ref 30.0–36.0)
MCV: 95.1 fL (ref 78.0–100.0)
Platelets: 153 10*3/uL (ref 150–400)
RBC: 3.9 MIL/uL (ref 3.87–5.11)
RDW: 14.9 % (ref 11.5–15.5)
WBC: 23.6 10*3/uL — AB (ref 4.0–10.5)

## 2017-09-01 LAB — RENAL FUNCTION PANEL
Albumin: 2.2 g/dL — ABNORMAL LOW (ref 3.5–5.0)
Anion gap: 15 (ref 5–15)
BUN: 111 mg/dL — AB (ref 6–20)
CALCIUM: 9 mg/dL (ref 8.9–10.3)
CO2: 27 mmol/L (ref 22–32)
CREATININE: 3.85 mg/dL — AB (ref 0.44–1.00)
Chloride: 102 mmol/L (ref 101–111)
GFR calc Af Amer: 13 mL/min — ABNORMAL LOW (ref >60)
GFR, EST NON AFRICAN AMERICAN: 11 mL/min — AB (ref >60)
GLUCOSE: 144 mg/dL — AB (ref 65–99)
PHOSPHORUS: 3.6 mg/dL (ref 2.5–4.6)
POTASSIUM: 4.1 mmol/L (ref 3.5–5.1)
SODIUM: 144 mmol/L (ref 135–145)

## 2017-09-01 LAB — CULTURE, BLOOD (ROUTINE X 2)
Culture: NO GROWTH
Special Requests: ADEQUATE

## 2017-09-01 LAB — PROCALCITONIN: Procalcitonin: 2.94 ng/mL

## 2017-09-01 LAB — HEPARIN LEVEL (UNFRACTIONATED): Heparin Unfractionated: 0.75 IU/mL — ABNORMAL HIGH (ref 0.30–0.70)

## 2017-09-01 LAB — APTT: aPTT: 87 seconds — ABNORMAL HIGH (ref 24–36)

## 2017-09-01 MED ORDER — CEPHALEXIN 500 MG PO CAPS
500.0000 mg | ORAL_CAPSULE | Freq: Two times a day (BID) | ORAL | Status: DC
  Administered 2017-09-01 – 2017-09-02 (×2): 500 mg via ORAL
  Filled 2017-09-01 (×2): qty 1

## 2017-09-01 MED ORDER — FUROSEMIDE 10 MG/ML IJ SOLN
80.0000 mg | Freq: Once | INTRAMUSCULAR | Status: AC
  Administered 2017-09-01: 80 mg via INTRAVENOUS
  Filled 2017-09-01: qty 8

## 2017-09-01 MED ORDER — LEVOTHYROXINE SODIUM 25 MCG PO TABS
137.0000 ug | ORAL_TABLET | Freq: Every day | ORAL | Status: DC
  Administered 2017-09-01 – 2017-09-05 (×5): 137 ug via ORAL
  Filled 2017-09-01 (×5): qty 1

## 2017-09-01 MED ORDER — CEPHALEXIN 250 MG PO CAPS
250.0000 mg | ORAL_CAPSULE | Freq: Every day | ORAL | Status: DC
  Filled 2017-09-01: qty 1

## 2017-09-01 MED ORDER — FUROSEMIDE 10 MG/ML IJ SOLN
INTRAMUSCULAR | Status: AC
  Filled 2017-09-01: qty 4

## 2017-09-01 MED ORDER — WARFARIN SODIUM 2.5 MG PO TABS
2.5000 mg | ORAL_TABLET | Freq: Once | ORAL | Status: AC
  Administered 2017-09-01: 2.5 mg via ORAL
  Filled 2017-09-01: qty 1

## 2017-09-01 NOTE — Progress Notes (Signed)
Family Medicine Teaching Service Daily Progress Note Intern Pager: [phone]  Patient name: Sylvia Craig Medical record number: [id number] Date of birth: [birth year] Age: 66 y.o. Gender: female  Primary Care Provider: [name], MD Consultants: Nephrology, Cardiology Code Status: Full  Pt Overview and Major Events to Date:  Increase SOB and WOB placed on BiPAP   Assessment and Plan: Sylvia Craig a 66 y.o.femalewith medical history significant forhypertension, hypothyroidism, hyperlipidemia, [name], insulin-dependent diabetes mellitus, [name], now presenting for evaluation of left shoulder ache, weakness, and UTI diagnosed by her PCP on 8/30.   Assessment/Plan  Sepsis secondary to UTI: WBC elevated to 23.6 on 9/6. Patient has normal heart rate with BP of 124/59 and temp 97.5. Blood and urine cultures show no growth to date.Urine culture from 1 wk ago grew pan-sensitive E coli.  -- Zosyn (8/31-9/5 ), Vanc (9/3-9/5); deescalated to Rocephin on 9/5 and d/c -- Place on Keflex 250 mg daily given GFR of 11 ending on 9/14 for a 14 day course -- Follow up on CBC: WBC 18.6 > 24.9> 23.6 on 9/6 -- Repeat procalcitonin 42.23 > 5.81> 2.94 on 9/6 -- Repeat ABG 9/5 shows improvement after BiPAP with 7.343/49.3 -- PT/OT consulted for evaluation   Hypercapnia: Improving. Likely multifactorial with possible OSA, pulmonary edema, CHF and pulmonary htn.  - Patient on 2L [place] during day, which is home oxygen requirement, on BiPAP- will place on CPAP qHs - Repeat CXR 9/5: Cardiomegaly with vascular congestion. Mild interstitial edema pattern, improved since prior study. No confluent opacities or effusions.  Acute on Chronic Renal Failure, improving: CKD, stage 3. SCr is 2.90> 3.96 > 3.85 on 9/6. Urine studies and renal US showed medical renal disease and no obstruction or hydronephrosis. DDx: ATN, septic AKI, volume overload- now euvolemic  --S/p 3 doses of 80 mg IV Lasix for  volume overload- down 2.3 L total now, only 700 mL output s/p most recent lasix 9/5. Consider returning to home dosing of Lasix of 40 mEq PO -- Monitor for uremia due to increasing BUN 111 on 9/6   Elevated troponin, [name]: Last measured trop at 0.19. There was evidence of [name] on CTA. Likely demand ischemia with CHF and respiratory failure.  --Consulted Cardiology-- No plan for ischemic evaluation at this point. --Continue cardiac monitoring --Continue metoprolol 100 mg daily, Continue Rosuvastatin 10 mg daily --Continue NTG as needed -- ABI's to be done today  Atrial fibrillation with RVR, on heparin drip: Patient is on Eliquis and Toprol at home. On admission patient started on hep drip while being transitioned to coumadin for anticoagulation given current renal function. Will reassess treatment with Eliquis. --EKG shows patient not in Afib --Continue metoprolol 100 mg daily --Continue IV Heparin for transition to coumadin per pharmacy.  Acute on chronic systolic and diastolic HF: Weight on admission 264> 276 on 9/2 > 268 on 9/6 . Initial CXR showed cardiomegaly and low lung volume concerning for fluid retention. ECHO shows EF 45-50%, Systolic pressure was severely increased. PA peak pressure: 62 mm Hg  --Daily wts and I/O's-  s/p 80 IV Lasix x2- 2.3 L total output, 2 unmeasured outputs - Continue to diurese with 80 IV Lasix - will continue home lasix dosage now that she is eating  T2DM: A1c was 9.8% in July 2018. Home regimen with Lantus 18 units qHS and Humalog 4 TID.  --Held Lantus 10u on 9/5 due to NPO status and CBG's in the 100's on sensitive SSI- received 19 U yesterday,  will resume 10 U while patient is eating --continue to monitor CBG q4  Thrombocytopenia, resolved: On admission, Platelets were 130k  With a previously normal baseline. This admission nadir at 117 on 8/31, has since gradually improved to 153 on 9/6. No sign of bleeding or clotting.  --Will continue to monitor with  daily CBC - Transition to coumadin  Leg wounds and developing buttock ulcer: Bilateral LE's with stigmata of chronic venous stasis.There are some wounds bilaterally with serous weeping, maceration, surrounding erythema. Possibly developing cellulitis. Worsening leukocytosis. Patient is currently on Rocephin. Patient is afebrile with the rest of her vitals within normal limits.  - Wound care recommend to cleanse buttocks with soap and water and pat dry.  Apply Gerhardt butt cream twice daily. Keep skin clean and dry.  --Continue wound care    -- Seen by plastic surgery for left hand wound- continue wound care  Hypothyroidism: chronic. On 137 mcg of synthroid daily - Continue 137 mcg of synthroid daily, given patient no longer NPO  FEN/GI: Carb modified Diet, NPO on BiPAP PPx: Heparin drip   Disposition: Likely SNF pending clinical improvement  Subjective: Patient is much improved today. Saying she feels better and wants to get well to go home to her grandson. She is able to participate in the exam and answers questions appropriately. Says she is thirsty and is drinking on her own.   Objective: Temp:  [97.5 F (36.4 C)-97.9 F (36.6 C)] 97.9 F (36.6 C) (09/06 1100) Pulse Rate:  [80-101] 85 (09/06 1100) Resp:  [18-23] 19 (09/06 1100) BP: (115-134)/(59-92) 115/60 (09/06 1100) SpO2:  [96 %-100 %] 99 % (09/06 1100) FiO2 (%):  [28 %-30 %] 30 % (09/06 0450) Weight:  [268 lb 1.6 oz (121.6 kg)] 268 lb 1.6 oz (121.6 kg) (09/06 0427)   Physical Exam: General: Obese woman, in bed, NAD, responsive and able to participate in exam Cardiac: irregular rhythm, normal heart sounds, no murmurs Respiratory: No wheezing noted on exam, no increase work of breathing on 2L [place] Abdomen: soft, nontender, nondistended, +BS Extremities:  Bilateral lower extremities wrapped, L hand wrapped, 1+ pitting edema noted on RLE Skin: warm and dry, no rashes noted Neuro: alert and oriented x4, no focal  deficits Psych: Normal affect and mood  Laboratory:  Recent Labs Lab 08/29/17 0138 08/31/17 0724 09/01/17 0529  WBC 20.7* 24.9* 23.6*  HGB 11.4* 12.0 11.8*  HCT 35.8* 38.0 37.1  PLT 135* 138* 153    Recent Labs Lab 08/25/17 1920 08/26/17 0136  08/30/17 0715 08/31/17 0724 09/01/17 0529  NA 127* 129*  < > 137 141 144  K 5.1 4.6  < > 4.6 4.3 4.1  CL 93* 97*  < > 102 104 102  CO2 21* 21*  < > 23 23 27   BUN 85* 84*  < > 101* 106* 111*  CREATININE 2.90* 2.86*  < > 3.96* 3.92* 3.85*  CALCIUM 9.3 8.7*  < > 8.9 8.7* 9.0  PROT 7.5 6.4*  --   --   --   --   BILITOT 1.7* 1.0  --   --   --   --   ALKPHOS 210* 169*  --   --   --   --   ALT 33 28  --   --   --   --   AST 56* 39  --   --   --   --   GLUCOSE 172* 173*  < > 129* 113* 144*  < > =  values in this interval not displayed.   Imaging/Diagnostic Tests: No results found. ECHO 9/5 Study Conclusions  - Left ventricle: The cavity size was moderately dilated. There was   mild concentric hypertrophy. Systolic function was mildly   reduced. The estimated ejection fraction was in the range of 45%   to 50%. Wall motion was normal; there were no regional wall   motion abnormalities. - Aortic valve: Transvalvular velocity was within the normal range.   There was no stenosis. There was no regurgitation. - Mitral valve: Severely calcified annulus. Thickening and   calcification of the mitral valve leaflets. Mobility was   restricted. Transvalvular velocity was within the normal range.   There was no evidence for stenosis. There was trivial   regurgitation. - Right ventricle: The cavity size was normal. Wall thickness was   normal. Systolic function was normal. - Tricuspid valve: There was mild regurgitation. - Pulmonary arteries: Systolic pressure was severely increased. PA   peak pressure: 62 mm Hg (S). - Pericardium, extracardiac: There was evidence for increased RV-LV   interaction demonstrated by respirophasic changes in  tricuspid   velocities.  Ayo Smoak, [place], DO 09/01/2017, 11:36 AM PGY-1, [facility] FPTS Intern pager: [phone], text pages welcome

## 2017-09-01 NOTE — Progress Notes (Signed)
Assessment:  1 CKD 3 2 AKI, hemodynamically mediated  Plan: 1 Supportive 2 Follow renal fct  Subjective: Interval History: better  Objective: Vital signs in last 24 hours: Temp:  [97.5 F (36.4 C)-97.9 F (36.6 C)] 97.9 F (36.6 C) (09/06 1100) Pulse Rate:  [85-101] 85 (09/06 1100) Resp:  [18-23] 19 (09/06 1100) BP: (115-134)/(59-85) 115/60 (09/06 1100) SpO2:  [99 %-100 %] 99 % (09/06 1100) FiO2 (%):  [30 %] 30 % (09/06 0450) Weight:  [121.6 kg (268 lb 1.6 oz)] 121.6 kg (268 lb 1.6 oz) (09/06 0427) Weight change: -0.408 kg (-14.4 oz)  Intake/Output from previous day: 09/05 0701 - 09/06 0700 In: 726.9 [P.O.:480; I.V.:196.9; IV Piggyback:50] Out: 700 [Urine:700] Intake/Output this shift: No intake/output data recorded.  General appearance: alert and cooperative Resp: clear to auscultation bilaterally Cor RRR abd protub  Lab Results:  Recent Labs  08/31/17 0724 09/01/17 0529  WBC 24.9* 23.6*  HGB 12.0 11.8*  HCT 38.0 37.1  PLT 138* 153   BMET:  Recent Labs  08/31/17 0724 09/01/17 0529  NA 141 144  K 4.3 4.1  CL 104 102  CO2 23 27  GLUCOSE 113* 144*  BUN 106* 111*  CREATININE 3.92* 3.85*  CALCIUM 8.7* 9.0   No results for input(s): PTH in the last 72 hours. Iron Studies: No results for input(s): IRON, TIBC, TRANSFERRIN, FERRITIN in the last 72 hours. Studies/Results: Dg Chest Port 1 View  Result Date: 08/31/2017 CLINICAL DATA:  Hypercapnia EXAM: PORTABLE CHEST 1 VIEW COMPARISON:  08/29/2017 FINDINGS: Cardiomegaly with vascular congestion. Mild interstitial edema pattern, improved since prior study. No confluent opacities or effusions. IMPRESSION: Improving interstitial edema pattern. Electronically Signed   By: Charlett NoseKevin  Dover M.D.   On: 08/31/2017 11:34   Scheduled: . cephALEXin  500 mg Oral Q12H  . cholecalciferol  1,000 Units Oral QHS  . collagenase   Topical Daily  . Ferrous Fumarate  1 tablet Oral BID WC  . furosemide  80 mg Intravenous Once   . Gerhardt's butt cream   Topical BID  . insulin aspart  0-5 Units Subcutaneous QHS  . insulin aspart  0-9 Units Subcutaneous TID WC  . insulin glargine  10 Units Subcutaneous QHS  . levothyroxine  137 mcg Oral QAC breakfast  . mouth rinse  15 mL Mouth Rinse BID  . metoprolol tartrate  50 mg Oral BID  . multivitamin with minerals  1 tablet Oral Daily  . rosuvastatin  10 mg Oral q1800  . sodium chloride flush  3 mL Intravenous Q12H  . vitamin C  1,000 mg Oral QHS  . warfarin  2.5 mg Oral ONCE-1800  . Warfarin - Pharmacist Dosing Inpatient   Does not apply q1800  . zinc sulfate  220 mg Oral Daily     LOS: 7 days   Anubis Fundora C 09/01/2017,1:12 PM

## 2017-09-01 NOTE — Progress Notes (Signed)
Progress Note  Patient Name: Sylvia Craig Date of Encounter: 09/01/2017  Primary Cardiologist: Elbe like her breathing is better today.   Inpatient Medications    Scheduled Meds: . cephALEXin  250 mg Oral Daily  . cholecalciferol  1,000 Units Oral QHS  . collagenase   Topical Daily  . Ferrous Fumarate  1 tablet Oral BID WC  . furosemide  80 mg Intravenous Once  . Gerhardt's butt cream   Topical BID  . insulin aspart  0-5 Units Subcutaneous QHS  . insulin aspart  0-9 Units Subcutaneous TID WC  . insulin glargine  10 Units Subcutaneous QHS  . levothyroxine  137 mcg Oral QAC breakfast  . mouth rinse  15 mL Mouth Rinse BID  . metoprolol tartrate  50 mg Oral BID  . multivitamin with minerals  1 tablet Oral Daily  . rosuvastatin  10 mg Oral q1800  . sodium chloride flush  3 mL Intravenous Q12H  . vitamin C  1,000 mg Oral QHS  . warfarin  2.5 mg Oral ONCE-1800  . Warfarin - Pharmacist Dosing Inpatient   Does not apply q1800  . zinc sulfate  220 mg Oral Daily   Continuous Infusions: . heparin 950 Units/hr (09/01/17 0346)   PRN Meds: acetaminophen **OR** acetaminophen, HYDROcodone-acetaminophen, levalbuterol, nitroGLYCERIN, ondansetron **OR** ondansetron (ZOFRAN) IV, senna-docusate   Vital Signs    Vitals:   09/01/17 0450 09/01/17 0720 09/01/17 0805 09/01/17 1100  BP: 116/70 (!) 124/59 120/72 115/60  Pulse: 87 92 96 85  Resp: (!) 23 (!) 23 (!) 22 19  Temp:  (!) 97.5 F (36.4 C)  97.9 F (36.6 C)  TempSrc:  Oral  Oral  SpO2: 100% 100% 100% 99%  Weight:      Height:        Intake/Output Summary (Last 24 hours) at 09/01/17 1149 Last data filed at 09/01/17 0300  Gross per 24 hour  Intake            726.9 ml  Output              700 ml  Net             26.9 ml   Filed Weights   08/30/17 0416 08/31/17 0424 09/01/17 0427  Weight: 271 lb 2.7 oz (123 kg) 269 lb (122 kg) 268 lb 1.6 oz (121.6 kg)    Telemetry    Afib rate controlled - Personally  Reviewed  ECG    N/a - Personally Reviewed  Physical Exam   General: Obese WF, wearing [place] _0 , no distress noted.  Head: Normocephalic, atraumatic.  Neck: Supple but difficult to assess JVD. Lungs:  Resp regular and unlabored, Diminished, mild rhonchi. Heart: Irreg Irreg, S1, S2, no S3, S4, or murmur; no rub. Abdomen: Soft, non-tender, non-distended with normoactive bowel sounds.  Extremities: No clubbing, cyanosis, no edema. Chronic LE wounds Neuro: Alert and oriented X 3. Moves all extremities spontaneously. Psych: Normal affect.  Labs    Chemistry Recent Labs Lab 08/25/17 1920 08/26/17 0136  08/30/17 0715 08/31/17 0724 09/01/17 0529  NA 127* 129*  < > 137 141 144  K 5.1 4.6  < > 4.6 4.3 4.1  CL 93* 97*  < > 102 104 102  CO2 21* 21*  < > _1 GLUCOSE 172* 173*  < > 129* 113* 144*  BUN 85* 84*  < > 101* 106* 111*  CREATININE 2.90* 2.86*  < > 3.96*  3.92* 3.85*  CALCIUM 9.3 8.7*  < > 8.9 8.7* 9.0  PROT 7.5 6.4*  --   --   --   --   ALBUMIN 2.8* 2.4*  < > 2.1* 2.2* 2.2*  AST 56* 39  --   --   --   --   ALT 33 28  --   --   --   --   ALKPHOS 210* 169*  --   --   --   --   BILITOT 1.7* 1.0  --   --   --   --   GFRNONAA 16* 16*  < > 11* 11* 11*  GFRAA 18* 19*  < > 13* 13* 13*  ANIONGAP 13 11  < > _0 < > = values in this interval not displayed.   Hematology Recent Labs Lab 08/29/17 0138 08/31/17 0724 09/01/17 0529  WBC 20.7* 24.9* 23.6*  RBC 3.74* 3.99 3.90  HGB 11.4* 12.0 11.8*  HCT 35.8* 38.0 37.1  MCV 95.7 95.2 95.1  MCH 30.5 30.1 30.3  MCHC 31.8 31.6 31.8  RDW 14.7 14.6 14.9  PLT 135* 138* 153    Cardiac Enzymes Recent Labs Lab 08/26/17 0136 08/26/17 1026 08/27/17 0303 08/29/17 1057  TROPONINI 1.08* 0.91* 0.56* 0.19*    Recent Labs Lab 08/25/17 1940  TROPIPOC 1.22*     BNP Recent Labs Lab 08/25/17 1920  BNP 396.5*     DDimer No results for input(s): DDIMER in the last 168 hours.    Radiology    Dg Chest Port 1  View  Result Date: 08/31/2017 CLINICAL DATA:  Hypercapnia EXAM: PORTABLE CHEST 1 VIEW COMPARISON:  08/29/2017 FINDINGS: Cardiomegaly with vascular congestion. Mild interstitial edema pattern, improved since prior study. No confluent opacities or effusions. IMPRESSION: Improving interstitial edema pattern. Electronically Signed   By: Rolm Baptise M.D.   On: 08/31/2017 11:34    Cardiac Studies   2-D Doppler echocardiogram 08/30/2017: ------------------------------------------------------------------- Study Conclusions  - Left ventricle: The cavity size was moderately dilated. There was mild concentric hypertrophy. Systolic function was mildly reduced. The estimated ejection fraction was in the range of 45% to 50%. Wall motion was normal; there were no regional wall motion abnormalities. - Aortic valve: Transvalvular velocity was within the normal range. There was no stenosis. There was no regurgitation. - Mitral valve: Severely calcified annulus. Thickening and calcification of the mitral valve leaflets. Mobility was restricted. Transvalvular velocity was within the normal range. There was no evidence for stenosis. There was trivial regurgitation. - Right ventricle: The cavity size was normal. Wall thickness was normal. Systolic function was normal. - Tricuspid valve: There was mild regurgitation. - Pulmonary arteries: Systolic pressure was severely increased. PA peak pressure: 62 mm Hg (S). - Pericardium, extracardiac: There was evidence for increased RV-LV interaction demonstrated by respirophasic changes in tricuspid velocities.  Patient Profile     66 y.o. female with a hx of HTN, hypothyroidism, HLD, [name], IDDM, heart failure with preserved EF and atrial fibrillation on Eliquiswho presented for the evaluation of CHF and elevated troponin  Assessment & Plan    1. Acute on chronic combines HF: UOP 700cc noted yesterday, but weight is trending down. Echo  showed stable LV function when compared to previous. Still appears volume overloaded on exam. Would give additional IV lasix today as blood pressure allows. Cr stable if not mildly improved today.   2. Elevated Troponin: Suspect demand ischemia 2/2 acute illness and respiratory failure. No reported anginal  symptoms.   3. [name]: Known occluded Lcx and RCA with patent LAD. Not a candidate for invasive therapy at this time.   4. CKD with AKI: Cr has been trending up, mildly improved today at 3.85. Follow closely with diuresis.    Signed, Reino Bellis, NP  09/01/2017, 11:50 AM  Pager # [phone]   The patient has been seen in conjunction with Reino Bellis, NP-C. All aspects of care have been considered and discussed. The patient has been personally interviewed, examined, and all clinical data has been reviewed.   On 2 successive days, the patient has been awake on rounds and able to communicate in conversation. She was obtunded the first, met her 3 days ago tablet because of CO2 narcosis.  Reasonable diuresis.  Kidney function remains a significant problem and is contributing to volume overload.  Continue diuresis as long as kidney function remains relatively stable.

## 2017-09-01 NOTE — Telephone Encounter (Signed)
OT called pt's cell phone and spoke to son who answered as pt is in hospital and not feeling well/communicating well.  Recommended cancelling next weeks outpatient rehab OT/PT appts as pt reports that he is unsure when pt will be d/c'd and that it is possible that she will have therapy at another venue. Son agreed.  Son made aware that OT/PT will need MD note clearing her to return to therapy after hospitalization.  Son verbalized understanding and reports that either pt or he will call and cancel remaining appointments if pt unable to return to therapy.

## 2017-09-01 NOTE — Progress Notes (Signed)
ANTICOAGULATION CONSULT NOTE - Follow Up Consult  Pharmacy Consult for Heparin and Coumadin Indication: atrial fibrillation  Allergies  Allergen Reactions  . Glucosamine Shortness Of Breath  . Shellfish-Derived Products Shortness Of Breath  . Codeine Other (See Comments)    Rash, edema  . Atorvastatin Other (See Comments)    Myalgias   . Lisinopril Other (See Comments)    Cough   . Other Other (See Comments)    Crab, chest tightness  . Oxycodone-Aspirin Other (See Comments)    Rash, dreams    Patient Measurements: Height: 5\' 5"  (165.1 cm) Weight: 268 lb 1.6 oz (121.6 kg) IBW/kg (Calculated) : 57 Heparin Dosing Weight: 88 kg  Vital Signs: Temp: 97.5 F (36.4 C) (09/06 0720) Temp Source: Oral (09/06 0720) BP: 124/59 (09/06 0720) Pulse Rate: 92 (09/06 0720)  Labs:  Recent Labs  08/29/17 1057  08/30/17 0715 08/30/17 1217 08/31/17 0724 09/01/17 0529  HGB  --   --   --   --  12.0 11.8*  HCT  --   --   --   --  38.0 37.1  PLT  --   --   --   --  138* 153  APTT  --   < >  --  109* 112* 87*  LABPROT  --   --   --  19.2* 24.1* 26.8*  INR  --   --   --  1.63 2.18 2.50  HEPARINUNFRC  --   --   --  1.52* 1.08* 0.75*  CREATININE  --   --  3.96*  --  3.92* 3.85*  TROPONINI 0.19*  --   --   --   --   --   < > = values in this interval not displayed.  Estimated Creatinine Clearance: 18.5 mL/min (A) (by C-G formula based on SCr of 3.85 mg/dL (H)).  Assessment:  66 y.o. female with a history of Afib on Eliquis (last dose was 8/30). Due to AKI she is now on heparin and coumadin. Using aPTTs for heparin monitoring since heparin levels are skewed due to recent Eliquis doses. INR up to 2.5, despite no Coumadin given on 9/3 or 9/4 while on BiPAP. LFTs WNL, tbili= 1.0 possible impact of recent antibiotics.  -heparin level is 0.75 and trending down, aPTT is 87  Only two doses of coumadin given thus far (9/2 and 9/5). INR of 2.5 reflects coumadin given on 9/2 in addition to a  residual effect of apixaban. Would not anticipate an increasing INR due to apixaban at this point.  It will be difficult to assess when a true therapeutic INR has been obtained.  Goal of Therapy:  INR 2-3 Heparin level 0.3-0.7 units/ml aPTT 66-102 seconds Monitor platelets by anticoagulation protocol: Yes   Plan:  -Coumadin 2.5mg  po today -Continue heparin at 950 units/hr (please note only two doses of coumadin have been given) -Daily heparin level, CBC, heparin level and aPTT  Harland GermanAndrew Vernard Gram, Pharm D 09/01/2017 8:39 AM

## 2017-09-01 NOTE — Progress Notes (Signed)
VASCULAR LAB PRELIMINARY  ARTERIAL  ABI completed:  Right ABI non compressible. Left ABI indicates mild arterial disease, but may be falsely elevated. Bilateral TBI abnormal: Right 0.58, Left 0.25.    RIGHT    LEFT    PRESSURE WAVEFORM  PRESSURE WAVEFORM  BRACHIAL 136 Tri BRACHIAL 122 Tri  DP   DP    AT 255 Damp mono AT 91 Damp mono  PT Unable to obtain  PT 116 Damp mono  PER   PER    GREAT TOE  NA GREAT TOE  NA    RIGHT LEFT  ABI Non compressible 0.85       Farrel DemarkJill Eunice, RDMS, RVT  09/01/2017, 1:18 PM

## 2017-09-02 LAB — GLUCOSE, CAPILLARY
GLUCOSE-CAPILLARY: 133 mg/dL — AB (ref 65–99)
GLUCOSE-CAPILLARY: 145 mg/dL — AB (ref 65–99)
Glucose-Capillary: 121 mg/dL — ABNORMAL HIGH (ref 65–99)
Glucose-Capillary: 141 mg/dL — ABNORMAL HIGH (ref 65–99)

## 2017-09-02 LAB — APTT: APTT: 120 s — AB (ref 24–36)

## 2017-09-02 LAB — CBC
HEMATOCRIT: 37.1 % (ref 36.0–46.0)
Hemoglobin: 11.6 g/dL — ABNORMAL LOW (ref 12.0–15.0)
MCH: 29.8 pg (ref 26.0–34.0)
MCHC: 31.3 g/dL (ref 30.0–36.0)
MCV: 95.4 fL (ref 78.0–100.0)
Platelets: 151 10*3/uL (ref 150–400)
RBC: 3.89 MIL/uL (ref 3.87–5.11)
RDW: 14.7 % (ref 11.5–15.5)
WBC: 22.6 10*3/uL — ABNORMAL HIGH (ref 4.0–10.5)

## 2017-09-02 LAB — PROTIME-INR
INR: 3.17
Prothrombin Time: 32.3 seconds — ABNORMAL HIGH (ref 11.4–15.2)

## 2017-09-02 LAB — RENAL FUNCTION PANEL
ANION GAP: 11 (ref 5–15)
Albumin: 2.3 g/dL — ABNORMAL LOW (ref 3.5–5.0)
BUN: 107 mg/dL — AB (ref 6–20)
CO2: 29 mmol/L (ref 22–32)
Calcium: 9.1 mg/dL (ref 8.9–10.3)
Chloride: 100 mmol/L — ABNORMAL LOW (ref 101–111)
Creatinine, Ser: 3.79 mg/dL — ABNORMAL HIGH (ref 0.44–1.00)
GFR calc Af Amer: 13 mL/min — ABNORMAL LOW (ref >60)
GFR calc non Af Amer: 11 mL/min — ABNORMAL LOW (ref >60)
GLUCOSE: 165 mg/dL — AB (ref 65–99)
POTASSIUM: 4.2 mmol/L (ref 3.5–5.1)
Phosphorus: 4 mg/dL (ref 2.5–4.6)
Sodium: 140 mmol/L (ref 135–145)

## 2017-09-02 LAB — HEPARIN LEVEL (UNFRACTIONATED): Heparin Unfractionated: 0.65 IU/mL (ref 0.30–0.70)

## 2017-09-02 LAB — TROPONIN I
TROPONIN I: 0.1 ng/mL — AB (ref <0.03)
TROPONIN I: 0.07 ng/mL — AB (ref <0.03)
Troponin I: 0.09 ng/mL (ref <0.03)

## 2017-09-02 MED ORDER — CEPHALEXIN 250 MG PO CAPS
250.0000 mg | ORAL_CAPSULE | Freq: Two times a day (BID) | ORAL | Status: DC
  Administered 2017-09-02 – 2017-09-05 (×6): 250 mg via ORAL
  Filled 2017-09-02 (×7): qty 1

## 2017-09-02 MED ORDER — FUROSEMIDE 40 MG PO TABS
40.0000 mg | ORAL_TABLET | Freq: Every day | ORAL | Status: DC
  Administered 2017-09-02 – 2017-09-05 (×4): 40 mg via ORAL
  Filled 2017-09-02 (×4): qty 1

## 2017-09-02 NOTE — Progress Notes (Signed)
Assessment:  1 CKD 3 2 AKI, hemodynamically mediated  Plan: 1Supportive 2 Follow renal fct  Subjective: Interval History: Issues with CPAP use  Objective: Vital signs in last 24 hours: Temp:  [97.7 F (36.5 C)-98.8 F (37.1 C)] 97.7 F (36.5 C) (09/07 1253) Pulse Rate:  [84-102] 93 (09/07 1253) Resp:  [17-32] 19 (09/07 0955) BP: (129-140)/(56-97) 139/97 (09/07 1253) SpO2:  [95 %-100 %] 98 % (09/07 1253) Weight:  [120.1 kg (264 lb 12.8 oz)] 120.1 kg (264 lb 12.8 oz) (09/07 0319) Weight change: -1.497 kg (-3 lb 4.8 oz)  Intake/Output from previous day: 09/06 0701 - 09/07 0700 In: 991.2 [P.O.:760; I.V.:231.2] Out: 1675 [Urine:1675] Intake/Output this shift: Total I/O In: -  Out: 500 [Urine:500]  General appearance: slowed mentation and sleepy Resp: diminished breath sounds bilaterally Chest wall: no tenderness Cardio: regular rate and rhythm, S1, S2 normal, no murmur, click, rub or gallop Extremities: edema 1+  Lab Results:  Recent Labs  09/01/17 0529 09/02/17 0358  WBC 23.6* 22.6*  HGB 11.8* 11.6*  HCT 37.1 37.1  PLT 153 151   BMET:  Recent Labs  09/01/17 0529 09/02/17 0358  NA 144 140  K 4.1 4.2  CL 102 100*  CO2 27 29  GLUCOSE 144* 165*  BUN 111* 107*  CREATININE 3.85* 3.79*  CALCIUM 9.0 9.1   No results for input(s): PTH in the last 72 hours. Iron Studies: No results for input(s): IRON, TIBC, TRANSFERRIN, FERRITIN in the last 72 hours. Studies/Results: No results found.  Scheduled: . cephALEXin  250 mg Oral Q12H  . cholecalciferol  1,000 Units Oral QHS  . collagenase   Topical Daily  . Ferrous Fumarate  1 tablet Oral BID WC  . furosemide  40 mg Oral Daily  . Gerhardt's butt cream   Topical BID  . insulin aspart  0-5 Units Subcutaneous QHS  . insulin aspart  0-9 Units Subcutaneous TID WC  . insulin glargine  10 Units Subcutaneous QHS  . levothyroxine  137 mcg Oral QAC breakfast  . mouth rinse  15 mL Mouth Rinse BID  . metoprolol  tartrate  50 mg Oral BID  . multivitamin with minerals  1 tablet Oral Daily  . rosuvastatin  10 mg Oral q1800  . sodium chloride flush  3 mL Intravenous Q12H  . vitamin C  1,000 mg Oral QHS  . Warfarin - Pharmacist Dosing Inpatient   Does not apply q1800  . zinc sulfate  220 mg Oral Daily    LOS: 8 days   Gustave Lindeman C 09/02/2017,1:42 PM

## 2017-09-02 NOTE — Progress Notes (Signed)
Progress Note  Patient Name: Sylvia Craig Date of Encounter: 09/02/2017  Primary Cardiologist: Tami Ribas  Subjective   Very sleepy this morning, but does wake up and answer questions. Recently received pain medications.   Inpatient Medications    Scheduled Meds: . cephALEXin  250 mg Oral Q12H  . cholecalciferol  1,000 Units Oral QHS  . collagenase   Topical Daily  . Ferrous Fumarate  1 tablet Oral BID WC  . furosemide  40 mg Oral Daily  . Gerhardt's butt cream   Topical BID  . insulin aspart  0-5 Units Subcutaneous QHS  . insulin aspart  0-9 Units Subcutaneous TID WC  . insulin glargine  10 Units Subcutaneous QHS  . levothyroxine  137 mcg Oral QAC breakfast  . mouth rinse  15 mL Mouth Rinse BID  . metoprolol tartrate  50 mg Oral BID  . multivitamin with minerals  1 tablet Oral Daily  . rosuvastatin  10 mg Oral q1800  . sodium chloride flush  3 mL Intravenous Q12H  . vitamin C  1,000 mg Oral QHS  . Warfarin - Pharmacist Dosing Inpatient   Does not apply q1800  . zinc sulfate  220 mg Oral Daily   Continuous Infusions:  PRN Meds: acetaminophen **OR** acetaminophen, HYDROcodone-acetaminophen, levalbuterol, nitroGLYCERIN, ondansetron **OR** ondansetron (ZOFRAN) IV, senna-docusate   Vital Signs    Vitals:   09/02/17 0700 09/02/17 0800 09/02/17 0900 09/02/17 0955  BP:  137/86  135/64  Pulse: (!) 101 95 99 91  Resp: 19 (!) 25 (!) 25 19  Temp:    98.3 F (36.8 C)  TempSrc:    Oral  SpO2: 99% 98% 98% 98%  Weight:      Height:        Intake/Output Summary (Last 24 hours) at 09/02/17 1116 Last data filed at 09/02/17 0320  Gross per 24 hour  Intake           751.17 ml  Output             1675 ml  Net          -923.83 ml   Filed Weights   08/31/17 0424 09/01/17 0427 09/02/17 0319  Weight: 269 lb (122 kg) 268 lb 1.6 oz (121.6 kg) 264 lb 12.8 oz (120.1 kg)    Telemetry    Afib - Personally Reviewed  ECG    N/a - Personally Reviewed  Physical Exam   General:  Obese WF, wearing [place]@2L , sleepy, but awakens to answer questions.  Head: Normocephalic, atraumatic.  Neck: Supple without bruits, JVD. Lungs:  Resp regular and unlabored, diminished. Heart: Irreg Irreg, S1, S2, no S3, S4, or murmur; no rub. Abdomen: Soft, non-tender, non-distended with normoactive bowel sounds. No hepatomegaly. No rebound/guarding. No obvious abdominal masses. Extremities: No clubbing, cyanosis, chronic LE edema with legs wrapped. Neuro: Alert. Moves all extremities spontaneously. Psych: Normal affect.  Labs    Chemistry Recent Labs Lab 08/31/17 0724 09/01/17 0529 09/02/17 0358  NA 141 144 140  K 4.3 4.1 4.2  CL 104 102 100*  CO2 GLUCOSE 113* 144* 165*  BUN 106* 111* 107*  CREATININE 3.92* 3.85* 3.79*  CALCIUM 8.7* 9.0 9.1  ALBUMIN 2.2* 2.2* 2.3*  GFRNONAA 11* 11* 11*  GFRAA 13* 13* 13*  ANIONGAP <BKalena ManderG> Hematology Recent Labs Lab 08/31/17 0724 09/01/17 0529 09/02/17 0358  WBC 24.9* 23.6* 22.6*  RBC 3.99 3.90 3.89  HGB 12.0 11.8* 11.6*  HCT 38.0 37.1 37.1  MCV 95.2 95.1 95.4  MCH 30.1 30.3 29.8  MCHC 31.6 31.8 31.3  RDW 14.6 14.9 14.7  PLT 138* 153 151    Cardiac Enzymes Recent Labs Lab 08/27/17 0303 08/29/17 1057  TROPONINI 0.56* 0.19*   No results for input(s): TROPIPOC in the last 168 hours.   BNPNo results for input(s): BNP, PROBNP in the last 168 hours.   DDimer No results for input(s): DDIMER in the last 168 hours.    Radiology    Dg Chest Port 1 View  Result Date: 08/31/2017 CLINICAL DATA:  Hypercapnia EXAM: PORTABLE CHEST 1 VIEW COMPARISON:  08/29/2017 FINDINGS: Cardiomegaly with vascular congestion. Mild interstitial edema pattern, improved since prior study. No confluent opacities or effusions. IMPRESSION: Improving interstitial edema pattern. Electronically Signed   By: Charlett NoseKevin  Dover M.D.   On: 08/31/2017 11:34    Cardiac Studies   2-D Doppler echocardiogram  08/30/2017: ------------------------------------------------------------------- Study Conclusions  - Left ventricle: The cavity size was moderately dilated. There was mild concentric hypertrophy. Systolic function was mildly reduced. The estimated ejection fraction was in the range of 45% to 50%. Wall motion was normal; there were no regional wall motion abnormalities. - Aortic valve: Transvalvular velocity was within the normal range. There was no stenosis. There was no regurgitation. - Mitral valve: Severely calcified annulus. Thickening and calcification of the mitral valve leaflets. Mobility was restricted. Transvalvular velocity was within the normal range. There was no evidence for stenosis. There was trivial regurgitation. - Right ventricle: The cavity size was normal. Wall thickness was normal. Systolic function was normal. - Tricuspid valve: There was mild regurgitation. - Pulmonary arteries: Systolic pressure was severely increased. PA peak pressure: 62 mm Hg (S). - Pericardium, extracardiac: There was evidence for increased RV-LV interaction demonstrated by respirophasic changes in tricuspid velocities.  Patient Profile     66 y.o. female with a hx of HTN, hypothyroidism, HLD, [name], IDDM, heart failure with preserved EF and atrial fibrillation on Eliquiswho presented for the evaluation of CHF and elevated troponin  Assessment & Plan    1. Acute on chronic combined HF: UOP 1675cc noted yesterday, but weight is trending down. Echo showed stable LV function when compared to previous. Cr stable, continues to trend down. May need to increase home PO dose to maintain euvolemia.   2. Elevated Troponin: Suspect demand ischemia 2/2 acute illness and respiratory failure. No reported anginal symptoms.   3. [name]: Known occluded Lcx and RCA with patent LAD. Not a candidate for invasive therapy at this time.   4. CKD with AKI: Cr has been trending up,  mildly improved today at 3.85. Follow closely with diuresis.  5. Chronic AFib: Rates stable on telemetry. On coumadin per pharmacy given decline in renal function.   Signed, Laverda PageLindsay Roberts, NP  09/02/2017, 11:16 AM  Pager # [phone]   The patient has been seen in conjunction with Laverda PageLindsay Roberts, [facility]. All aspects of care have been considered and discussed. The patient has been personally interviewed, examined, and all clinical data has been reviewed.   Again noted to be up tended as on the first encounter with this patient 3 days ago. Just received pain medication.  Continue maintenance diuretic therapy.  Ischemic evaluation is not currently recommended. Elevated enzymes in the setting of known chronic total occlusion of coronaries.  Obtundation is concerning to us. I suspect it is related to CO2 narcosis.

## 2017-09-02 NOTE — Evaluation (Signed)
Physical Therapy Evaluation Patient Details Name: Sylvia Craig MRN: [id number] DOB: 1951/09/19 Today's Date: 09/02/2017   History of Present Illness  Pt adm with sepsis secondary to UTI. PMH - HTN, DM, [name], afib, chf  Clinical Impression  Pt admitted with above diagnosis and presents to PT with functional limitations due to deficits listed below (See PT problem list). Pt needs skilled PT to maximize independence and safety to allow discharge to ST-SNF for further rehab. Pt requiring significant assist for all mobility and unable to amb currently. Pt agreeable with plan for SNF.     Follow Up Recommendations SNF    Equipment Recommendations  None recommended by PT    Recommendations for Other Services       Precautions / Restrictions Precautions Precautions: Fall      Mobility  Bed Mobility Overal bed mobility: Needs Assistance Bed Mobility: Supine to Sit;Sit to Supine     Supine to sit: Mod assist Sit to supine: Mod assist   General bed mobility comments: assist to bring legs over, elevate trunk into sitting. Assist to bring legs back up into bed  Transfers Overall transfer level: Needs assistance Equipment used: Rolling walker (2 wheeled) Transfers: Sit to/from Stand Sit to Stand: +2 physical assistance;Mod assist;From elevated surface         General transfer comment: Assist to bring hips and trunk up. Unable to rise from bed in low position. Had to elevate bed to achieve standing.  Ambulation/Gait             General Gait Details: Unable   Stairs            Wheelchair Mobility    Modified Rankin (Stroke Patients Only)       Balance Overall balance assessment: Needs assistance Sitting-balance support: No upper extremity supported;Feet supported Sitting balance-Leahy Scale: Fair     Standing balance support: Bilateral upper extremity supported Standing balance-Leahy Scale: Poor Standing balance comment: Walker and min assist for static  standing. Stood x 1 minute with walker.                             Pertinent Vitals/Pain Pain Assessment: No/denies pain    Home Living Family/patient expects to be discharged to:: Private residence Living Arrangements: Children Available Help at Discharge: Family;Available 24 hours/day Type of Home: House Home Access: Stairs to enter Entrance Stairs-Rails: Left Entrance Stairs-Number of Steps: 2+1 Home Layout: One level Home Equipment: Cane - single point;Tub bench;Walker - 2 wheels (lt platform for walker) Additional Comments: Home O2    Prior Function Level of Independence: Needs assistance   Gait / Transfers Assistance Needed: Modified independent. Used lt platform walker at times            Hand Dominance   Dominant Hand: Right    Extremity/Trunk Assessment   Upper Extremity Assessment Upper Extremity Assessment: Defer to OT evaluation    Lower Extremity Assessment Lower Extremity Assessment: Generalized weakness       Communication   Communication: No difficulties  Cognition Arousal/Alertness: Awake/alert Behavior During Therapy: Flat affect Overall Cognitive Status: Impaired/Different from baseline Area of Impairment: Problem solving                             Problem Solving: Slow processing;Decreased initiation;Requires verbal cues;Requires tactile cues General Comments: Very slow to respond to all question/commands      General Comments  Exercises     Assessment/Plan    PT Assessment Patient needs continued PT services  PT Problem List Decreased strength;Decreased activity tolerance;Decreased mobility;Decreased balance;Decreased cognition;Decreased knowledge of use of DME;Obesity       PT Treatment Interventions DME instruction;Gait training;Functional mobility training;Therapeutic activities;Therapeutic exercise;Balance training;Patient/family education    PT Goals (Current goals can be found in the Care  Plan section)  Acute Rehab PT Goals Patient Stated Goal: Get stronger PT Goal Formulation: With patient Time For Goal Achievement: 09/16/17 Potential to Achieve Goals: Good    Frequency Min 3X/week   Barriers to discharge Inaccessible home environment Stairs to enter home    Co-evaluation               AM-PAC PT "6 Clicks" Daily Activity  Outcome Measure Difficulty turning over in bed (including adjusting bedclothes, sheets and blankets)?: Unable Difficulty moving from lying on back to sitting on the side of the bed? : Unable Difficulty sitting down on and standing up from a chair with arms (e.g., wheelchair, bedside commode, etc,.)?: Unable Help needed moving to and from a bed to chair (including a wheelchair)?: Total Help needed walking in hospital room?: Total Help needed climbing 3-5 steps with a railing? : Total 6 Click Score: 6    End of Session Equipment Utilized During Treatment: Oxygen Activity Tolerance: Patient limited by fatigue Patient left: in bed;with call bell/phone within reach;with nursing/sitter in room Nurse Communication: Mobility status PT Visit Diagnosis: Unsteadiness on feet (R26.81);Other abnormalities of gait and mobility (R26.89);Muscle weakness (generalized) (M62.81)    Time: 1610-9604 PT Time Calculation (min) (ACUTE ONLY): 26 min   Charges:   PT Evaluation $PT Eval Moderate Complexity: 1 Mod PT Treatments $Therapeutic Activity: 8-22 mins   PT G Codes:        [facility] PT [phone]   Angelina Ok [facility] 09/02/2017, 1:45 PM

## 2017-09-02 NOTE — Progress Notes (Signed)
PHARMACY NOTE:  ANTIMICROBIAL RENAL DOSAGE ADJUSTMENT  Current antimicrobial regimen includes a mismatch between antimicrobial dosage and estimated renal function.  As per policy approved by the Pharmacy & Therapeutics and Medical Executive Committees, the antimicrobial dosage will be adjusted accordingly.  Current antimicrobial dosage: keflex 500mg  po q12h  Indication: Sepsis due to UTI  Renal Function:  Estimated Creatinine Clearance: 18.7 mL/min (A) (by C-G formula based on SCr of 3.79 mg/dL (H)).    Antimicrobial dosage has been changed to:  Kelfex 250mg  po q12h  Thank you for allowing pharmacy to be a part of this patient's care.  Harland GermanAndrew Kastin Cerda, Pharm D 09/02/2017 10:53 AM

## 2017-09-02 NOTE — Care Management Note (Signed)
Case Management Note  Patient Details  Name: Sylvia Craig MRN: [id number] Date of Birth: 1951-10-22  Subjective/Objective: Pt presented for UTI and treating for Sepsis and left Hand Cellulitis. Pt with increased confusion and was placed on BIPAP earlier in the week. Pt is from home with the support of her brother. PT/OT recommendations for SNF. Pt is agreeable and CSW to monitor for disposition needs. Pt will need CPAP @ d/c. CSW is aware.                     Action/Plan: No further needs from CM at this time.   Expected Discharge Date:                  Expected Discharge Plan:  Skilled Nursing Facility  In-House Referral:  Clinical Social Work  Discharge planning Services  CM Consult  Post Acute Care Choice:  NA Choice offered to:  NA  DME Arranged:  N/A DME Agency:  NA  HH Arranged:  NA HH Agency:  NA  Status of Service:  Completed, signed off  If discussed at Long Length of Stay Meetings, dates discussed:    Additional Comments:  Gala LewandowskyGraves-Bigelow, Idriss Quackenbush Kaye, RN 09/02/2017, 11:13 AM

## 2017-09-02 NOTE — Care Management Important Message (Signed)
Important Message  Patient Details  Name: Sylvia Craig MRN: [id number] Date of Birth: 05-30-1951   Medicare Important Message Given:  Yes    Tc Kapusta Abena 09/02/2017, 9:35 AM

## 2017-09-02 NOTE — NC FL2 (Signed)
[place] MEDICAID FL2 LEVEL OF CARE SCREENING TOOL     IDENTIFICATION  Patient Name: Sylvia Craig Birthdate: 20-Mar-1951 Sex: female Admission Date (Current Location): 08/25/2017  [facility] [place]   Facility and Address:  [facility] [place], [address], [place], [place] [address]      Provider Number: [id number]  Attending Physician Name and Address:  Latrelle DodrillMcIntyre, Brittany J, MD  Relative Name and Phone Number:  Lenny PastelMatthew Dyke, son, [phone]    Current Level of Care: Hospital Recommended Level of Care: Skilled Nursing Facility Prior Approval Number:    Date Approved/Denied:   PASRR Number:  [id number]  Discharge Plan: SNF    Current Diagnoses: Patient Active Problem List   Diagnosis Date Noted  . Hypercapnia   . Acute respiratory failure with hypercapnia (HCC)   . Atrial fibrillation with RVR (HCC) 08/25/2017  . [name] (coronary artery disease) 08/25/2017  . Elevated troponin 08/25/2017  . AKI (acute kidney injury) (HCC) 08/25/2017  . Hyponatremia 08/25/2017  . UTI (urinary tract infection) 08/25/2017  . Sepsis secondary to UTI (HCC) 08/25/2017  . Multiple open wounds of lower leg 08/25/2017  . Unsteady gait 08/11/2017  . Hypoxemia   . Obstructive sleep apnea   . Necrotic wound of left hand (HCC)   . SOB (shortness of breath)   . Morbid obesity (HCC) 07/26/2017  . Open wound of hand with complication 07/25/2017  . Cellulitis of hand   . Uncontrolled type 2 diabetes mellitus with ketoacidosis without coma (HCC)   . Chronic diastolic CHF (congestive heart failure) (HCC)   . Cellulitis 07/24/2017    Orientation RESPIRATION BLADDER Height & Weight     Self, Time, Place  O2, Other (Comment) (nasal cannula 2L; CPAP, see comments for settings) Incontinent, External catheter Weight: 264 lb 12.8 oz (120.1 kg) Height:  5\' 5"  (165.1 cm)  BEHAVIORAL SYMPTOMS/MOOD NEUROLOGICAL BOWEL NUTRITION STATUS      Incontinent Diet (please  see DC summary)  AMBULATORY STATUS COMMUNICATION OF NEEDS Skin   Extensive Assist Verbally PU Stage and Appropriate Care (venus stasis ulcer lower leg, gauze dressing)                       Personal Care Assistance Level of Assistance  Bathing, Feeding, Dressing Bathing Assistance: Maximum assistance Feeding assistance: Limited assistance Dressing Assistance: Maximum assistance     Functional Limitations Info  Sight, Hearing, Speech Sight Info: Adequate Hearing Info: Adequate Speech Info: Adequate    SPECIAL CARE FACTORS FREQUENCY  PT (By licensed PT), OT (By licensed OT)     PT Frequency: 5x/week OT Frequency: 5x/week            Contractures Contractures Info: Not present    Additional Factors Info  Code Status, Allergies, Insulin Sliding Scale Code Status Info: Full Allergies Info: Glucosamine, Shellfish-derived Products, Codeine, Atorvastatin, Lisinopril, Other, Oxycodone-aspirin   Insulin Sliding Scale Info: insulin 3x/day with meals and at bedtime       Current Medications (09/02/2017):  This is the current hospital active medication list Current Facility-Administered Medications  Medication Dose Route Frequency Provider Last Rate Last Dose  . acetaminophen (TYLENOL) tablet 650 mg  650 mg Oral Q6H PRN Opyd, Lavone Neriimothy S, MD   650 mg at 09/01/17 1012   Or  . acetaminophen (TYLENOL) suppository 650 mg  650 mg Rectal Q6H PRN Opyd, Lavone Neriimothy S, MD      . cephALEXin (KEFLEX) capsule 250 mg  250  mg Oral Q12H Silvana Newness, [facility]      . cholecalciferol (VITAMIN D) tablet 1,000 Units  1,000 Units Oral QHS Briscoe Deutscher, MD   1,000 Units at 09/01/17 2139  . collagenase (SANTYL) ointment   Topical Daily Johnson, Clanford L, MD      . Ferrous Fumarate (HEMOCYTE - 106 mg FE) tablet 106 mg of iron  1 tablet Oral BID WC Opyd, Lavone Neri, MD   106 mg of iron at 09/02/17 1017  . furosemide (LASIX) tablet 40 mg  40 mg Oral Daily Palma Holter, MD   40 mg at 09/02/17 1254   . Gerhardt's butt cream   Topical BID Johnson, Clanford L, MD      . HYDROcodone-acetaminophen (NORCO/VICODIN) 5-325 MG per tablet 1-2 tablet  1-2 tablet Oral Q4H PRN Opyd, Lavone Neri, MD   2 tablet at 09/02/17 [phone]  . insulin aspart (novoLOG) injection 0-5 Units  0-5 Units Subcutaneous QHS Almon Hercules, MD   2 Units at 09/01/17 2140  . insulin aspart (novoLOG) injection 0-9 Units  0-9 Units Subcutaneous TID WC Almon Hercules, MD   1 Units at 09/02/17 1255  . insulin glargine (LANTUS) injection 10 Units  10 Units Subcutaneous QHS [name], [place], DO   10 Units at 09/01/17 2140  . levalbuterol (XOPENEX) nebulizer solution 0.63 mg  0.63 mg Nebulization Q6H PRN Latrelle Dodrill, MD   0.63 mg at 08/30/17 0508  . levothyroxine (SYNTHROID, LEVOTHROID) tablet 137 mcg  137 mcg Oral QAC breakfast [name], [place], DO   137 mcg at 09/02/17 0851  . MEDLINE mouth rinse  15 mL Mouth Rinse BID Opyd, Lavone Neri, MD   15 mL at 09/02/17 1021  . metoprolol tartrate (LOPRESSOR) tablet 50 mg  50 mg Oral BID Candelaria Stagers T, MD   50 mg at 09/02/17 1021  . multivitamin with minerals tablet 1 tablet  1 tablet Oral Daily Opyd, Lavone Neri, MD   1 tablet at 09/02/17 1021  . nitroGLYCERIN (NITROSTAT) SL tablet 0.4 mg  0.4 mg Sublingual Q5 min PRN Opyd, Lavone Neri, MD      . ondansetron (ZOFRAN) tablet 4 mg  4 mg Oral Q6H PRN Opyd, Lavone Neri, MD       Or  . ondansetron (ZOFRAN) injection 4 mg  4 mg Intravenous Q6H PRN Opyd, Lavone Neri, MD      . rosuvastatin (CRESTOR) tablet 10 mg  10 mg Oral q1800 Opyd, Lavone Neri, MD   10 mg at 09/01/17 1800  . senna-docusate (Senokot-S) tablet 1 tablet  1 tablet Oral QHS PRN Opyd, Lavone Neri, MD      . sodium chloride flush (NS) 0.9 % injection 3 mL  3 mL Intravenous Q12H Opyd, Lavone Neri, MD   3 mL at 09/02/17 1021  . vitamin C (ASCORBIC ACID) tablet 1,000 mg  1,000 mg Oral QHS Opyd, Lavone Neri, MD   1,000 mg at 09/01/17 2141  . Warfarin - Pharmacist Dosing Inpatient   Does not apply q1800  Cleora Fleet, MD   Stopped at 08/31/17 1800  . zinc sulfate capsule 220 mg  220 mg Oral Daily Opyd, Lavone Neri, MD   220 mg at 09/02/17 1021     Discharge Medications: Please see discharge summary for a list of discharge medications.  Relevant Imaging Results:  Relevant Lab Results:   Additional Information CPAP (settings in comments)  Abigail Butts, LCSW

## 2017-09-02 NOTE — Progress Notes (Signed)
Rt called to bedside to place pt on BIPAP. Placed pt on BIPAP 18/5. 30% Rate of 14. PT S are stable and pt tolerating well. Rt to cont to monitor.

## 2017-09-02 NOTE — Discharge Summary (Signed)
Family Medicine Teaching [facility] Discharge Summary  Patient name: Sylvia Craig Medical record number: [id number] Date of birth: 12-18-1951 Age: 66 y.o. Gender: female Date of Admission: 08/25/2017  Date of Discharge: 09/05/17  Admitting Physician: Briscoe Deutscher, MD  Primary Care Provider: [name], MD Consultants: Cardiology, Nephrology  Indication for Hospitalization: generalized weakness  Discharge Diagnoses/Problem List:  UTI (resolved sepsis) OSA T2DM Acute on Chronic Renal Failure Atrial Fibrillation HFpEF Leg wounds and sacral ulcer Peripheral Vascular Disease  Disposition: SNF  Discharge Condition: Stable  Discharge Exam:  General: obese female sitting up in bed in no acute distress Cardiovascular: Irregular rhythm, normal rate, no m/r/g Respiratory: Mild wheezing noted at bases, normal work of breahting Abdomen: soft, non-tender, BS+ Extremities: diffuse bruising on left arm, left hand and left lower leg wrapped in bandaging, BLLE with +1 pitting edema  Brief Hospital Course:  66 y.o. female with medical history significant for hypertension, hypothyroidism, hyperlipidemia, coronary artery disease, insulin-dependent diabetes mellitus, [name], presented to the ED for evaluation of left shoulder ache, weakness, and UTI diagnosed by her PCP yesterday. She reported her legs "giving out", resulting in minor falls. She had a dull ache in the left shoulder, that radiated up into her left neck. She endorsed dysuria and pain in her left flank, stated that she was diagnosed with UTI by her PCP and took 1 dose of her prescribed ciprofloxacin.  In the ED, she had a temp of 100.2 and was tachycardic to 127, and with vitals otherwise stable. EKG features atrial fibrillation with RVR, rate 127. Noncontrast head CT was negative for acute intracranial abnormality. Chemistry panel reveals a sodium of 127, BUN 85, and creatinine of 2.90, up from  1.2 earlier this month. CBC was notable for a leukocytosis to 18,600 and a new mild thrombocytopenia with platelets 130,000. Troponin was elevated to a value of 1.22 and BNP is elevated 396. CTA PE study was performed and notable for cardiomegaly with coronary artery disease, but no PE.  Patient required BiPAP for hypercapnia until 09/05 when she was placed on home oxygen of 2L [place] with BiPAP at night. CPAP at night for the first time on 09/07. Urine culture from PCP before admission grew pan-sensitive E coli. Blood and urine cultures while admitted were negative. She was placed on Zosyn (8/31-9/5), Vanc (9/3-9/5).  Switched to Rocephin for one day on 9/5, but due to clinical improvement is being discharged on Keflex 250 mg BID for a full 14 day course. Last dose will be 09/14.   Patient with noted fluid overload after admission. Weight on admission 264> 276 on 9/2 > 262 on day of discharge. Initial CXR showed cardiomegaly and low lung volume concerning for fluid retention. ECHO showed EF 45-50%, Systolic pressure was severely increased. PA peak pressure: 62 mm Hg. Discharged with home lasix 40 mg po after requiring 3 days of 80 IV Lasix to help with volume overload. Volume overload also believed to have added to her AKI. Cr improved with diuretics with peak of 3.96 on 09/04 to 2.18 on 09/10.  Issues for Follow Up:  1. UTI: Keflex 250 mg BID for 14 day course. Last dose will be 09/14. 2. PVD: Will need outpatient follow up by vascular surgery for ABI results. 3. OSA: Ensure patient receives new CPAP.  4. Patient discharged on her home oxygen requirement of 2 liters [place].  5. T2DM: Patient being discharged on 12 U Lantus at bedtime with sliding scale insulin meal  coverage. Patient was previously on 18 U at home with 4 U meal coverage. Will need close follow up after discharge from SNF.  6. AKI with CKD: Patient will need BMET monitored to ensure AKI has resolved. Unsure of baseline Cr. Was 1.7 on 07/24/2017.   7. Afib: Patient previously on Eliquis (last dose was 8/30). Due to AKI she is being discharged on warfarin (heparin discontinued 9/7). Pharmacy  recommends Warfarin 2.5mg  daily on discharge with close INR follow-up. Please check INR in 2 days to ensure therapeutic dose with target of 2.5 (2-3 range).  8. HFpEF: Patient should have fluid status closely monitored. Discharged on home Lasix 40 mg daily. May need to increase home PO dose to maintain euvolemia.  Significant Procedures:   Significant Labs and Imaging:   Recent Labs Lab 09/01/17 0529 09/02/17 0358 09/04/17 0212  WBC 23.6* 22.6* 17.7*  HGB 11.8* 11.6* 10.8*  HCT 37.1 37.1 35.6*  PLT 153 151 149*    Recent Labs Lab 09/01/17 0529 09/02/17 0358 09/03/17 0549 09/04/17 0212 09/05/17 0128  NA 144 140 139 137 139  K 4.1 4.2 4.2 4.3 4.1  CL 102 100* 99* 98* 98*  CO2 32 33*  GLUCOSE 144* 165* 133* 250* 233*  BUN 111* 107* 97* 89* 73*  CREATININE 3.85* 3.79* 3.21* 2.66* 2.18*  CALCIUM 9.0 9.1 9.0 8.8* 8.9  PHOS 3.6 4.0 4.3 3.1 2.5  ALBUMIN 2.2* 2.3* 2.1* 2.1* 2.1*   Echocardiography: Study Conclusions - Left ventricle: The cavity size was moderately dilated. There was   mild concentric hypertrophy. Systolic function was mildly   reduced. The estimated ejection fraction was in the range of 45%   to 50%. Wall motion was normal; there were no regional wall   motion abnormalities. - Aortic valve: Transvalvular velocity was within the normal range.   There was no stenosis. There was no regurgitation. - Mitral valve: Severely calcified annulus. Thickening and   calcification of the mitral valve leaflets. Mobility was   restricted. Transvalvular velocity was within the normal range.   There was no evidence for stenosis. There was trivial   regurgitation. - Right ventricle: The cavity size was normal. Wall thickness was   normal. Systolic function was normal. - Tricuspid valve: There was mild regurgitation. -  Pulmonary arteries: Systolic pressure was severely increased. PA   peak pressure: 62 mm Hg (S). - Pericardium, extracardiac: There was evidence for increased RV-LV   interaction demonstrated by respirophasic changes in tricuspid   velocities.  ABI completed:  Right ABI non compressible. Left ABI indicates mild arterial disease, but may be falsely elevated. Bilateral TBI abnormal: Right 0.58, Left 0.25.  Results/Tests Pending at Time of Discharge:   Discharge Medications:  Allergies as of 09/05/2017      Reactions   Glucosamine Shortness Of Breath   Shellfish-derived Products Shortness Of Breath   Codeine Other (See Comments)   Rash, edema   Atorvastatin Other (See Comments)   Myalgias    Lisinopril Other (See Comments)   Cough    Other Other (See Comments)   Crab, chest tightness   Oxycodone-aspirin Other (See Comments)   Rash, dreams      Medication List    STOP taking these medications   apixaban 2.5 MG Tabs tablet Commonly known as:  ELIQUIS   ciprofloxacin 500 MG tablet Commonly known as:  CIPRO   ELIQUIS 5 MG Tabs tablet Generic drug:  apixaban   insulin lispro 100 UNIT/ML KiwkPen Commonly  known as:  HUMALOG KWIKPEN     TAKE these medications   acetaminophen 500 MG tablet Commonly known as:  TYLENOL Take 1,000 mg by mouth every 6 (six) hours as needed for moderate pain or headache.   cephALEXin 250 MG capsule Commonly known as:  KEFLEX Take 1 capsule (250 mg total) by mouth every 12 (twelve) hours. What changed:  medication strength  how much to take   collagenase ointment Commonly known as:  SANTYL Apply topically daily.   Ferrous Fumarate 324 (106 Fe) MG Tabs tablet Commonly known as:  HEMOCYTE - 106 mg FE Take 1 tablet by mouth 2 (two) times daily.   furosemide 40 MG tablet Commonly known as:  LASIX Take 1 tablet (40 mg total) by mouth daily.   Gerhardt's butt cream Crea Apply 1 application topically 2 (two) times daily.   insulin  aspart 100 UNIT/ML injection Commonly known as:  novoLOG Inject 0-9 Units into the skin 3 (three) times daily with meals.   insulin glargine 100 UNIT/ML injection Commonly known as:  LANTUS Inject 0.12 mLs (12 Units total) into the skin at bedtime. What changed:  how much to take   levothyroxine 137 MCG tablet Commonly known as:  SYNTHROID, LEVOTHROID Take 137 mcg by mouth daily before breakfast.   metoprolol succinate 100 MG 24 hr tablet Commonly known as:  TOPROL-XL Take 1 tablet (100 mg total) by mouth daily.   multivitamin with minerals Tabs tablet Take 1 tablet by mouth daily.   nitroGLYCERIN 0.4 MG SL tablet Commonly known as:  NITROSTAT Take 0.4 mg under the toungue as needed for chest pain   rosuvastatin 10 MG tablet Commonly known as:  CRESTOR Take 10 mg by mouth at bedtime.   senna-docusate 8.6-50 MG tablet Commonly known as:  Senokot-S Take 1 tablet by mouth at bedtime as needed for mild constipation.   vitamin C 500 MG tablet Commonly known as:  ASCORBIC ACID Take 1,000 mg by mouth at bedtime.   VITAMIN D-1000 MAX ST 1000 units tablet Generic drug:  Cholecalciferol Take 1,000 Units by mouth at bedtime.   warfarin 2.5 MG tablet Commonly known as:  COUMADIN Take 1 tablet (2.5 mg total) by mouth daily at 6 PM.   zinc sulfate 220 (50 Zn) MG capsule Take 1 capsule (220 mg total) by mouth daily.            Durable Medical Equipment        Start     Ordered   09/05/17 1344  For home use only DME continuous positive airway pressure (CPAP)  Once    Question Answer Comment  Patient has OSA or probable OSA Yes   Settings Autotitration   CPAP supplies needed Mask, headgear, cushions, filters, heated tubing and water chamber      09/05/17 1343       Discharge Care Instructions        Start     Ordered   09/05/17 0000  cephALEXin (KEFLEX) 250 MG capsule  Every 12 hours     09/05/17 1402   09/05/17 0000  collagenase (SANTYL) ointment  Daily      09/05/17 1402   09/05/17 0000  Hydrocortisone (GERHARDT'S BUTT CREAM) CREA  2 times daily     09/05/17 1402   09/05/17 0000  insulin glargine (LANTUS) 100 UNIT/ML injection  Daily at bedtime     09/05/17 1402   09/05/17 0000  insulin aspart (NOVOLOG) 100 UNIT/ML injection  3 times daily  with meals     09/05/17 1402   09/05/17 0000  senna-docusate (SENOKOT-S) 8.6-50 MG tablet  At bedtime PRN     09/05/17 1402   09/05/17 0000  warfarin (COUMADIN) 2.5 MG tablet  Daily-1800     09/05/17 1402     Discharge Instructions: Please refer to Patient Instructions section of EMR for full details.  Patient was counseled important signs and symptoms that should prompt return to medical care, changes in medications, dietary instructions, activity restrictions, and follow up appointments.   Follow-Up Appointments:   Alanis Clift, [place], DO 09/05/2017, 2:14 PM PGY-1, [facility]

## 2017-09-02 NOTE — Progress Notes (Signed)
ANTICOAGULATION CONSULT NOTE - Follow Up Consult  Pharmacy Consult for  Coumadin Indication: atrial fibrillation  Allergies  Allergen Reactions  . Glucosamine Shortness Of Breath  . Shellfish-Derived Products Shortness Of Breath  . Codeine Other (See Comments)    Rash, edema  . Atorvastatin Other (See Comments)    Myalgias   . Lisinopril Other (See Comments)    Cough   . Other Other (See Comments)    Crab, chest tightness  . Oxycodone-Aspirin Other (See Comments)    Rash, dreams    Patient Measurements: Height: 5\' 5"  (165.1 cm) Weight: 264 lb 12.8 oz (120.1 kg) IBW/kg (Calculated) : 57 Heparin Dosing Weight: 88 kg  Vital Signs: Temp: 98.3 F (36.8 C) (09/07 0955) Temp Source: Oral (09/07 0955) BP: 135/64 (09/07 0955) Pulse Rate: 91 (09/07 0955)  Labs:  Recent Labs  08/31/17 0724 09/01/17 0529 09/02/17 0358  HGB 12.0 11.8* 11.6*  HCT 38.0 37.1 37.1  PLT 138* 153 151  APTT 112* 87* 120*  LABPROT 24.1* 26.8* 32.3*  INR 2.18 2.50 3.17  HEPARINUNFRC 1.08* 0.75* 0.65  CREATININE 3.92* 3.85* 3.79*    Estimated Creatinine Clearance: 18.7 mL/min (A) (by C-G formula based on SCr of 3.79 mg/dL (H)).  Assessment:  66 y.o. female with a history of Afib on Eliquis (last dose was 8/30). Due to AKI she is now on coumadin (heparin discontinued 9/7). INR up to 3.17, despite no Coumadin given on 9/3 or 9/4 while on BiPAP. LFTs WNL, tbili= 1.0 Possible impact of recent antibiotics?  Goal of Therapy:  INR= 2-3 Monitor platelets by anticoagulation protocol: Yes   Plan:  -Hold coumadin today -Daily PT/INR  Harland GermanAndrew Viliami Bracco, Pharm D 09/02/2017 10:55 AM

## 2017-09-02 NOTE — Progress Notes (Signed)
Came to the room to place pt on bipap. She says mask gives her a headache but she will try it. She has just received her lunch tray and is going to try to eat something. I will check back 30 minutes after pt finishes her meal to attempt bipap placement.

## 2017-09-02 NOTE — Progress Notes (Signed)
Family Medicine Teaching Service Daily Progress Note Intern Pager: [phone]  Patient name: Sylvia Craig Medical record number: [id number] Date of birth: 02-27-1951 Age: 66 y.o. Gender: female  Primary Care Provider: Lovena Neighboursiallo, [name], Craig Consultants: Nephrology, Cardiology Code Status: Full  Pt Overview and Major Events to Date:  Increase SOB and WOB placed on BiPAP   Assessment and Plan: Sylvia Craig a 66 y.o.femalewith medical history significant forhypertension, hypothyroidism, hyperlipidemia, [name], insulin-dependent diabetes mellitus, [name], now presenting for evaluation of left shoulder ache, weakness, and UTI diagnosed by her PCP on 8/30.   Assessment/Plan  Sepsis secondary to UTI, imrpoving: WBC elevated to 22.6 on 9/7. Patient has normal heart rate with BP of 124/59 and temp 97.5. Blood and urine cultures show no growth to date.Urine culture from 1 wk ago grew pan-sensitive E coli.  -- Zosyn (8/31-9/5 ), Vanc (9/3-9/5); deescalated to Rocephin on 9/5 and d/c -- On Keflex 250 mg BID ending on 9/14 for a 14 day course- per pharmacy dosing -- Follow up on CBC: WBC 18.6 > 24.9> 22.6 on 9/7 -- Repeat procalcitonin 42.23 > 5.81> 2.94 on 9/6 -- PT/OT consulted for evaluation  New Onset Chest Pain: new onset on the left side of chest, reports no radiation of pain, reproducible on exam, given history will investigate further -- Patient with chest pain this am -- will get an EKG and trend troponins 0.19 on 9/3> 0.10 on 9/7  OSA: CPAP  - Use CPAP at Craig- on BiPAP last Craig - Needs new home CPAP  T2DM: A1c was 9.8% in July 2018. Home regimen with Lantus 18 units qHS and Humalog 4 TID.  --CBG's 150's- 249's on sensitive SSI- received 9U on 9/6, on home Lantus of 10 U  --continue to monitor CBG q4  Acute on Chronic Renal Failure, improving: CKD, stage 3. SCr is 2.90> 3.96 > 3.79 on 9/7. Urine studies and renal US showed medical renal disease and no  obstruction or hydronephrosis. DDx: ATN, septic AKI, volume overload- now euvolemic  --S/p 3 doses of 80 mg IV Lasix for volume overload -- Continue at home lasix of 40 mg    Elevated troponin, [name]: 0.19 on 9/3> 0.10 on 9/7. There was evidence of [name] on CTA. Likely demand ischemia with CHF and respiratory failure.  --Consulted Cardiology-- No plan for ischemic evaluation at this point. --Continue cardiac monitoring --Continue metoprolol 100 mg daily, Continue Rosuvastatin 10 mg daily --Continue NTG as needed -- ABI- R: Non compressible L: 0.85  Atrial fibrillation with RVR, on heparin drip: Patient is on Eliquis and Toprol at home. On admission patient started on hep drip and transitioned to coumadin for anticoagulation given current renal function. --Continue metoprolol 100 mg daily --Coumadin per pharmacy, heparin d/c: PT 32.3 INR 3.17  Acute on chronic systolic and diastolic HF: Weight on admission 264> 276 on 9/2 > 264 on 9/7 . Initial CXR showed cardiomegaly and low lung volume concerning for fluid retention. ECHO shows EF 45-50%, Systolic pressure was severely increased. PA peak pressure: 62 mm Hg  --Daily wts and I/O's-  s/p 80 IV Lasix x3- Output of 1.7 L yesterday with one unmeasured  - Continue to diurese with home lasix   Thrombocytopenia, resolved: On admission, Platelets were 130k  With a previously normal baseline. This admission nadir at 117 on 8/31, has since gradually improved to 151 on 9/7. No sign of bleeding or clotting.  --Will continue to monitor with daily CBC - Transition to coumadin  Leg wounds and developing buttock ulcer: Bilateral LE's with stigmata of chronic venous stasis.There are some wounds bilaterally with serous weeping, maceration, surrounding erythema. Possibly developing cellulitis. Worsening leukocytosis. Patient is currently on Rocephin. Patient is afebrile with the rest of her vitals within normal limits.  - Wound care recommend to cleanse buttocks  with soap and water and pat dry.  Apply Sylvia Craig butt cream twice daily. Keep skin clean and dry.  --Continue wound care    -- Seen by plastic surgery for left hand wound- continue wound care  Hypothyroidism: chronic. On 137 mcg of synthroid daily - Continue 137 mcg of synthroid daily  FEN/GI: Carb modified Diet PPx: Heparin drip   Disposition: Pending PT/OT eval  Subjective: Patient complaining of BiPAP last Craig, asked nursing if they can ensure she is on CPAP. Also complaining of new onset left sided chest pain that doesn't radiate anywhere, and is reproducible.   Objective: Temp:  [97.8 F (36.6 C)-98.8 F (37.1 C)] 97.8 F (36.6 C) (09/07 0319) Pulse Rate:  [84-102] 96 (09/07 0319) Resp:  [17-32] 21 (09/07 0319) BP: (115-140)/(56-83) 140/83 (09/07 0319) SpO2:  [95 %-100 %] 98 % (09/07 0319) Weight:  [264 lb 12.8 oz (120.1 kg)] 264 lb 12.8 oz (120.1 kg) (09/07 0319)   Physical Exam: General: Obese woman, in bed, NAD, responsive and able to participate in exam Cardiac: irregular rhythm, normal heart sounds, no murmurs, tender on left side of chest Respiratory: Wheezing noted on bases, no increase work of breathing on 2L [place] Abdomen: soft, nontender, nondistended, +BS Extremities:  Bilateral lower extremities wrapped, L hand wrapped, no edema noted on BLLE Skin: warm and dry, no rashes noted Neuro: alert and oriented x4, no focal deficits Psych: Normal affect and mood  Laboratory:  Recent Labs Lab 08/31/17 0724 09/01/17 0529 09/02/17 0358  WBC 24.9* 23.6* 22.6*  HGB 12.0 11.8* 11.6*  HCT 38.0 37.1 37.1  PLT 138* 153 151    Recent Labs Lab 08/31/17 0724 09/01/17 0529 09/02/17 0358  NA 141 144 140  K 4.3 4.1 4.2  CL 104 102 100*  CO2 BUN 106* 111* 107*  CREATININE 3.92* 3.85* 3.79*  CALCIUM 8.7* 9.0 9.1  GLUCOSE 113* 144* 165*     Imaging/Diagnostic Tests: No results found. ECHO 9/5 Study Conclusions  - Left ventricle: The cavity size  was moderately dilated. There was   mild concentric hypertrophy. Systolic function was mildly   reduced. The estimated ejection fraction was in the range of 45%   to 50%. Wall motion was normal; there were no regional wall   motion abnormalities. - Aortic valve: Transvalvular velocity was within the normal range.   There was no stenosis. There was no regurgitation. - Mitral valve: Severely calcified annulus. Thickening and   calcification of the mitral valve leaflets. Mobility was   restricted. Transvalvular velocity was within the normal range.   There was no evidence for stenosis. There was trivial   regurgitation. - Right ventricle: The cavity size was normal. Wall thickness was   normal. Systolic function was normal. - Tricuspid valve: There was mild regurgitation. - Pulmonary arteries: Systolic pressure was severely increased. PA   peak pressure: 62 mm Hg (S). - Pericardium, extracardiac: There was evidence for increased RV-LV   interaction demonstrated by respirophasic changes in tricuspid   velocities.  Maeva Dant, [place], DO 09/02/2017, 9:33 AM PGY-1, [facility] FPTS Intern pager: [phone], text pages welcome

## 2017-09-02 NOTE — Clinical Social Work Note (Signed)
Clinical Social Work Assessment  Patient Details  Name: Sylvia Craig MRN: [id number] Date of Birth: July 28, 1951  Date of referral:  09/02/17               Reason for consult:  Facility Placement                Permission sought to share information with:  Family Supports, Customer service manager Permission granted to share information::  Yes, Verbal Permission Granted  Name::     Liboria Putnam  Agency::  SNFs  Relationship::  son  Contact Information:  [phone]  Housing/Transportation Living arrangements for the past 2 months:  [place] of Information:  Patient Patient Interpreter Needed:  None Criminal Activity/Legal Involvement Pertinent to Current Situation/Hospitalization:  No - Comment as needed Significant Relationships:  Adult Children, Other Family Members Lives with:  Adult Children Do you feel safe going back to the place where you live?  Yes Need for family participation in patient care:  No (Coment)  Care giving concerns: Patient from home with son. PT recommending SNF.   Social Worker assessment / plan: CSW met with patient at bedside. Patient lethargic and unable to answer most questions but indicated she was agreeable to go to SNF. CSW spoke to patient's son via phone. Son indicated he assumed patient would need SNF and is agreeable to her going to SNF. Patient does not have social security number listed in chart. CSW requested SSN from son but he could not find it. Son stated he will try to find patient's SS card. Patient on CPAP when social worker went back so unable to report SSN. CSW faxed out intial referrals, but will need SSN for PASRR. CSW to follow and support with discharge to SNF.  Employment status:  Retired Forensic scientist:  [employer]) PT Recommendations:  [place] / Referral to community resources:  [place]  Patient/Family's Response to care: Patient and son  appreciative of care.  Patient/Family's Understanding of and Emotional Response to Diagnosis, Current Treatment, and Prognosis: Son with very good understanding of patient's conditions and very hopeful that patient will return home after rehab.  Emotional Assessment Appearance:  Appears stated age Attitude/Demeanor/Rapport:  Lethargic Affect (typically observed):  Calm Orientation:  Oriented to Self, Oriented to Place, Oriented to  Time, Oriented to Situation Alcohol / Substance use:  Not Applicable Psych involvement (Current and /or in the community):  No (Comment)  Discharge Needs  Concerns to be addressed:  Discharge Planning Concerns Readmission within the last 30 days:  Yes Current discharge risk:  Physical Impairment Barriers to Discharge:  Continued Medical Work up   Estanislado Emms, LCSW 09/02/2017, 4:11 PM

## 2017-09-02 NOTE — Progress Notes (Signed)
OT Cancellation Note  Patient Details Name: Sylvia Craig MRN: [id number] DOB: Mar 18, 1951   Cancelled Treatment:    Reason Eval/Treat Not Completed: Fatigue/lethargy limiting ability to participate. Pt lethargic, recently given pain meds and worked with PT.  Will reattempt as able.   Pilar GrammesMathews, Deklen Popelka H 09/02/2017, 11:41 AM

## 2017-09-02 NOTE — Plan of Care (Signed)
Problem: Physical Regulation: Goal: Ability to maintain clinical measurements within normal limits will improve Outcome: Progressing Pt remained on 2L [place] most of shift. BiPap PRN. Pt awake and Alert. Wound assessed and wound care performed. Will continue to monitor.

## 2017-09-03 LAB — GLUCOSE, CAPILLARY
GLUCOSE-CAPILLARY: 285 mg/dL — AB (ref 65–99)
GLUCOSE-CAPILLARY: 289 mg/dL — AB (ref 65–99)
Glucose-Capillary: 119 mg/dL — ABNORMAL HIGH (ref 65–99)
Glucose-Capillary: 174 mg/dL — ABNORMAL HIGH (ref 65–99)

## 2017-09-03 LAB — RENAL FUNCTION PANEL
ALBUMIN: 2.1 g/dL — AB (ref 3.5–5.0)
ANION GAP: 10 (ref 5–15)
BUN: 97 mg/dL — AB (ref 6–20)
CO2: 30 mmol/L (ref 22–32)
Calcium: 9 mg/dL (ref 8.9–10.3)
Chloride: 99 mmol/L — ABNORMAL LOW (ref 101–111)
Creatinine, Ser: 3.21 mg/dL — ABNORMAL HIGH (ref 0.44–1.00)
GFR calc Af Amer: 16 mL/min — ABNORMAL LOW (ref >60)
GFR calc non Af Amer: 14 mL/min — ABNORMAL LOW (ref >60)
Glucose, Bld: 133 mg/dL — ABNORMAL HIGH (ref 65–99)
PHOSPHORUS: 4.3 mg/dL (ref 2.5–4.6)
POTASSIUM: 4.2 mmol/L (ref 3.5–5.1)
Sodium: 139 mmol/L (ref 135–145)

## 2017-09-03 LAB — PROCALCITONIN: Procalcitonin: 1.17 ng/mL

## 2017-09-03 LAB — PROTIME-INR
INR: 4.86 — AB
PROTHROMBIN TIME: 45.1 s — AB (ref 11.4–15.2)

## 2017-09-03 NOTE — Progress Notes (Signed)
ANTICOAGULATION CONSULT NOTE - Follow Up Consult  Pharmacy Consult for  warfarin Indication: atrial fibrillation  Allergies  Allergen Reactions  . Glucosamine Shortness Of Breath  . Shellfish-Derived Products Shortness Of Breath  . Codeine Other (See Comments)    Rash, edema  . Atorvastatin Other (See Comments)    Myalgias   . Lisinopril Other (See Comments)    Cough   . Other Other (See Comments)    Crab, chest tightness  . Oxycodone-Aspirin Other (See Comments)    Rash, dreams    Patient Measurements: Height: 5\' 5"  (165.1 cm) Weight: 265 lb (120.2 kg) IBW/kg (Calculated) : 57 Heparin Dosing Weight: 88 kg  Vital Signs: Temp: 98.1 F (36.7 C) (09/08 0750) Temp Source: Oral (09/08 0750) BP: 124/78 (09/08 0750) Pulse Rate: 96 (09/08 0750)  Labs:  Recent Labs  09/01/17 0529 09/02/17 0358 09/02/17 0929 09/02/17 1433 09/02/17 2025 09/03/17 0549  HGB 11.8* 11.6*  --   --   --   --   HCT 37.1 37.1  --   --   --   --   PLT 153 151  --   --   --   --   APTT 87* 120*  --   --   --   --   LABPROT 26.8* 32.3*  --   --   --  45.1*  INR 2.50 3.17  --   --   --  4.86*  HEPARINUNFRC 0.75* 0.65  --   --   --   --   CREATININE 3.85* 3.79*  --   --   --  3.21*  TROPONINI  --   --  0.10* 0.09* 0.07*  --     Estimated Creatinine Clearance: 22.1 mL/min (A) (by C-G formula based on SCr of 3.21 mg/dL (H)).  Assessment: 66 y.o. female with a history of Afib on Eliquis (last dose was 8/30). Due to AKI she is now on coumadin (heparin discontinued 9/7). INR up from 3.17 to 4.86 (supratherapeutic), despite no Coumadin given on 9/3 or 9/4 while on BiPAP. Could potentially be a drug interaction of warfarin and levothyroxine. Per RN, no signs or symptoms of bleeding.  Goal of Therapy:  INR= 2-3 Monitor platelets by anticoagulation protocol: Yes   Plan:  Hold coumadin today  Daily INR, CBCs F/u s/sx bleeding, PO intake, DDI  Donnella Biyler Ember Gottwald, PharmD PGY1 Acute Care Pharmacy  Resident Pager: [phone] 09/03/2017 8:31 AM

## 2017-09-03 NOTE — Evaluation (Signed)
Occupational Therapy Evaluation Patient Details Name: Sylvia Craig Drummer MRN: [id number] DOB: 1951-05-15 Today's Date: 09/03/2017    History of Present Illness Pt adm with sepsis secondary to UTI. PMH - HTN, DM, [name], afib, chf   Clinical Impression   Pt functions modified independently at home. She has a hx of multiple falls recently off of the couch. Pt was using a L  platform walker just prior to admission. Presents with generalized weakness, poor standing balance, decreased activity tolerance and impaired cognition. Pt on 2L 02 throughout session with stable VS. Recommending further rehab at SNF prior to return home. Will follow.  Follow Up Recommendations  SNF;Supervision/Assistance - 24 hour    Equipment Recommendations       Recommendations for Other Services       Precautions / Restrictions Precautions Precautions: Fall Restrictions Weight Bearing Restrictions: No      Mobility Bed Mobility Overal bed mobility: Needs Assistance       Supine to sit: Supervision     General bed mobility comments: increased time, no physical assist  Transfers Overall transfer level: Needs assistance Equipment used: 1 person hand held assist Transfers: Sit to/from Stand;Stand Pivot Transfers Sit to Stand: Min assist Stand pivot transfers: Min assist       General transfer comment: momentum and assist to rise and balance    Balance Overall balance assessment: Needs assistance Sitting-balance support: No upper extremity supported;Feet supported Sitting balance-Leahy Scale: Fair     Standing balance support: Bilateral upper extremity supported Standing balance-Leahy Scale: Poor                             ADL either performed or assessed with clinical judgement   ADL Overall ADL's : Needs assistance/impaired Eating/Feeding: Independent;Bed level   Grooming: Brushing hair;Sitting;Minimal assistance   Upper Body Bathing: Sitting;Moderate assistance   Lower Body  Bathing: Total assistance;Bed level   Upper Body Dressing : Minimal assistance;Sitting   Lower Body Dressing: Total assistance;Bed level   Toilet Transfer: Minimal assistance;Stand-pivot;BSC   Toileting- Clothing Manipulation and Hygiene: Total assistance;Sit to/from stand         General ADL Comments: pt with stable VS on 2L 02 during transfer     Vision Patient Visual Report: No change from baseline       Perception     Praxis      Pertinent Vitals/Pain Pain Assessment: No/denies pain     Hand Dominance Right   Extremity/Trunk Assessment Upper Extremity Assessment Upper Extremity Assessment: Generalized weakness (L wrist wrapped in ace wrap)   Lower Extremity Assessment Lower Extremity Assessment: Defer to PT evaluation       Communication Communication Communication: No difficulties   Cognition Arousal/Alertness: Awake/alert Behavior During Therapy: Flat affect Overall Cognitive Status: Impaired/Different from baseline Area of Impairment: Problem solving                             Problem Solving: Slow processing;Decreased initiation;Requires verbal cues;Requires tactile cues     General Comments       Exercises     Shoulder Instructions      Home Living Family/patient expects to be discharged to:: Private residence Living Arrangements: Children Available Help at Discharge: Family;Available 24 hours/day Type of Home: House Home Access: Stairs to enter Entergy CorporationEntrance Stairs-Number of Steps: 2+1 Entrance Stairs-Rails: Left Home Layout: One level     Bathroom Shower/Tub: Tub/shower unit  Bathroom Toilet: Standard     Home Equipment: Cane - single point;Tub bench;Walker - 2 wheels (L platform walker, 02)   Additional Comments: Home O2 2L      Prior Functioning/Environment Level of Independence: Independent with assistive device(s)  Gait / Transfers Assistance Needed: Modified independent. 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 [employer]        OT Problem List: Decreased activity tolerance;Impaired balance (sitting and/or standing);Decreased strength;Decreased cognition;Decreased knowledge of use of DME or AE;Obesity;Impaired UE functional use      OT Treatment/Interventions: Self-care/ADL training;DME and/or AE instruction;Cognitive remediation/compensation;Patient/family education;Balance training;Therapeutic activities    OT Goals(Current goals can be found in the care plan section) Acute Rehab OT Goals Patient Stated Goal: Get stronger OT Goal Formulation: With patient Time For Goal Achievement: 09/17/17 Potential to Achieve Goals: Good ADL Goals Pt Will Perform Grooming: with min guard assist;standing (2 activities) Pt Will Perform Upper Body Bathing: with supervision;sitting Pt Will Perform Upper Body Dressing: with supervision;sitting Pt Will Transfer to Toilet: with min assist;ambulating;bedside commode Pt Will Perform Toileting - Clothing Manipulation and hygiene: with min assist;sit to/from stand  OT Frequency: Min 2X/week   Barriers to D/C: Decreased caregiver support          Co-evaluation              AM-PAC PT "6 Clicks" Daily Activity     Outcome Measure Help from another person eating meals?: None Help from another person taking care of personal grooming?: A Little Help from another person toileting, which includes using toliet, bedpan, or urinal?: Total Help from another person bathing (including washing, rinsing, drying)?: A Lot Help from another person to put on and taking off regular upper body clothing?: A Little Help from another person to put on and taking off regular lower body clothing?: Total 6 Click Score: 14   End of Session Equipment Utilized During Treatment: Gait belt;Oxygen Nurse Communication: Mobility status  Activity Tolerance: Patient tolerated treatment well Patient left: in chair;with call bell/phone within  reach;with chair alarm set;with family/visitor present  OT Visit Diagnosis: Unsteadiness on feet (R26.81);Muscle weakness (generalized) (M62.81);Other symptoms and signs involving cognitive function;History of falling (Z91.81)                Time: 1610-9604 OT Time Calculation (min): 32 min Charges:  OT General Charges $OT Visit: 1 Visit OT Evaluation $OT Eval Moderate Complexity: 1 Mod OT Treatments $Self Care/Home Management : 8-22 mins G-Codes:     11-Sep-2017 Martie Round, OTR/L Pager: [phone]  Iran Planas, [place] Bailiff 2017/09/11, 3:40 PM

## 2017-09-03 NOTE — Progress Notes (Signed)
Family Medicine Teaching Service Daily Progress Note Intern Pager: [phone]  Patient name: Sylvia NightDebbie Craig Medical record number: [id number] Date of birth: 1951/10/01 Age: 66 y.o. Gender: female  Primary Care Provider: Lovena Neighboursiallo, [name], MD Consultants: Nephrology, Pharmacy Code Status: FULL  Assessment and Plan:  Sepsis secondary to UTI, resolving:  UCx prior to admission with E.Coli, pan-sensitive. Has been de-escalated to PO Keflex. On day 8/14 antibiotics. Blood cultures with no growth final. Repeat UCx on admission negative. Vital signs stable. Patient asymptomatic. Stable for discharge pending SNF placement.   New Onset Chest Pain:  Describes pain as sharp, non-radiating. Painful with movement. Reproducible on exam. Troponins yesterday 0.1 > 0.09 > 0.07. EKG without ischemic changes. Do not believe this to be cardiac chest pain. Patient requesting Tylenol prn.   OSA: CPAP  - Use CPAP at [facility]- do not place on BiPAP unless definite indication (somnolence suggesting hypercapnia, respiratory distress, etc).  - Needs new home CPAP  T2DM: A1c was 9.8% in July 2018. Home regimen with Lantus 18 units qHS and Humalog 4 TID.  --CBG's 130-174 on sensitive SSI & Lantus of 10 U  --continue to monitor CBG qac, qhs  Acute on Chronic Renal Failure, improving: CKD, stage 3. Cr stable/improved, 3.21 today. May be progressing to stage IV. Nephrology on board, appreciate assistance.  - Continue home lasix of 40 mg   Atrial fibrillation::  Transitioned to warfarin given renal function. INR supra-therapeutic at 4.86 today. Follow CBC in am. --Continue metoprolol 100 mg daily --Coumadin per pharmacy  Acute on chronic systolic and diastolic HF:  Weight stable at 265 lbs today. Unclear dry weight. --Daily wts and I/O's - Continue to diurese with home lasix   Leg wounds and developing buttock ulcer: Bilateral LE's with stigmata of chronic venous stasis. - Wound care recommend to cleanse  buttocks with soap and water and pat dry. Apply Gerhardt butt cream twice daily. Keep skin clean and dry.  --Continue wound care   -- Seen by plastic surgery for left hand wound- continue wound care  FEN/GI: HH/Carb mod PPx: Warfain  Disposition: SNF when bed available   Subjective:  Patient doing well this morning. Reports some sharp left sided chest pain with movement. Denies any shortness of breath or dysuria.   Objective: Temp:  [97.5 F (36.4 C)-98.1 F (36.7 C)] 98.1 F (36.7 C) (09/08 0750) Pulse Rate:  [73-99] 96 (09/08 0924) Resp:  [15-20] 18 (09/08 0839) BP: (120-151)/(61-81) 124/78 (09/08 0924) SpO2:  [96 %-99 %] 98 % (09/08 0839) FiO2 (%):  [30 %] 30 % (09/08 0349) Weight:  [265 lb (120.2 kg)] 265 lb (120.2 kg) (09/08 0359)  Physical Exam: General: Obese woman, sitting up in bed in no acute distress Cardiovascular: Irregular rhythm, normal rate, no m/r/g Respiratory: CTAB no wheezing noted, no increased work of breathing Abdomen: soft, non-tender, BS+ Extremities: diffuse bruising noted on left arm, reports chronic   Laboratory:  Recent Labs Lab 08/31/17 0724 09/01/17 0529 09/02/17 0358  WBC 24.9* 23.6* 22.6*  HGB 12.0 11.8* 11.6*  HCT 38.0 37.1 37.1  PLT 138* 153 151    Recent Labs Lab 09/01/17 0529 09/02/17 0358 09/03/17 0549  NA 144 140 139  K 4.1 4.2 4.2  CL 102 100* 99*  CO2 27 29 30   BUN 111* 107* 97*  CREATININE 3.85* 3.79* 3.21*  CALCIUM 9.0 9.1 9.0  GLUCOSE 144* 165* 133*     Valentino NoseBoswell, Michiah Mudry, MD PGY3 09/03/2017, 2:39 PM FPTS Intern pager: [phone], text pages welcome

## 2017-09-03 NOTE — Clinical Social Work Note (Signed)
Nurse was able to obtain pt's SS# from pt, FL2 updated. PASRR received. CSW attempted to contact pt's dtr Dawn for SNF selection, no answer, VM left with contact information, awaiting callback.  Following for DC planning.  Jerilyn Gillaspie B. Gean QuintBrown,MSW, LCSWA Clinical Social Work Dept Weekend Social Worker [phone] 11:32 AM

## 2017-09-03 NOTE — Progress Notes (Signed)
Assessment:  1 CKD 3 2 AKI, hemodynamically mediated-improved Plan: 1Needs mobilization  Subjective: Interval History: Awake!  Objective: Vital signs in last 24 hours: Temp:  [97.5 F (36.4 C)-98.1 F (36.7 C)] 98.1 F (36.7 C) (09/08 0750) Pulse Rate:  [73-99] 96 (09/08 0924) Resp:  [14-20] 18 (09/08 0839) BP: (120-151)/(61-97) 124/78 (09/08 0924) SpO2:  [96 %-99 %] 98 % (09/08 0839) FiO2 (%):  [30 %] 30 % (09/08 0349) Weight:  [120.2 kg (265 lb)] 120.2 kg (265 lb) (09/08 0359) Weight change: 0.091 kg (3.2 oz)  Intake/Output from previous day: 09/07 0701 - 09/08 0700 In: -  Out: 1400 [Urine:1400] Intake/Output this shift: Total I/O In: 120 [P.O.:120] Out: -   General appearance: alert and cooperative Chest wall: no tenderness Cardio: regular rate and rhythm, S1, S2 normal, no murmur, click, rub or gallop GI: soft, non-tender; bowel sounds normal; no masses,  no organomegaly and protub Extremities: edema 1+  Lab Results:  Recent Labs  09/01/17 0529 09/02/17 0358  WBC 23.6* 22.6*  HGB 11.8* 11.6*  HCT 37.1 37.1  PLT 153 151   BMET:  Recent Labs  09/02/17 0358 09/03/17 0549  NA 140 139  K 4.2 4.2  CL 100* 99*  CO2 29 30  GLUCOSE 165* 133*  BUN 107* 97*  CREATININE 3.79* 3.21*  CALCIUM 9.1 9.0   No results for input(s): PTH in the last 72 hours. Iron Studies: No results for input(s): IRON, TIBC, TRANSFERRIN, FERRITIN in the last 72 hours. Studies/Results: No results found.  Scheduled: . cephALEXin  250 mg Oral Q12H  . cholecalciferol  1,000 Units Oral QHS  . collagenase   Topical Daily  . Ferrous Fumarate  1 tablet Oral BID WC  . furosemide  40 mg Oral Daily  . Gerhardt's butt cream   Topical BID  . insulin aspart  0-5 Units Subcutaneous QHS  . insulin aspart  0-9 Units Subcutaneous TID WC  . insulin glargine  10 Units Subcutaneous QHS  . levothyroxine  137 mcg Oral QAC breakfast  . mouth rinse  15 mL Mouth Rinse BID  . metoprolol  tartrate  50 mg Oral BID  . multivitamin with minerals  1 tablet Oral Daily  . rosuvastatin  10 mg Oral q1800  . sodium chloride flush  3 mL Intravenous Q12H  . vitamin C  1,000 mg Oral QHS  . Warfarin - Pharmacist Dosing Inpatient   Does not apply q1800  . zinc sulfate  220 mg Oral Daily     LOS: 9 days   Aadhav Uhlig C 09/03/2017,11:02 AM

## 2017-09-04 DIAGNOSIS — Z9981 Dependence on supplemental oxygen: Secondary | ICD-10-CM

## 2017-09-04 DIAGNOSIS — E669 Obesity, unspecified: Secondary | ICD-10-CM

## 2017-09-04 DIAGNOSIS — Z9989 Dependence on other enabling machines and devices: Secondary | ICD-10-CM

## 2017-09-04 DIAGNOSIS — I5043 Acute on chronic combined systolic (congestive) and diastolic (congestive) heart failure: Secondary | ICD-10-CM

## 2017-09-04 DIAGNOSIS — I872 Venous insufficiency (chronic) (peripheral): Secondary | ICD-10-CM

## 2017-09-04 DIAGNOSIS — E1122 Type 2 diabetes mellitus with diabetic chronic kidney disease: Secondary | ICD-10-CM

## 2017-09-04 DIAGNOSIS — A4151 Sepsis due to Escherichia coli [E. coli]: Secondary | ICD-10-CM

## 2017-09-04 DIAGNOSIS — E1165 Type 2 diabetes mellitus with hyperglycemia: Secondary | ICD-10-CM

## 2017-09-04 DIAGNOSIS — N183 Chronic kidney disease, stage 3 (moderate): Secondary | ICD-10-CM

## 2017-09-04 DIAGNOSIS — R6520 Severe sepsis without septic shock: Secondary | ICD-10-CM

## 2017-09-04 DIAGNOSIS — R0789 Other chest pain: Secondary | ICD-10-CM

## 2017-09-04 DIAGNOSIS — Z79899 Other long term (current) drug therapy: Secondary | ICD-10-CM

## 2017-09-04 DIAGNOSIS — J9622 Acute and chronic respiratory failure with hypercapnia: Secondary | ICD-10-CM

## 2017-09-04 DIAGNOSIS — B9620 Unspecified Escherichia coli [E. coli] as the cause of diseases classified elsewhere: Secondary | ICD-10-CM

## 2017-09-04 LAB — RENAL FUNCTION PANEL
Albumin: 2.1 g/dL — ABNORMAL LOW (ref 3.5–5.0)
Anion gap: 7 (ref 5–15)
BUN: 89 mg/dL — AB (ref 6–20)
CALCIUM: 8.8 mg/dL — AB (ref 8.9–10.3)
CHLORIDE: 98 mmol/L — AB (ref 101–111)
CO2: 32 mmol/L (ref 22–32)
CREATININE: 2.66 mg/dL — AB (ref 0.44–1.00)
GFR calc non Af Amer: 17 mL/min — ABNORMAL LOW (ref >60)
GFR, EST AFRICAN AMERICAN: 20 mL/min — AB (ref >60)
Glucose, Bld: 250 mg/dL — ABNORMAL HIGH (ref 65–99)
Phosphorus: 3.1 mg/dL (ref 2.5–4.6)
Potassium: 4.3 mmol/L (ref 3.5–5.1)
SODIUM: 137 mmol/L (ref 135–145)

## 2017-09-04 LAB — CBC
HEMATOCRIT: 35.6 % — AB (ref 36.0–46.0)
HEMOGLOBIN: 10.8 g/dL — AB (ref 12.0–15.0)
MCH: 29.3 pg (ref 26.0–34.0)
MCHC: 30.3 g/dL (ref 30.0–36.0)
MCV: 96.7 fL (ref 78.0–100.0)
Platelets: 149 10*3/uL — ABNORMAL LOW (ref 150–400)
RBC: 3.68 MIL/uL — ABNORMAL LOW (ref 3.87–5.11)
RDW: 14.4 % (ref 11.5–15.5)
WBC: 17.7 10*3/uL — ABNORMAL HIGH (ref 4.0–10.5)

## 2017-09-04 LAB — PROTIME-INR
INR: 3.37
Prothrombin Time: 33.9 seconds — ABNORMAL HIGH (ref 11.4–15.2)

## 2017-09-04 LAB — GLUCOSE, CAPILLARY
Glucose-Capillary: 192 mg/dL — ABNORMAL HIGH (ref 65–99)
Glucose-Capillary: 208 mg/dL — ABNORMAL HIGH (ref 65–99)
Glucose-Capillary: 277 mg/dL — ABNORMAL HIGH (ref 65–99)
Glucose-Capillary: 359 mg/dL — ABNORMAL HIGH (ref 65–99)

## 2017-09-04 MED ORDER — WARFARIN SODIUM 2.5 MG PO TABS
2.5000 mg | ORAL_TABLET | ORAL | Status: AC
  Administered 2017-09-04: 2.5 mg via ORAL
  Filled 2017-09-04: qty 1

## 2017-09-04 MED ORDER — WARFARIN SODIUM 2.5 MG PO TABS
2.5000 mg | ORAL_TABLET | Freq: Once | ORAL | Status: DC
  Filled 2017-09-04: qty 1

## 2017-09-04 NOTE — Progress Notes (Signed)
Family Medicine Teaching Service Daily Progress Note Intern Pager: [phone]  Patient name: Sylvia Craig Medical record number: [id number] Date of birth: 1951-11-30 Age: 66 y.o. Gender: female  Primary Care Provider: Lovena Craig, [name], MD Consultants: Nephrology Code Status: FULL  Sepsis secondary to UTI, resolving:  UCx prior to admission with E.Coli, pan-sensitive. Has been de-escalated to PO Keflex. On day 9/14 antibiotics. Blood cultures with no growth final. Repeat UCx on admission negative. Vital signs stable (on chronic 2L [place] at home). Patient asymptomatic. Leukocytosis improving, 22 > 17 today.Stable for discharge pending SNF placement.   Non-cardiac Chest Pain:  Likely MSK in nature. Improved with Tylenol.   OSA: CPAP  - Use CPAP at night- do not place on BiPAP unless definite indication (somnolence suggesting hypercapnia, respiratory distress, etc).  - Needs new home CPAP  T2DM: A1c was 9.8% in July 2018. Home regimen with Lantus 18 units qHS and Humalog 4 TID.  --CBG's ~200 on sensitive SSI & Lantus of10 U, appears to have only gotten 1 dose of sliding scale insulin yesterday despite hyperglycemia. Will need to address with nursing staff.  --continue to monitor CBG qac, qhs  Acute on Chronic Renal Failure, improving: CKD, stage 3. Cr somewhat improved today at 2.66 from 3.21. May be progressing to stage IV. Nephrology on board, appreciate assistance.  - Continue home lasix of 40 mg   Atrial fibrillation::  Transitioned to warfarin given renal function. INR supra-therapeutic at 4.86 > 3.37 today. Follow CBC in am. Holding warfain today.  --Continue metoprolol 100 mg daily --Coumadin per pharmacy  Acute on chronic systolic and diastolic HF:  Weight stable at 265 lbs today. Unclear dry weight. --Daily wts and I/O's - Continue to diurese with home lasix   Leg wounds and developing buttock ulcer: Bilateral LE's with stigmata of chronic venous stasis. - Wound care  recommend to cleanse buttocks with soap and water and pat dry. Apply Gerhardt butt cream twice daily. Keep skin clean and dry.  --Continue wound care  -- Seen by plastic surgery for left hand wound- continue wound care  FEN/GI: HH/Carb mod PPx: Warfain  Disposition: SNF  Subjective:  Doing well this morning without complaints.   Objective: Temp:  [97.7 F (36.5 C)-98.5 F (36.9 C)] 98 F (36.7 C) (09/09 1135) Pulse Rate:  [78-102] 82 (09/09 1135) Resp:  [16-24] 20 (09/09 1135) BP: (118-145)/(58-122) 125/70 (09/09 1135) SpO2:  [94 %-100 %] 98 % (09/09 1135) Weight:  [265 lb 6.4 oz (120.4 kg)] 265 lb 6.4 oz (120.4 kg) (09/09 0404) Physical Exam: General: obese female sitting up in bed in no acute distress Cardiovascular: Irregular rhythm, normal rate, no m/r/g Respiratory: CTAB Abdomen: soft, non-tender, BS+ Extremities: diffuse bruising on left arm, left hand and left lower leg wrapped in bandaging  Laboratory:  Recent Labs Lab 09/01/17 0529 09/02/17 0358 09/04/17 0212  WBC 23.6* 22.6* 17.7*  HGB 11.8* 11.6* 10.8*  HCT 37.1 37.1 35.6*  PLT 153 151 149*    Recent Labs Lab 09/02/17 0358 09/03/17 0549 09/04/17 0212  NA 140 139 137  K 4.2 4.2 4.3  CL 100* 99* 98*  CO2 29 30 32  BUN 107* 97* 89*  CREATININE 3.79* 3.21* 2.66*  CALCIUM 9.1 9.0 8.8*  GLUCOSE 165* 133* 250[name]*    Sylvia Cutrona, MD PGY-3 09/04/2017, 11:48 AM FPTS Intern pager: [phone], text pages welcome

## 2017-09-04 NOTE — Progress Notes (Signed)
Placed patient on CPAP  Previous settings of 10.0 cm H20 with 5 Lpm O2 bleed in.

## 2017-09-04 NOTE — Progress Notes (Signed)
ANTICOAGULATION CONSULT NOTE - Follow Up Consult  Pharmacy Consult for  warfarin Indication: atrial fibrillation  Allergies  Allergen Reactions  . Glucosamine Shortness Of Breath  . Shellfish-Derived Products Shortness Of Breath  . Codeine Other (See Comments)    Rash, edema  . Atorvastatin Other (See Comments)    Myalgias   . Lisinopril Other (See Comments)    Cough   . Other Other (See Comments)    Crab, chest tightness  . Oxycodone-Aspirin Other (See Comments)    Rash, dreams    Patient Measurements: Height: 5\' 5"  (165.1 cm) Weight: 265 lb 6.4 oz (120.4 kg) IBW/kg (Calculated) : 57 Heparin Dosing Weight: 88 kg  Vital Signs: Temp: 98.2 F (36.8 C) (09/09 0832) Temp Source: Oral (09/09 0832) BP: 135/79 (09/09 0832) Pulse Rate: 96 (09/09 0832)  Labs:  Recent Labs  09/02/17 0358 09/02/17 0929 09/02/17 1433 09/02/17 2025 09/03/17 0549 09/04/17 0212  HGB 11.6*  --   --   --   --  10.8*  HCT 37.1  --   --   --   --  35.6*  PLT 151  --   --   --   --  149*  APTT 120*  --   --   --   --   --   LABPROT 32.3*  --   --   --  45.1* 33.9*  INR 3.17  --   --   --  4.86* 3.37  HEPARINUNFRC 0.65  --   --   --   --   --   CREATININE 3.79*  --   --   --  3.21* 2.66*  TROPONINI  --  0.10* 0.09* 0.07*  --   --     Estimated Creatinine Clearance: 26.7 mL/min (A) (by C-G formula based on SCr of 2.66 mg/dL (H)).  Assessment: 66 y.o. female with a history of Afib on Eliquis (last dose was 8/30). Due to AKI she is now on warfarin (heparin discontinued 9/7). INR down from 4.86 to 3.37 (supratherapeutic) after holding two doses. Drug interaction noted between warfarin and levothyroxine. Better food intake yesterday. Per RN, patient had dried blood in nostrils, likely due to oxygen drying nose out. No other signs or symptoms of bleeding.  Goal of Therapy:  INR= 2-3 Monitor platelets by anticoagulation protocol: Yes   Plan:  Warfarin 2.5 mg PO x 1 Daily INR, CBCs F/u s/sx  bleeding, PO intake, DDI  Donnella Biyler Etheline Geppert, PharmD PGY1 Acute Care Pharmacy Resident Pager: [phone] 09/04/2017 8:42 AM

## 2017-09-04 NOTE — Progress Notes (Signed)
Assessment:  1 CKD 3(cr 1.5 on 07/24/17) 2 AKI, hemodynamically mediated & CIN &CHF/afib  Plan: 1Supportive 2 Follow renal fct   Subjective: Interval History: Much better, more alert  Objective: Vital signs in last 24 hours: Temp:  [97.7 F (36.5 C)-98.5 F (36.9 C)] 98 F (36.7 C) (09/09 1135) Pulse Rate:  [78-102] 82 (09/09 1135) Resp:  [16-24] 20 (09/09 1135) BP: (118-145)/(58-122) 125/70 (09/09 1135) SpO2:  [94 %-100 %] 98 % (09/09 1135) Weight:  [120.4 kg (265 lb 6.4 oz)] 120.4 kg (265 lb 6.4 oz) (09/09 0404) Weight change: 0.181 kg (6.4 oz)  Intake/Output from previous day: 09/08 0701 - 09/09 0700 In: 240 [P.O.:240] Out: 1300 [Urine:1300] Intake/Output this shift: Total I/O In: 120 [P.O.:120] Out: -   General appearance: alert and cooperative Resp: clear to auscultation bilaterally Chest wall: no tenderness Cardio: regular rate and rhythm, S1, S2 normal, no murmur, click, rub or gallop Extremities: edema 1+ bilat  Lab Results:  Recent Labs  09/02/17 0358 09/04/17 0212  WBC 22.6* 17.7*  HGB 11.6* 10.8*  HCT 37.1 35.6*  PLT 151 149*   BMET:  Recent Labs  09/03/17 0549 09/04/17 0212  NA 139 137  K 4.2 4.3  CL 99* 98*  CO2 30 32  GLUCOSE 133* 250*  BUN 97* 89*  CREATININE 3.21* 2.66*  CALCIUM 9.0 8.8*   No results for input(s): PTH in the last 72 hours. Iron Studies: No results for input(s): IRON, TIBC, TRANSFERRIN, FERRITIN in the last 72 hours. Studies/Results: No results found.  Scheduled: . cephALEXin  250 mg Oral Q12H  . cholecalciferol  1,000 Units Oral QHS  . collagenase   Topical Daily  . Ferrous Fumarate  1 tablet Oral BID WC  . furosemide  40 mg Oral Daily  . Gerhardt's butt cream   Topical BID  . insulin aspart  0-5 Units Subcutaneous QHS  . insulin aspart  0-9 Units Subcutaneous TID WC  . insulin glargine  10 Units Subcutaneous QHS  . levothyroxine  137 mcg Oral QAC breakfast  . mouth rinse  15 mL Mouth Rinse BID  .  metoprolol tartrate  50 mg Oral BID  . multivitamin with minerals  1 tablet Oral Daily  . rosuvastatin  10 mg Oral q1800  . sodium chloride flush  3 mL Intravenous Q12H  . vitamin C  1,000 mg Oral QHS  . warfarin  2.5 mg Oral ONCE-1800  . Warfarin - Pharmacist Dosing Inpatient   Does not apply q1800  . zinc sulfate  220 mg Oral Daily      LOS: 10 days   Sylvia Craig C 09/04/2017,2:04 PM

## 2017-09-05 ENCOUNTER — Encounter: Payer: Medicare Other | Admitting: Occupational Therapy

## 2017-09-05 ENCOUNTER — Ambulatory Visit: Payer: Medicare Other | Admitting: Physical Therapy

## 2017-09-05 LAB — GLUCOSE, CAPILLARY
GLUCOSE-CAPILLARY: 209 mg/dL — AB (ref 65–99)
GLUCOSE-CAPILLARY: 206 mg/dL — AB (ref 65–99)
Glucose-Capillary: 202 mg/dL — ABNORMAL HIGH (ref 65–99)

## 2017-09-05 LAB — RENAL FUNCTION PANEL
ALBUMIN: 2.1 g/dL — AB (ref 3.5–5.0)
Anion gap: 8 (ref 5–15)
BUN: 73 mg/dL — AB (ref 6–20)
CALCIUM: 8.9 mg/dL (ref 8.9–10.3)
CO2: 33 mmol/L — ABNORMAL HIGH (ref 22–32)
CREATININE: 2.18 mg/dL — AB (ref 0.44–1.00)
Chloride: 98 mmol/L — ABNORMAL LOW (ref 101–111)
GFR calc Af Amer: 26 mL/min — ABNORMAL LOW (ref >60)
GFR, EST NON AFRICAN AMERICAN: 22 mL/min — AB (ref >60)
Glucose, Bld: 233 mg/dL — ABNORMAL HIGH (ref 65–99)
PHOSPHORUS: 2.5 mg/dL (ref 2.5–4.6)
Potassium: 4.1 mmol/L (ref 3.5–5.1)
Sodium: 139 mmol/L (ref 135–145)

## 2017-09-05 LAB — PROTIME-INR
INR: 3.04
PROTHROMBIN TIME: 31.3 s — AB (ref 11.4–15.2)

## 2017-09-05 MED ORDER — INSULIN GLARGINE 100 UNIT/ML ~~LOC~~ SOLN: 12.0000 [IU] | Freq: Every day | SUBCUTANEOUS | 11 refills | Status: DC

## 2017-09-05 MED ORDER — SENNOSIDES-DOCUSATE SODIUM 8.6-50 MG PO TABS: 1.0000 | ORAL_TABLET | Freq: Every evening | ORAL | 0 refills | Status: DC | PRN

## 2017-09-05 MED ORDER — INSULIN GLARGINE 100 UNIT/ML ~~LOC~~ SOLN
12.0000 [IU] | Freq: Every day | SUBCUTANEOUS | Status: DC
  Filled 2017-09-05: qty 0.12

## 2017-09-05 MED ORDER — INSULIN ASPART 100 UNIT/ML ~~LOC~~ SOLN: 0.0000 [IU] | Freq: Three times a day (TID) | SUBCUTANEOUS | Status: DC

## 2017-09-05 MED ORDER — GERHARDT'S BUTT CREAM: 1.0000 "application " | TOPICAL_CREAM | Freq: Two times a day (BID) | CUTANEOUS | 0 refills | Status: DC

## 2017-09-05 MED ORDER — WARFARIN SODIUM 2.5 MG PO TABS: 2.5000 mg | ORAL_TABLET | Freq: Every day | ORAL | 11 refills | Status: DC

## 2017-09-05 MED ORDER — COLLAGENASE 250 UNIT/GM EX OINT: TOPICAL_OINTMENT | Freq: Every day | CUTANEOUS | 0 refills | Status: DC

## 2017-09-05 MED ORDER — WARFARIN SODIUM 2.5 MG PO TABS
2.5000 mg | ORAL_TABLET | Freq: Once | ORAL | Status: AC
  Administered 2017-09-05: 2.5 mg via ORAL
  Filled 2017-09-05: qty 1

## 2017-09-05 MED ORDER — CEPHALEXIN 250 MG PO CAPS: 250.0000 mg | ORAL_CAPSULE | Freq: Two times a day (BID) | ORAL | 0 refills | Status: AC

## 2017-09-05 NOTE — Clinical Social Work Placement (Signed)
   CLINICAL SOCIAL WORK PLACEMENT  NOTE  Date:  09/05/2017  Patient Details  Name: Sylvia Craig MRN: [id number] Date of Birth: 02-26-1951  Clinical Social Work is seeking post-discharge placement for this patient at the Skilled  Nursing Facility level of care (*CSW will initial, date and re-position this form in  chart as items are completed):  Yes   Patient/family provided with [place] Clinical Social Work Department's list of facilities offering this level of care within the geographic area requested by the patient (or if unable, by the patient's family).  Yes   Patient/family informed of their freedom to choose among providers that offer the needed level of care, that participate in Medicare, Medicaid or managed care program needed by the patient, have an available bed and are willing to accept the patient.  Yes   Patient/family informed of [facility]'s ownership interest in [facility] and [facility], as well as of the fact that they are under no obligation to receive care at these facilities.  PASRR submitted to EDS on 09/03/17     PASRR number received on 09/03/17     Existing PASRR number confirmed on       FL2 transmitted to all facilities in geographic area requested by pt/family on 09/02/17     FL2 transmitted to all facilities within larger geographic area on       Patient informed that his/her managed care company has contracts with or will negotiate with certain facilities, including the following:  [facility]     Yes   Patient/family informed of bed offers received.  [facility]     Physician recommends and patient chooses bed at      [facility].  Patient to be transferred to facility by PTAR     Patient family notified on 09/05/17 of transfer.  Name of family member notified:  Lenny PastelMatthew Hollinshead     PHYSICIAN Please sign FL2     Additional Comment:     _______________________________________________ Abigail ButtsSusan Zian Delair, LCSW 09/05/2017, 3:02 PM

## 2017-09-05 NOTE — Progress Notes (Signed)
[facility], nurse phone# given to facility.

## 2017-09-05 NOTE — Progress Notes (Signed)
Physical Therapy Treatment Patient Details Name: Sylvia Craig MRN: [id number] DOB: [birth year] Today's Date: 09/05/2017    History of Present Illness Pt adm with sepsis secondary to UTI. PMH - HTN, DM, [name], afib, chf    PT Comments    Pt making good progress. Motivated to work toward return to independence.   Follow Up Recommendations  SNF     Equipment Recommendations  None recommended by PT    Recommendations for Other Services       Precautions / Restrictions Precautions Precautions: Fall    Mobility  Bed Mobility Overal bed mobility: Needs Assistance Bed Mobility: Supine to Sit     Supine to sit: Mod assist     General bed mobility comments: Assist to elevated trunk into sitting.  Transfers Overall transfer level: Needs assistance Equipment used: Rolling walker (2 wheeled) Transfers: Sit to/from Stand Sit to Stand: Mod assist         General transfer comment: Assist to bring hips and trunk up. Verbal cues for hand placement  Ambulation/Gait Ambulation/Gait assistance: Min assist Ambulation Distance (Feet): 12 Feet (x 2) Assistive device: Rolling walker (2 wheeled) Gait Pattern/deviations: Step-through pattern;Decreased step length - right;Decreased step length - left;Shuffle;Trunk flexed Gait velocity: decr Gait velocity interpretation: Below normal speed for age/gender General Gait Details: Assist for balance and support and to bring chair behind. Pt with tremulous, unsteady gait. Pt fatigues quickly. Amb on 2L O2 with SpO2 >95%.   Stairs            Wheelchair Mobility    Modified Rankin (Stroke Patients Only)       Balance Overall balance assessment: Needs assistance Sitting-balance support: No upper extremity supported;Feet supported Sitting balance-Leahy Scale: Fair     Standing balance support: Bilateral upper extremity supported Standing balance-Leahy Scale: Poor Standing balance comment: Walker and min assist for static  standing.                             Cognition Arousal/Alertness: Awake/alert Behavior During Therapy: Flat affect Overall Cognitive Status: Impaired/Different from baseline Area of Impairment: Problem solving                             Problem Solving: Slow processing;Requires verbal cues General Comments: Slightly slowed processing but much improved since eval      Exercises      General Comments        Pertinent Vitals/Pain      Home Living                      Prior Function            PT Goals (current goals can now be found in the care plan section) Progress towards PT goals: Progressing toward goals    Frequency    Min 3X/week      PT Plan Current plan remains appropriate    Co-evaluation              AM-PAC PT "6 Clicks" Daily Activity  Outcome Measure  Difficulty turning over in bed (including adjusting bedclothes, sheets and blankets)?: Unable Difficulty moving from lying on back to sitting on the side of the bed? : Unable Difficulty sitting down on and standing up from a chair with arms (e.g., wheelchair, bedside commode, etc,.)?: Unable Help needed moving to and from a bed to chair (including a  wheelchair)?: Total Help needed walking in hospital room?: Total Help needed climbing 3-5 steps with a railing? : Total 6 Click Score: 6    End of Session Equipment Utilized During Treatment: Oxygen;Gait belt Activity Tolerance: Patient limited by fatigue Patient left: with call bell/phone within reach;in chair Nurse Communication: Mobility status PT Visit Diagnosis: Unsteadiness on feet (R26.81);Other abnormalities of gait and mobility (R26.89);Muscle weakness (generalized) (M62.81)     Time: 1610-9604 PT Time Calculation (min) (ACUTE ONLY): 30 min  Charges:  $Gait Training: 23-37 mins                    G Codes:       [facility] PT [phone]    Angelina Ok [facility] 09/05/2017, 2:15 PM

## 2017-09-05 NOTE — Progress Notes (Signed)
Family Medicine Teaching Service Daily Progress Note Intern Pager: [phone]  Patient name: Sylvia Craig Medical record number: [id number] Date of birth: 17-Dec-1951 Age: 66 y.o. Gender: female  Primary Care Provider: Lovena Neighboursiallo, [name], MD Consultants: Nephrology Code Status: FULL  Pt Overview and Major Events to Date:  Sylvia Craig a 66 y.o.femalewith medical history significant forhypertension, hypothyroidism, hyperlipidemia, [name], insulin-dependent diabetes mellitus, [name], now presenting for evaluation of left shoulder ache, weakness, [name], [name]   Assessment and Plan:  Sepsis secondary to UTI, resolving:  UCx prior to admission with E.Coli, pan-sensitive. Blood cultures with no growth final. Repeat UCx on admission negative. Vital signs stable (on chronic 2L [place] at home). Patient asymptomatic. Stable for discharge pending SNF placement.  - Continue PO Keflex 250 mg BID (9/06-) last dose on 09/09/2017. - Leukocytosis improving, 22 > 17 on 09/09.  Non-cardiac Chest Pain:  Likely MSK in nature. Improved with Tylenol.   OSA: CPAP  - Use CPAP at night- do not place on BiPAP unless definite indication  - Needs new home CPAP- SW aware  T2DM: A1c was 9.8% in July 2018. Home regimen with Lantus 18 units qHS and Humalog 4 TID.  --CBG's ~230-359, received 13 U of sensitive SSI & Lantus of10 U, will increase Lantus to 12U  --continue to monitor CBG qac, qhs  Acute on Chronic Renal Failure, improving: CKD, stage 3. Cr peak of 3.96 on 09/04> 2.18 on 09/10. - Nephrology recs appreciated   - Continue home lasix of 40 mg   Hypercapnia: Chronic. Likely due to OSA with chronic hypercapnic state (Bicarb was 33 in 07/30/17) and was decreased due to Urosepsis. Bicarb is 33 on 09/10. - Patient on 2L [place] during day- home oxygen requirement, and on CPAP qHs - On  home oxygen requirement that was started 07/30/2017, continue CPAP.  Atrial fibrillation::  Transitioned to warfarin given renal function. INR supra-therapeutic at 3.04 on 09/10. Follow CBC in am.  --Continue metoprolol 100 mg daily --Coumadin per pharmacy  Acute on chronic systolic and diastolic HF: Weight on admission 264> weight stable at 262 lbs on 09/10. Unclear dry weight. - Daily wts and I/O's - Continue to diurese with home lasix   Leg wounds and developing buttock ulcer: Bilateral LE's with stigmata of chronic venous stasis. - Wound care recommend to cleanse buttocks with soap and water and pat dry. Apply Gerhardt butt cream twice daily. Keep skin clean and dry.  -- Continue wound care  -- Seen by plastic surgery for left hand wound- continue wound care  Peripheral Vascular Disease: ABIs with likely PAD. ABI- R: Non compressible; L: 0.85. Feet are warm & well perfused today.  - On aspirin & statin - Recommend outpatient follow up with vascular surgery once more stable.   FEN/GI: HH/Carb mod PPx: Warfain  Disposition: SNF  Subjective:  Patient sitting up and eating breakfast, says she feels great and is ready to go get therapy because she doesn't want to be back here.   Objective: Temp:  [97.7 F (36.5 C)-98.2 F (36.8 C)] 97.7 F (36.5 C) (09/10 0437) Pulse Rate:  [82-110] 110 (09/09 2300) Resp:  [12-22] 12 (09/10 0437) BP: (125-149)/(67-91) 145/91 (09/09 2300) SpO2:  [94 %-99 %] 94 % (09/09 2202) Weight:  [262 lb 4.8 oz (119 kg)] 262 lb 4.8 oz (119 kg) (09/10 0436)  Physical Exam: General: obese female sitting up in bed in no acute distress Cardiovascular: Irregular rhythm, normal rate, no m/r/g Respiratory: Mild wheezing noted at bases, normal work of breahting Abdomen: soft, non-tender, BS+ Extremities: diffuse bruising on left arm, left hand and left lower leg wrapped in bandaging, BLLE with +1 pitting edema  Laboratory:  Recent Labs Lab  09/01/17 0529 09/02/17 0358 09/04/17 0212  WBC 23.6* 22.6* 17.7*  HGB 11.8* 11.6* 10.8*  HCT 37.1 37.1 35.6*  PLT 153 151 149*    Recent Labs Lab 09/03/17 0549 09/04/17 0212 09/05/17 0128  NA 139 137 139  K 4.2 4.3 4.1  CL 99* 98* 98*  CO2 30 32 33*  BUN 97* 89* 73*  CREATININE 3.21* 2.66* 2.18*  CALCIUM 9.0 8.8* 8.9  GLUCOSE 133* 250* 233*    Sylvia Craig, [place], DO 09/05/2017, 9:16 AM PGY-1, [place] [facility] FPTS Intern pager: [phone], text pages welcome

## 2017-09-05 NOTE — Progress Notes (Signed)
[facility]

## 2017-09-05 NOTE — Progress Notes (Signed)
Assessment:  1 CKD 3(cr 1.5 on 07/24/17) 2 AKI, hemodynamically mediated & CIN &CHF/afib  Plan: 1Supportive- creatinine is improving.  No need for dialysis at this time. 2 Follow renal fct   Subjective: Interval History: lying in bed, NAD.  Feeling well.  Objective: Vital signs in last 24 hours: Temp:  [97.7 F (36.5 C)-98.2 F (36.8 C)] 97.7 F (36.5 C) (09/10 0437) Pulse Rate:  [82-110] 110 (09/09 2300) Resp:  [12-22] 12 (09/10 0437) BP: (125-149)/(67-91) 145/91 (09/09 2300) SpO2:  [94 %-99 %] 94 % (09/09 2202) Weight:  [119 kg (262 lb 4.8 oz)] 119 kg (262 lb 4.8 oz) (09/10 0436) Weight change: -1.406 kg (-3 lb 1.6 oz)  Intake/Output from previous day: 09/09 0701 - 09/10 0700 In: 120 [P.O.:120] Out: 2000 [Urine:2000] Intake/Output this shift: No intake/output data recorded.  General appearance: alert and cooperative Resp: clear to auscultation bilaterally Chest wall: no tenderness Cardio: regular rate and rhythm, S1, S2 normal, no murmur, click, rub or gallop Extremities: edema 1+ bilat  Lab Results:  Recent Labs  09/04/17 0212  WBC 17.7*  HGB 10.8*  HCT 35.6*  PLT 149*   BMET:   Recent Labs  09/04/17 0212 09/05/17 0128  NA 137 139  K 4.3 4.1  CL 98* 98*  CO2 32 33*  GLUCOSE 250* 233*  BUN 89* 73*  CREATININE 2.66* 2.18*  CALCIUM 8.8* 8.9   No results for input(s): PTH in the last 72 hours. Iron Studies: No results for input(s): IRON, TIBC, TRANSFERRIN, FERRITIN in the last 72 hours. Studies/Results: No results found.  Scheduled: . cephALEXin  250 mg Oral Q12H  . cholecalciferol  1,000 Units Oral QHS  . collagenase   Topical Daily  . Ferrous Fumarate  1 tablet Oral BID WC  . furosemide  40 mg Oral Daily  . Gerhardt's butt cream   Topical BID  . insulin aspart  0-5 Units Subcutaneous QHS  . insulin aspart  0-9 Units Subcutaneous TID WC  . insulin glargine  12 Units Subcutaneous QHS  . levothyroxine  137 mcg Oral QAC breakfast  . mouth  rinse  15 mL Mouth Rinse BID  . metoprolol tartrate  50 mg Oral BID  . multivitamin with minerals  1 tablet Oral Daily  . rosuvastatin  10 mg Oral q1800  . sodium chloride flush  3 mL Intravenous Q12H  . vitamin C  1,000 mg Oral QHS  . Warfarin - Pharmacist Dosing Inpatient   Does not apply q1800  . zinc sulfate  220 mg Oral Daily      LOS: 11 days   Sylvia Craig 09/05/2017,9:39 AM

## 2017-09-05 NOTE — Progress Notes (Signed)
[facility]. Anticipated discharge date: 09/05/17 Family notified: Lenny PastelMatthew Sole, son Transportation by: PTAR  Nurse to call report to [phone].   CSW signing off.  Abigail ButtsSusan Suriyah Vergara, LCSWA  Clinical Social Worker

## 2017-09-05 NOTE — Progress Notes (Signed)
ANTICOAGULATION CONSULT NOTE - Follow Up Consult  Pharmacy Consult for  warfarin Indication: atrial fibrillation  Allergies  Allergen Reactions  . Glucosamine Shortness Of Breath  . Shellfish-Derived Products Shortness Of Breath  . Codeine Other (See Comments)    Rash, edema  . Atorvastatin Other (See Comments)    Myalgias   . Lisinopril Other (See Comments)    Cough   . Other Other (See Comments)    Crab, chest tightness  . Oxycodone-Aspirin Other (See Comments)    Rash, dreams    Patient Measurements: Height: 5\' 5"  (165.1 cm) Weight: 262 lb 4.8 oz (119 kg) (used bed) IBW/kg (Calculated) : 57 Heparin Dosing Weight: 88 kg  Vital Signs: Temp: 97.7 F (36.5 C) (09/10 0437) Temp Source: Oral (09/10 0437)  Labs:  Recent Labs  09/02/17 1433 09/02/17 2025 09/03/17 0549 09/04/17 0212 09/05/17 0128  HGB  --   --   --  10.8*  --   HCT  --   --   --  35.6*  --   PLT  --   --   --  149*  --   LABPROT  --   --  45.1* 33.9* 31.3*  INR  --   --  4.86* 3.37 3.04  CREATININE  --   --  3.21* 2.66* 2.18*  TROPONINI 0.09* 0.07*  --   --   --     Estimated Creatinine Clearance: 32.3 mL/min (A) (by C-G formula based on SCr of 2.18 mg/dL (H)).  Assessment: 66 y.o. female with a history of Afib on Eliquis (last dose was 8/30). Due to AKI she is now on warfarin (heparin discontinued 9/7). INR continues to trend down after holding two doses. Drug interaction noted between warfarin and levothyroxine. No other signs or symptoms of bleeding.  Goal of Therapy:  INR= 2-3 Monitor platelets by anticoagulation protocol: Yes   Plan:  Warfarin 2.5 mg PO x 1 Would recommend Warfarin 2.5mg  daily on discharge with close INR follow-up. Daily INR, CBCs F/u s/sx bleeding, PO intake, DDI  Toys 'R' UsKimberly Davien Malone, Pharm.D., BCPS Clinical Pharmacist Pager: [phone] Clinical phone for 09/05/2017 from 8:30-4:00 is [phone]. After 4pm, please call Main Rx [phone]) for assistance. 09/05/2017 1:57  PM

## 2017-09-09 ENCOUNTER — Encounter: Payer: Medicare Other | Admitting: Occupational Therapy

## 2017-09-09 ENCOUNTER — Ambulatory Visit: Payer: Medicare Other | Admitting: Physical Therapy

## 2017-09-12 ENCOUNTER — Ambulatory Visit: Payer: Medicare Other | Admitting: Occupational Therapy

## 2017-09-12 ENCOUNTER — Ambulatory Visit: Payer: Medicare Other | Admitting: Physical Therapy

## 2017-09-15 ENCOUNTER — Ambulatory Visit: Payer: Medicare Other | Admitting: Physical Therapy

## 2017-09-15 ENCOUNTER — Encounter: Payer: Medicare Other | Admitting: Occupational Therapy

## 2017-09-15 ENCOUNTER — Encounter: Payer: Self-pay | Admitting: Occupational Therapy

## 2017-09-15 NOTE — Therapy (Signed)
[facility] [address] [place], [place], [address] Phone: [phone]   Fax:  [phone]  Patient Details  Name: Sylvia Craig MRN: [id number] Date of Birth: [birth year] Referring Provider:  No ref. provider found  Encounter Date: 09/15/2017  OCCUPATIONAL THERAPY DISCHARGE SUMMARY  Visits from Start of Care: 3  Current functional level related to goals / functional outcomes:     OT Short Term Goals - 08/11/17 1649      OT SHORT TERM GOAL #1   Title Pt will be independent with initial HEP for ROM and edema management techniques prn.--check STGs 09/10/17   Time 4   Period Weeks   Status New     OT SHORT TERM GOAL #2   Title Pt will demo at least 45* L wrist flex/ext for ADLs.   Baseline flex 30*, ext 20*   Time 4   Period Weeks   Status New     OT SHORT TERM GOAL #3   Title Pt will demo at least 75% gross finger flex for grasping objects.   Period Weeks   Status New     OT SHORT TERM GOAL #4   Title Pt will be able to don bra and fasten mod I.   Time 4   Period Weeks   Status New     OT SHORT TERM GOAL #5   Title Pt will demo at least 85* supination to assist with ADLs/IADLs.   Time 8   Period Weeks   Status New     Additional Short Term Goals   Additional Short Term Goals --         OT Long Term Goals - 08/11/17 1654      OT LONG TERM GOAL #1   Title Pt will be independent with strengthening HEP.--check LTGs 10/10/17   Period Weeks   Status New     OT LONG TERM GOAL #2   Title Pt be able to use LUE as non-dominant assist for ADLs/IADLs at least 90% of the time.   Time 8   Period Weeks   Status New     OT LONG TERM GOAL #3   Title Pt will demo at least 55* L wrist flex/ext for ADLs.   Time 8   Period Weeks   Status New     OT LONG TERM GOAL #4   Title Pt will perform simple cooking task mod I.   Time 8   Period Weeks   Status New     OT LONG TERM GOAL #5   Title Pt will demo at  least 30lbs L grip strength for opening containers/assist in lifting grandson.   Time 8   Period Weeks   Status New     Long Term Additional Goals   Additional Long Term Goals Yes     OT LONG TERM GOAL #6   Title Pt will perform various simple home maintenance tasks for at least 20 min with good safety/balance, LUE as nondominant assist without rest/maintaing O2 saturation of at least 90%.   Time 8   Period Weeks   Status New     Above goals not met/unable to reassess due to change in medical status/hospitalization.   Remaining deficits: See eval, pt only seen for eval +2 treatments due to change in medical status   Education / Equipment: Pt instructed in initial HEP, but unable to complete pt education due to pt not returning to outpatient OT.  Plan: Patient  agrees to discharge.  Patient goals were not met. Patient is being discharged due to a change in medical status.  Pt was hospitalized and received message that pt was going to inpatient rehab. ?????        [facility] 09/15/2017, 10:02 AM  [place] [address] [place], [place], [address] Phone: [phone]   Fax:  [place], OTR/L [facility] [address] [place], [place]  [address] [phone] phone [phone] 09/15/17 10:02 AM

## 2017-09-20 ENCOUNTER — Ambulatory Visit: Payer: Medicare Other | Admitting: Physical Therapy

## 2017-09-21 ENCOUNTER — Ambulatory Visit: Payer: Medicare Other | Admitting: Podiatry

## 2017-09-22 ENCOUNTER — Ambulatory Visit: Payer: Medicare Other | Admitting: Physical Therapy

## 2017-09-26 ENCOUNTER — Ambulatory Visit: Payer: Medicare Other | Admitting: Physical Therapy

## 2017-09-27 ENCOUNTER — Telehealth: Payer: Self-pay | Admitting: *Deleted

## 2017-09-27 NOTE — Telephone Encounter (Signed)
Patient called with questions regarding her coumadin.  Sylvia Craig with [facility] called to request verbal order for PT/INR check tomorrow.  Order given.  Will call with results.  Clovis Pu, RN

## 2017-09-28 ENCOUNTER — Telehealth: Payer: Self-pay | Admitting: Family Medicine

## 2017-09-28 ENCOUNTER — Other Ambulatory Visit: Payer: Self-pay | Admitting: *Deleted

## 2017-09-28 LAB — POCT INR: INR: 4.4

## 2017-09-28 NOTE — Telephone Encounter (Signed)
Verbal order given for OT. Left VM for [place] Crystal (occuptional therapist).  Thanks  [name], MD [facility], PGY-2

## 2017-09-28 NOTE — Telephone Encounter (Signed)
[place] from [facility] called to request verbal orders for the patient to have OT 2 times a week for 6 weeks. Please call her at  [phone]. Sylvia Craig

## 2017-09-29 ENCOUNTER — Ambulatory Visit: Payer: Medicare Other | Admitting: Physical Therapy

## 2017-09-30 ENCOUNTER — Other Ambulatory Visit: Payer: Self-pay | Admitting: *Deleted

## 2017-09-30 LAB — POCT INR: INR: 2.5

## 2017-10-03 ENCOUNTER — Ambulatory Visit: Payer: Medicare Other

## 2017-10-03 NOTE — Progress Notes (Signed)
   Subjective:   Patient ID: Sylvia Craig    DOB: [birth year], 66 y.o. female   MRN: [id number]  Sylvia Craig is a 66 y.o. female with a history of CHF, Afib, diabetes here for coumadin/INR check.  Atrial fibrillation on Coumadin - Medications: Coumadin, some descrepancy with dosing between Home Health and patient - Was discharged from the hospital 9/10 with 2.5mg  daily and INR 3.04. D/ced to SNF where she was discharged with  daily and INR 6.0 9/29. - INR today: 1.4 - Patient hasn't taken any Coumadin since came home from [facility] 9/29 due to supratherapetic INR.  Review of Systems:  Per HPI.   PMFSH: reviewed. Smoking status reviewed. Medications reviewed.  Objective:   BP 112/62   Pulse 85   Temp 98 F (36.7 C) (Oral)   SpO2 92% Comment: 2L O2 Vitals and nursing note reviewed.  General: well nourished, well developed, in no acute distress with non-toxic appearance CV: regular rate and rhythm without murmurs, rubs, or gallops Lungs: clear to auscultation bilaterally with normal work of breathing, on home O2 Skin: warm, dry, venous stasis changes to bilateral LE. Neuro: Alert and oriented, speech normal  Assessment & Plan:   Chronic atrial fibrillation (HCC) Patient in regular rhythm today, with subtherapeutic INR after holding coumadin a week and a half due to supratherapeutic INR. Previously on Eliquis but changed to Coumadin due to AKI at last admission. Goal INR 2.0-3.0. Counseled on Vitamin K rich foods and importance of keeping diet unvaried. Handout given. - restart Coumadin 2.5mg  daily. - F/u Friday 10/12 for INR check and hospital f/u.  Orders Placed This Encounter  Procedures  . Flu Vaccine QUAD 36+ mos IM  . INR   Meds ordered this encounter  Medications  . [facility]S MELATONIN 5 MG TABS    Sig: Take 1 tablet by mouth at bedtime as needed. for sleep    Refill:  0  . warfarin (COUMADIN) 5 MG tablet    Sig: TAKE 1 TABLET IN THE EVENING FOR TREATING/PREVENTING BLOOD CLOTS     Refill:  0    Ellwood Dense, DO PGY-1, [facility] 10/04/2017 1:48 PM

## 2017-10-04 ENCOUNTER — Encounter: Payer: Self-pay | Admitting: Family Medicine

## 2017-10-04 ENCOUNTER — Ambulatory Visit (INDEPENDENT_AMBULATORY_CARE_PROVIDER_SITE_OTHER): Payer: Medicare Other | Admitting: Family Medicine

## 2017-10-04 VITALS — BP 112/62 | HR 85 | Temp 98.0°F

## 2017-10-04 DIAGNOSIS — Z23 Encounter for immunization: Secondary | ICD-10-CM | POA: Diagnosis not present

## 2017-10-04 DIAGNOSIS — I482 Chronic atrial fibrillation, unspecified: Secondary | ICD-10-CM

## 2017-10-04 LAB — POCT INR: INR: 1.4

## 2017-10-04 NOTE — Assessment & Plan Note (Addendum)
Patient in regular rhythm today, with subtherapeutic INR after holding coumadin a week and a half due to supratherapeutic INR. Previously on Eliquis but changed to Coumadin due to AKI at last admission. Goal INR 2.0-3.0. Counseled on Vitamin K rich foods and importance of keeping diet unvaried. Handout given. - restart Coumadin 2.5mg  daily. - F/u Friday 10/12 for INR check and hospital f/u.

## 2017-10-04 NOTE — Patient Instructions (Signed)
It was great to see you!   - Cut your Warfarin 5 mg pills in half and take 2.5mg  every day. - We will recheck your INR on Friday. At that time, you can be seen for a hospital follow up. - Your INR today was 1.4  Take care and seek immediate care sooner if you develop any concerns.  Ellwood Dense, DO [facility]

## 2017-10-06 DIAGNOSIS — R6889 Other general symptoms and signs: Secondary | ICD-10-CM | POA: Insufficient documentation

## 2017-10-06 DIAGNOSIS — J9610 Chronic respiratory failure, unspecified whether with hypoxia or hypercapnia: Secondary | ICD-10-CM | POA: Insufficient documentation

## 2017-10-06 NOTE — Progress Notes (Signed)
Subjective:    Patient ID: [name] , female   DOB: 11/09/1951 , 66 y.o..   MRN: [id number]  HPI  Valetta Mulroy is 66 y.o.Fwith PMH of hypertension, hypothyroidism, hyperlipidemia, coronary artery disease, chronic respiratory failure (on 2L [place] at home),  insulin-dependent diabetes mellitus, and atrial fibrillation on coumadin here for  Chief Complaint  Patient presents with  . Hospitalization Follow-up    1. Hospital Follow Up: Hospitalized from 8/30-9/10. Briefly, patient was hospitalized for sepsis secondary to UTI. IV Vanc and Zosyn given. Transitioned to PO Keflex when urine cultures grew E. Coli that was pan sensitive  Additionally during hospitalization, she was switched to coumadin from eliquis for anticoagulation for a fib (switched due to AKI). Baseline Cr 1.7.   Required BiPAP for hypercapnia but then transitioned to her home 2 L O2.   Had some signs of fluid overload from HFpEF, was diuresed with IV lasix x 3 days and transitioned to PO Lasix 40 mg daily by DC. Was discharged from hospital to SNF [facility] for 21 days). Now she is home with home health PT/OT/RN 3 times a day.   Since DC, patient followed up in the clinic for INR check. INR was supratherapeutic at one point and coumadin held for a week. On 10/09 she was seen in our clinic and Coumadin resumed to 2.5 mg daily for subtherapeutic INR. She is coming in today for a repeat INR check. She has completed her course of Keflex (finished on 9/14).   Sleep study scheduled for the end of November so she can get her CPAP symptoms. Has just been using her 2 L  24 hours a day. Glucose in AM usually between 100-130. PM Glucose usually in the 100's and low 200's.   Denies any fever, chest pain, shortness of breath, palpitations, dysuria. Is having lower extremity edema.   Review of Systems: Per HPI.   Past Medical History: Patient Active Problem List   Diagnosis Date Noted  . Edema 10/09/2017  . CKD  (chronic kidney disease) stage 3, GFR 30-59 ml/min (HCC) 10/09/2017  . Type 2 diabetes mellitus (HCC) 10/07/2017  . Abnormal ankle brachial index (ABI) 10/06/2017  . Chronic respiratory failure (HCC) 10/06/2017  . Chronic atrial fibrillation (HCC) 10/04/2017  . Coronary artery disease involving native coronary artery of native heart with angina pectoris (HCC) 08/25/2017  . UTI (urinary tract infection) 08/25/2017  . Multiple open wounds of lower leg 08/25/2017  . Unsteady gait 08/11/2017  . Hypoxemia   . Obstructive sleep apnea   . Necrotic wound of left hand (HCC)   . Morbid obesity (HCC) 07/26/2017  . (HFpEF) heart failure with preserved ejection fraction (HCC) 01/16/2016  . Anemia, iron deficiency 11/16/2014    Medications: reviewed and updated Current Outpatient Prescriptions  Medication Sig Dispense Refill  . acetaminophen (TYLENOL) 500 MG tablet Take 1,000 mg by mouth every 6 (six) hours as needed for moderate pain or headache.     . Cholecalciferol (VITAMIN D-1000 MAX ST) 1000 units tablet Take 1 tablet (1,000 Units total) by mouth at bedtime. 30 tablet 1  . collagenase (SANTYL) ointment Apply topically daily. 15 g 0  . [employer] MELATONIN 5 MG TABS Take 1 tablet (5 mg total) by mouth at bedtime as needed. for sleep 90 tablet 1  . Ferrous Fumarate (HEMOCYTE - 106 MG FE) 324 (106 Fe) MG TABS tablet Take 1 tablet (106 mg of iron total) by mouth 2 (two) times daily. 30 tablet 1  .  furosemide (LASIX) 40 MG tablet Take 1 tablet (40 mg total) by mouth daily. 30 tablet 3  . Hydrocortisone (GERHARDT'S BUTT CREAM) CREA Apply 1 application topically 2 (two) times daily. 1 each 0  . insulin aspart (NOVOLOG) 100 UNIT/ML injection Inject 0-9 Units into the skin 3 (three) times daily with meals.    . insulin glargine (LANTUS) 100 UNIT/ML injection Inject 0.12 mLs (12 Units total) into the skin at bedtime. 10 mL 11  . levothyroxine (SYNTHROID, LEVOTHROID) 137 MCG tablet Take 1 tablet (137 mcg total)  by mouth daily before breakfast. 90 tablet 0  . metoprolol succinate (TOPROL-XL) 100 MG 24 hr tablet Take 1 tablet (100 mg total) by mouth daily. 30 tablet 3  . Multiple Vitamin (MULTIVITAMIN WITH MINERALS) TABS tablet Take 1 tablet by mouth daily. 30 tablet 3  . nitroGLYCERIN (NITROSTAT) 0.4 MG SL tablet Take 0.4 mg under the toungue as needed for chest pain 30 tablet 1  . rosuvastatin (CRESTOR) 10 MG tablet Take 1 tablet (10 mg total) by mouth at bedtime. 90 tablet 0  . senna-docusate (SENOKOT-S) 8.6-50 MG tablet Take 1 tablet by mouth at bedtime as needed for mild constipation. 30 tablet 0  . vitamin C (ASCORBIC ACID) 500 MG tablet Take 2 tablets (1,000 mg total) by mouth at bedtime. 90 tablet 1  . warfarin (COUMADIN) 2.5 MG tablet Take 1 tablet (2.5 mg total) by mouth daily at 6 PM. 30 tablet 11  . zinc sulfate 220 (50 Zn) MG capsule Take 1 capsule (220 mg total) by mouth daily. 30 capsule 3   No current facility-administered medications for this visit.     Social Hx:  reports that she quit smoking about 29 years ago. Her smoking use included Cigarettes. She smoked 1.00 pack per day. She has never used smokeless tobacco.   Objective:   BP 119/80 (BP Location: Right Arm, Patient Position: Sitting, Cuff Size: Large)   Pulse (!) 116   Temp (!) 97.5 F (36.4 C) (Oral)   Ht  (1.651 m)   Wt 264 lb 12.8 oz (120.1 kg)   SpO2 95%   BMI 44.07 kg/m  Physical Exam  Gen: NAD, alert, cooperative with exam, well-appearing, obese, wheelchair bound HEENT: NCAT, PERRL, clear conjunctiva, oropharynx clear, supple neck Cardiac: Irregular rhythm, normal rate. normal S1/S2, no murmur, 1+ pitting edema in bilateral legs from knee down, capillary refill brisk  Respiratory: [place] on nose. Clear to auscultation bilaterally, no wheezes, non-labored breathing Gastrointestinal: soft, non tender, non distended, bowel sounds present Skin: erythematous bilateral legs from knee down, wheeping clear fluid. Left  shin has ~2cm area of fluid under skin, not painful. Ulcer on posterior left calf, no signs of surrounding erythema, fluctuance, or necrosis.  Neurological: Alert and oriented Psych: good insight, normal mood and affect        Assessment & Plan:   HOSPITAL FOLLOW UP: Patient overall doing well since hospital DC last month (in hospital for sepsis from UTI). Stayed at a SNF for 21 days and now back home with PT/OT/and RN support. She has multiple medical issues that need to be more thoroughly addressed at future visits. She has not yet had an official new patient visit to establish care with [place].  -Asked patient to follow up with PCP Dr. Sydnee Cabal in 2-3 weeks, can consider doing new patient visit at that appt if time -Will need to obtain previous PCP records -Provided refills on all medications -Consider scheduling visit with Lauren for AWV  in future   Type 2 diabetes mellitus (HCC) Uncontrolled based off of last Hemoglobin A1c of 9.7 07/25/2017. Fasting glucose is acceptable. Glucose in PM uncontrolled and will likely need further titration of sliding scale in the future. She is doing an excellent job at keeping track of her glucose values.  -Continue 12 units of Lantus daily at bedtime -Continue sliding scale insulin coverage -Follow-up in 2-3 weeks with PCP Dr. Sydnee Cabal for diabetes management and A1c  Chronic respiratory failure (HCC) On 2 L O2 nasal cannula chronically. Unclear why she has chronic respiratory failure, no COPD but does have OSA and hx of tobacco abuse. No signs of hypoxia today. -Continue 2 L nasal cannula day and night for now -Has sleep study scheduled at the end of November for new CPAP settings   UTI (urinary tract infection) Resolved.  Completed PO Keflex course. No symptoms of UTI today  Edema Edematous legs with chronic vascular changes. Reassuringly patient has no signs of CHF exacerbation with clear lungs and normal work of breathing. Legs from knees down are  erythematous and weeping clear fluid. Admits she has not been elevating her legs.  - Continue current dose of Lasix - Encouraged leg elevation and compression stockings - Return precautions discussed  Chronic atrial fibrillation (HCC) Irregular rhythm today but rate controlled. Anticoagulation with coumadin secondary to worsening renal function while hospitalized (apprently used to be on Eliquis) Subtherapeutic INR of 1.4 today (Goal INR 2-3).  - Increase Coumadin from 2.5 mg daily to 5 mg daily.  - Follow up in 2 days for repeat INR check - Patient does not have a cardiologist in [place], referral placed  CKD (chronic kidney disease) stage 3, GFR 30-59 ml/min (HCC) Had AKI while in the hospital. Apparently baseline Cr is 1.7. Today it is 1.6.  - Continue routine BMPs - Consider referral to nephrology is she isn't already seeing a nephrologist   Abnormal ankle brachial index (ABI) Had abnormal ABI while in the hospital. 09/01/17: ABIs with likely PAD. ABI- R: Non compressible;L: 0.85.  - Can consider referral to vascular surgery if patient would like to pursue that, but did not discuss this at visit today due to several other items of importance that needed to be discussed  (HFpEF) heart failure with preserved ejection fraction (HCC) Does have 1+ pitting edema in bilateral extremities and signs of vascular insufficiency but otherwise no signs of volume overload/CHF exacerbation.  - Continue Lasix 40 mg daily - Continue Metoprolol Succinate 100 mg daily - Referral placed for cardiology  Orders Placed This Encounter  Procedures  . CBC with Differential  . Comprehensive metabolic panel    Order Specific Question:   Has the patient fasted?    Answer:   No  . Ambulatory referral to Cardiology    Referral Priority:   Routine    Referral Type:   Consultation    Referral Reason:   Specialty Services Required    Requested Specialty:   Cardiology    Number of Visits Requested:   1  .  POCT INR  . POCT INR    This back office order was created through the Results Console.    Anders Simmonds, MD [facility], PGY-3

## 2017-10-07 ENCOUNTER — Ambulatory Visit: Payer: Medicare Other | Admitting: Family Medicine

## 2017-10-07 ENCOUNTER — Encounter: Payer: Self-pay | Admitting: Family Medicine

## 2017-10-07 ENCOUNTER — Ambulatory Visit (INDEPENDENT_AMBULATORY_CARE_PROVIDER_SITE_OTHER): Payer: Medicare Other | Admitting: Family Medicine

## 2017-10-07 VITALS — BP 119/80 | HR 116 | Temp 97.5°F | Ht 65.0 in | Wt 264.8 lb

## 2017-10-07 DIAGNOSIS — N390 Urinary tract infection, site not specified: Secondary | ICD-10-CM | POA: Diagnosis not present

## 2017-10-07 DIAGNOSIS — R6889 Other general symptoms and signs: Secondary | ICD-10-CM

## 2017-10-07 DIAGNOSIS — Z09 Encounter for follow-up examination after completed treatment for conditions other than malignant neoplasm: Secondary | ICD-10-CM | POA: Diagnosis not present

## 2017-10-07 DIAGNOSIS — N183 Chronic kidney disease, stage 3 unspecified: Secondary | ICD-10-CM

## 2017-10-07 DIAGNOSIS — I482 Chronic atrial fibrillation, unspecified: Secondary | ICD-10-CM

## 2017-10-07 DIAGNOSIS — J9610 Chronic respiratory failure, unspecified whether with hypoxia or hypercapnia: Secondary | ICD-10-CM

## 2017-10-07 DIAGNOSIS — I5030 Unspecified diastolic (congestive) heart failure: Secondary | ICD-10-CM | POA: Diagnosis not present

## 2017-10-07 DIAGNOSIS — E1122 Type 2 diabetes mellitus with diabetic chronic kidney disease: Secondary | ICD-10-CM

## 2017-10-07 DIAGNOSIS — N179 Acute kidney failure, unspecified: Secondary | ICD-10-CM

## 2017-10-07 DIAGNOSIS — I509 Heart failure, unspecified: Secondary | ICD-10-CM | POA: Diagnosis not present

## 2017-10-07 DIAGNOSIS — R609 Edema, unspecified: Secondary | ICD-10-CM | POA: Diagnosis not present

## 2017-10-07 DIAGNOSIS — Z794 Long term (current) use of insulin: Secondary | ICD-10-CM

## 2017-10-07 DIAGNOSIS — I4891 Unspecified atrial fibrillation: Secondary | ICD-10-CM | POA: Diagnosis not present

## 2017-10-07 DIAGNOSIS — Z7901 Long term (current) use of anticoagulants: Secondary | ICD-10-CM | POA: Diagnosis not present

## 2017-10-07 LAB — POCT INR: INR: 1.4

## 2017-10-07 MED ORDER — FERROUS FUMARATE 324 (106 FE) MG PO TABS: 1.0000 | ORAL_TABLET | Freq: Two times a day (BID) | ORAL | 1 refills | Status: DC

## 2017-10-07 MED ORDER — ROSUVASTATIN CALCIUM 10 MG PO TABS: 10.0000 mg | ORAL_TABLET | Freq: Every day | ORAL | 0 refills | Status: AC

## 2017-10-07 MED ORDER — FUROSEMIDE 40 MG PO TABS: 40.0000 mg | ORAL_TABLET | Freq: Every day | ORAL | 3 refills | Status: DC

## 2017-10-07 MED ORDER — CHOLECALCIFEROL 25 MCG (1000 UT) PO TABS: 1000.0000 [IU] | ORAL_TABLET | Freq: Every day | ORAL | 1 refills | Status: DC

## 2017-10-07 MED ORDER — CVS MELATONIN 5 MG PO TABS: 1.0000 | ORAL_TABLET | Freq: Every evening | ORAL | 1 refills | Status: DC | PRN

## 2017-10-07 MED ORDER — INSULIN GLARGINE 100 UNIT/ML ~~LOC~~ SOLN: 12.0000 [IU] | Freq: Every day | SUBCUTANEOUS | 11 refills | Status: DC

## 2017-10-07 MED ORDER — METOPROLOL SUCCINATE ER 100 MG PO TB24: 100.0000 mg | ORAL_TABLET | Freq: Every day | ORAL | 3 refills | Status: DC

## 2017-10-07 MED ORDER — VITAMIN C 500 MG PO TABS: 1000.0000 mg | ORAL_TABLET | Freq: Every day | ORAL | 1 refills | Status: DC

## 2017-10-07 MED ORDER — GERHARDT'S BUTT CREAM: 1.0000 "application " | TOPICAL_CREAM | Freq: Two times a day (BID) | CUTANEOUS | 0 refills | Status: DC

## 2017-10-07 MED ORDER — COLLAGENASE 250 UNIT/GM EX OINT: TOPICAL_OINTMENT | Freq: Every day | CUTANEOUS | 0 refills | Status: DC

## 2017-10-07 MED ORDER — ZINC SULFATE 220 (50 ZN) MG PO CAPS: 220.0000 mg | ORAL_CAPSULE | Freq: Every day | ORAL | 3 refills | Status: DC

## 2017-10-07 MED ORDER — WARFARIN SODIUM 2.5 MG PO TABS: 2.5000 mg | ORAL_TABLET | Freq: Every day | ORAL | 11 refills | Status: DC

## 2017-10-07 MED ORDER — NITROGLYCERIN 0.4 MG SL SUBL: SUBLINGUAL_TABLET | SUBLINGUAL | 1 refills | Status: AC

## 2017-10-07 MED ORDER — ADULT MULTIVITAMIN W/MINERALS CH: 1.0000 | ORAL_TABLET | Freq: Every day | ORAL | 3 refills | Status: AC

## 2017-10-07 MED ORDER — ADULT MULTIVITAMIN W/MINERALS CH: 1.0000 | ORAL_TABLET | Freq: Every day | ORAL | 3 refills | Status: DC

## 2017-10-07 MED ORDER — LEVOTHYROXINE SODIUM 137 MCG PO TABS: 137.0000 ug | ORAL_TABLET | Freq: Every day | ORAL | 0 refills | Status: DC

## 2017-10-07 NOTE — Assessment & Plan Note (Addendum)
On 2 L O2 nasal cannula chronically. Unclear why she has chronic respiratory failure, no COPD but does have OSA and hx of tobacco abuse. No signs of hypoxia today. -Continue 2 L nasal cannula day and night for now -Has sleep study scheduled at the end of November for new CPAP settings

## 2017-10-07 NOTE — Patient Instructions (Addendum)
Thank you for coming in today, it was so nice to see you! Today we talked about:    Hospital follow up: You are doing much better since being in the hospital.   Continue to elevate your legs  I have placed a referral for a heart specialist, someone will call you to schedule this  We are checking your blood today   Please go to the ER if you are having fever, worsening shortness of breath, or chest pain  Please follow up in 2 weeks for diabetes and A1C check with Dr. Sydnee Cabal who is your PCP. You can schedule this appointment at the front desk before you leave or call the clinic.  Bring in all your medications or supplements to each appointment for review.   If we ordered any tests today, you will be notified via telephone of any abnormalities. If everything is normal you will get a letter in the mail.   If you have any questions or concerns, please do not hesitate to call the office at [phone]. You can also message me directly via MyChart.   Sincerely,  Anders Simmonds, MD

## 2017-10-07 NOTE — Assessment & Plan Note (Addendum)
Uncontrolled based off of last Hemoglobin A1c of 9.7 07/25/2017. Fasting glucose is acceptable. Glucose in PM uncontrolled and will likely need further titration of sliding scale in the future. She is doing an excellent job at keeping track of her glucose values.  -Continue 12 units of Lantus daily at bedtime -Continue sliding scale insulin coverage -Follow-up in 2-3 weeks with PCP Dr. Sydnee Cabal for diabetes management and A1c

## 2017-10-07 NOTE — Assessment & Plan Note (Signed)
Resolved.  Completed PO Keflex course. No symptoms of UTI today

## 2017-10-09 DIAGNOSIS — R609 Edema, unspecified: Secondary | ICD-10-CM | POA: Insufficient documentation

## 2017-10-09 DIAGNOSIS — N183 Chronic kidney disease, stage 3 unspecified: Secondary | ICD-10-CM | POA: Insufficient documentation

## 2017-10-09 LAB — CBC WITH DIFFERENTIAL/PLATELET
BASOS: 1 %
Basophils Absolute: 0.1 10*3/uL (ref 0.0–0.2)
EOS (ABSOLUTE): 0.3 10*3/uL (ref 0.0–0.4)
EOS: 3 %
Hematocrit: 31 % — ABNORMAL LOW (ref 34.0–46.6)
Hemoglobin: 9.4 g/dL — ABNORMAL LOW (ref 11.1–15.9)
IMMATURE GRANS (ABS): 0 10*3/uL (ref 0.0–0.1)
IMMATURE GRANULOCYTES: 0 %
LYMPHS: 10 %
Lymphocytes Absolute: 1 10*3/uL (ref 0.7–3.1)
MCH: 29.8 pg (ref 26.6–33.0)
MCHC: 30.3 g/dL — ABNORMAL LOW (ref 31.5–35.7)
MCV: 98 fL — ABNORMAL HIGH (ref 79–97)
MONOS ABS: 0.9 10*3/uL (ref 0.1–0.9)
Monocytes: 9 %
NEUTROS PCT: 77 %
Neutrophils Absolute: 7.8 10*3/uL — ABNORMAL HIGH (ref 1.4–7.0)
PLATELETS: 261 10*3/uL (ref 150–379)
RBC: 3.15 x10E6/uL — AB (ref 3.77–5.28)
RDW: 15.5 % — AB (ref 12.3–15.4)
WBC: 10.1 10*3/uL (ref 3.4–10.8)

## 2017-10-09 LAB — COMPREHENSIVE METABOLIC PANEL
ALT: 14 IU/L (ref 0–32)
AST: 14 IU/L (ref 0–40)
Albumin/Globulin Ratio: 0.8 — ABNORMAL LOW (ref 1.2–2.2)
Albumin: 3.2 g/dL — ABNORMAL LOW (ref 3.6–4.8)
Alkaline Phosphatase: 160 IU/L — ABNORMAL HIGH (ref 39–117)
BUN/Creatinine Ratio: 25 (ref 12–28)
BUN: 40 mg/dL — AB (ref 8–27)
Bilirubin Total: 0.4 mg/dL (ref 0.0–1.2)
CALCIUM: 9.6 mg/dL (ref 8.7–10.3)
CO2: 32 mmol/L — AB (ref 20–29)
CREATININE: 1.61 mg/dL — AB (ref 0.57–1.00)
Chloride: 99 mmol/L (ref 96–106)
GFR, EST AFRICAN AMERICAN: 38 mL/min/{1.73_m2} — AB (ref >59)
GFR, EST NON AFRICAN AMERICAN: 33 mL/min/{1.73_m2} — AB (ref >59)
Globulin, Total: 3.8 g/dL (ref 1.5–4.5)
Glucose: 166 mg/dL — ABNORMAL HIGH (ref 65–99)
POTASSIUM: 4.8 mmol/L (ref 3.5–5.2)
Sodium: 146 mmol/L — ABNORMAL HIGH (ref 134–144)
TOTAL PROTEIN: 7 g/dL (ref 6.0–8.5)

## 2017-10-09 NOTE — Assessment & Plan Note (Signed)
Had AKI while in the hospital. Apparently baseline Cr is 1.7. Today it is 1.6.  - Continue routine BMPs - Consider referral to nephrology is she isn't already seeing a nephrologist

## 2017-10-09 NOTE — Assessment & Plan Note (Signed)
Does have 1+ pitting edema in bilateral extremities and signs of vascular insufficiency but otherwise no signs of volume overload/CHF exacerbation.  - Continue Lasix 40 mg daily - Continue Metoprolol Succinate 100 mg daily

## 2017-10-09 NOTE — Assessment & Plan Note (Signed)
Edematous legs with chronic vascular changes. Reassuringly patient has no signs of CHF exacerbation with clear lungs and normal work of breathing. Legs from knees down are erythematous and weeping clear fluid. Admits she has not been elevating her legs.  - Continue current dose of Lasix - Encouraged leg elevation and compression stockings - Return precautions discussed

## 2017-10-09 NOTE — Assessment & Plan Note (Addendum)
Had abnormal ABI while in the hospital. 09/01/17: ABIs with likely PAD. ABI- R: Non compressible;L: 0.85.  - Can consider referral to vascular surgery if patient would like to pursue that, but did not discuss this at visit today due to several other items of importance that needed to be discussed

## 2017-10-09 NOTE — Assessment & Plan Note (Signed)
Irregular rhythm today but rate controlled. Anticoagulation with coumadin secondary to worsening renal function while hospitalized (apprently used to be on Eliquis) Subtherapeutic INR of 1.4 today (Goal INR 2-3).  - Increase Coumadin from 2.5 mg daily to 5 mg daily.  - Follow up in 2 days for repeat INR check - Patient does not have a cardiologist in [place], referral placed

## 2017-10-10 ENCOUNTER — Other Ambulatory Visit: Payer: Self-pay | Admitting: *Deleted

## 2017-10-10 LAB — POCT INR: INR: 2.3

## 2017-10-11 ENCOUNTER — Encounter (INDEPENDENT_AMBULATORY_CARE_PROVIDER_SITE_OTHER): Payer: Self-pay

## 2017-10-11 ENCOUNTER — Encounter: Payer: Self-pay | Admitting: Physician Assistant

## 2017-10-11 ENCOUNTER — Ambulatory Visit (INDEPENDENT_AMBULATORY_CARE_PROVIDER_SITE_OTHER): Payer: Medicare Other | Admitting: Physician Assistant

## 2017-10-11 ENCOUNTER — Encounter (HOSPITAL_BASED_OUTPATIENT_CLINIC_OR_DEPARTMENT_OTHER): Payer: Medicare Other

## 2017-10-11 VITALS — BP 110/70 | HR 96 | Ht 65.0 in | Wt 273.8 lb

## 2017-10-11 DIAGNOSIS — I5042 Chronic combined systolic (congestive) and diastolic (congestive) heart failure: Secondary | ICD-10-CM

## 2017-10-11 DIAGNOSIS — I4819 Other persistent atrial fibrillation: Secondary | ICD-10-CM

## 2017-10-11 DIAGNOSIS — R6889 Other general symptoms and signs: Secondary | ICD-10-CM

## 2017-10-11 DIAGNOSIS — I25110 Atherosclerotic heart disease of native coronary artery with unstable angina pectoris: Secondary | ICD-10-CM

## 2017-10-11 DIAGNOSIS — E785 Hyperlipidemia, unspecified: Secondary | ICD-10-CM

## 2017-10-11 DIAGNOSIS — I481 Persistent atrial fibrillation: Secondary | ICD-10-CM | POA: Diagnosis not present

## 2017-10-11 DIAGNOSIS — I10 Essential (primary) hypertension: Secondary | ICD-10-CM

## 2017-10-11 DIAGNOSIS — G4733 Obstructive sleep apnea (adult) (pediatric): Secondary | ICD-10-CM | POA: Diagnosis not present

## 2017-10-11 DIAGNOSIS — N183 Chronic kidney disease, stage 3 unspecified: Secondary | ICD-10-CM

## 2017-10-11 MED ORDER — FUROSEMIDE 40 MG PO TABS: 40.0000 mg | ORAL_TABLET | Freq: Every day | ORAL | 3 refills | Status: DC

## 2017-10-11 NOTE — Progress Notes (Signed)
Cardiology Office Note    Date:  10/11/2017   ID:  [name], DOB June 08, 1951, MRN [id number]  PCP:  [name], MD  Cardiologist: Dr. Katrinka Blazing (previously followed by Dr. Tami Ribas @ [facility])  Chief Complaint: Atrial fibrillation   History of Present Illness:   Sylvia Craig is a 66 y.o. 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   She underwent a cardiac cath 09/2014 @ [facility] which demonstrated three vessel disease. RCA and LCx occlusions with decent collaterals, D1 severe disease, LAD moderate diffuse disease not suitable for PCI. Managed medically.   Patient was recently admitted 08/2017 for sepsis secondary to UTI, hypercapnea, acute on chronic renal failure and chronic diastolic heart failure. Echocardiogram showed LV function of 45-50% (stable from prior echo), no wall motion abnormality, PA peak pressure of 62 mmHg. Elevated troponin felt demand. Eliquis changed to coumadin due to decline in renal function. Baseline Scr 1.3-1.5 Patient has abnormal ABI.   Here today for follow up.  Patient has gained 5lb in past one week. Intermittently dyspnea with lower extremity edema. Oozing fluids. She is on 2 L of oxygen 24/7. She is improving slowly from admission. She has shortness of breath during OT. No chest pain. Sleeps chronically on multiple pillows. No blood in her stool or urine. Compliant low sodium diet.  Past Medical History:  Diagnosis Date  . [name] (coronary artery disease), native coronary artery   . CHF (congestive heart failure) (HCC)   . CKD (chronic kidney disease), stage III (HCC)   . Diabetes mellitus without complication (HCC)   . Hyperlipidemia LDL goal <70   . Hypertension   . Iron (Fe) deficiency anemia   . Thyroid disease     Past Surgical History:  Procedure  Laterality Date  . APPLICATION OF A-CELL OF EXTREMITY Left 07/27/2017   Procedure: APPLICATION OF A-CELL;  Surgeon: Peggye Form, DO;  Location: [facility] OR;  Service: Plastics;  Laterality: Left;  . I&D EXTREMITY Left 07/24/2017   Procedure: IRRIGATION AND DEBRIDEMENT EXTREMITY;  Surgeon: Sheral Apley, MD;  Location: [facility] OR;  Service: Orthopedics;  Laterality: Left;  . I&D EXTREMITY Left 07/27/2017   Procedure: IRRIGATION AND DEBRIDEMENT OF LEFT HAND;  Surgeon: Peggye Form, DO;  Location: [facility] OR;  Service: Plastics;  Laterality: Left;    Current Medications: Prior to Admission medications   Medication Sig Start Date End Date Taking? Authorizing Provider  acetaminophen (TYLENOL) 500 MG tablet Take 1,000 mg by mouth every 6 (six) hours as needed for moderate pain or headache.     [provider]  Cholecalciferol (VITAMIN D-1000 MAX ST) 1000 units tablet Take 1 tablet (1,000 Units total) by mouth at bedtime. 10/07/17   Beaulah Dinning, MD  collagenase (SANTYL) ointment Apply topically daily. 10/07/17   Beaulah Dinning, MD  [employer] MELATONIN 5 MG TABS Take 1 tablet (5 mg total) by mouth at bedtime as needed. for sleep 10/07/17   Beaulah Dinning, MD  Ferrous Fumarate (HEMOCYTE - 106 MG FE) 324 (106 Fe) MG TABS tablet Take 1 tablet (106 mg of iron total) by mouth 2 (two) times daily. 10/07/17   Beaulah Dinning, MD  furosemide (LASIX) 40 MG tablet Take 1 tablet (40 mg total) by mouth daily. 10/07/17   Beaulah Dinning, MD  Hydrocortisone (GERHARDT'S BUTT CREAM) CREA Apply 1 application topically 2 (two) times daily. 10/07/17   Beaulah Dinning, MD  insulin aspart (NOVOLOG) 100 UNIT/ML injection Inject 0-9 Units into the skin 3 (three) times daily with meals. 09/05/17   [name], [place], DO  insulin glargine (LANTUS) 100 UNIT/ML injection Inject 0.12 mLs (12 Units total) into the skin at bedtime. 10/07/17   Beaulah Dinning, MD  levothyroxine (SYNTHROID,  LEVOTHROID) 137 MCG tablet Take 1 tablet (137 mcg total) by mouth daily before breakfast. 10/07/17   Beaulah Dinning, MD  metoprolol succinate (TOPROL-XL) 100 MG 24 hr tablet Take 1 tablet (100 mg total) by mouth daily. 10/07/17   Beaulah Dinning, MD  Multiple Vitamin (MULTIVITAMIN WITH MINERALS) TABS tablet Take 1 tablet by mouth daily. 10/07/17   Beaulah Dinning, MD  nitroGLYCERIN (NITROSTAT) 0.4 MG SL tablet Take 0.4 mg under the toungue as needed for chest pain 10/07/17   Beaulah Dinning, MD  rosuvastatin (CRESTOR) 10 MG tablet Take 1 tablet (10 mg total) by mouth at bedtime. 10/07/17   Beaulah Dinning, MD  senna-docusate (SENOKOT-S) 8.6-50 MG tablet Take 1 tablet by mouth at bedtime as needed for mild constipation. 09/05/17   [name], [place], DO  vitamin C (ASCORBIC ACID) 500 MG tablet Take 2 tablets (1,000 mg total) by mouth at bedtime. 10/07/17   Beaulah Dinning, MD  warfarin (COUMADIN) 2.5 MG tablet Take 1 tablet (2.5 mg total) by mouth daily at 6 PM. 10/07/17 10/07/18  Beaulah Dinning, MD  zinc sulfate 220 (50 Zn) MG capsule Take 1 capsule (220 mg total) by mouth daily. 10/07/17   Beaulah Dinning, MD    Allergies:   Codeine; Glucosamine; Oxycodone; Shellfish allergy; Shellfish-derived products; Atorvastatin; Lisinopril; Other; and Oxycodone-aspirin   Social History   Social History  . Marital status: Legally Separated    Spouse name: N/A  . Number of children: N/A  . Years of education: N/A   Social History Main Topics  . Smoking status: Former Smoker    Packs/day: 1.00    Types: Cigarettes    Quit date: 02/24/1988  . Smokeless tobacco: Never Used  . Alcohol use No  . Drug use: No  . Sexual activity: Not Asked   Other Topics Concern  . None   Social History Narrative  . None     Family History:  The patient's family history includes [name] in her mother; Cancer in her father; Dementia in her mother.   ROS:   Please see the history  of present illness.    ROS All other systems reviewed and are negative.   PHYSICAL EXAM:   VS:  BP 110/70   Pulse 96   Ht  (1.651 m)   Wt 273 lb 12.8 oz (124.2 kg)   SpO2 98%   BMI 45.56 kg/m    GEN: Obese female in no acute distress on 2L oxygen  HEENT: normal  Neck: no JVD, carotid bruits, or masses Cardiac: Irregular; no murmurs, rubs, or gallops, Respiratory:bibasilar rales GI: soft, nontender, nondistended, + BS MS: no deformity or atrophy  Skin/Extremity: Bilateral lower extremity edema 1+  with draining wound and erythema Neuro:  Alert and Oriented x 3, Strength and sensation are intact Psych: euthymic mood, full affect  Wt Readings from Last 3 Encounters:  10/11/17 273 lb 12.8 oz (124.2 kg)  10/07/17 264 lb 12.8 oz (120.1 kg)  09/05/17 262 lb 4.8 oz (119 kg)  Studies/Labs Reviewed:   EKG:  EKG is not ordered today.    Recent Labs: 08/25/2017: B Natriuretic Peptide 396.5 10/07/2017: ALT 14; BUN 40; Creatinine, Ser 1.61; Hemoglobin 9.4; Platelets 261; Potassium 4.8; Sodium 146   Lipid Panel    Component Value Date/Time   CHOL 66 08/27/2017 0303   TRIG 91 08/27/2017 0303   HDL <10 (L) 08/27/2017 0303   CHOLHDL NOT CALCULATED 08/27/2017 0303   VLDL 18 08/27/2017 0303   LDLCALC NOT CALCULATED 08/27/2017 0303    Additional studies/ records that were reviewed today include:   Echocardiogram: 08/30/17 Study Conclusions  - Left ventricle: The cavity size was moderately dilated. There was   mild concentric hypertrophy. Systolic function was mildly   reduced. The estimated ejection fraction was in the range of 45%   to 50%. Wall motion was normal; there were no regional wall   motion abnormalities. - Aortic valve: Transvalvular velocity was within the normal range.   There was no stenosis. There was no regurgitation. - Mitral valve: Severely calcified annulus. Thickening and   calcification of the mitral valve leaflets. Mobility was   restricted.  Transvalvular velocity was within the normal range.   There was no evidence for stenosis. There was trivial   regurgitation. - Right ventricle: The cavity size was normal. Wall thickness was   normal. Systolic function was normal. - Tricuspid valve: There was mild regurgitation. - Pulmonary arteries: Systolic pressure was severely increased. PA   peak pressure: 62 mm Hg (S). - Pericardium, extracardiac: There was evidence for increased RV-LV   interaction demonstrated by respirophasic changes in tricuspid   velocities.  ABI 09/01/17 Brachial pressures:  +--------+-----+----+---+ !    !Right!Left!Max! +--------+-----+----+---+ !Systolic!136 !122 !136! +--------+-----+----+---+  Arterial pressure indices:  +-----------------+---------+--------------+-------------------+ !Location     !Pressure !Brachial index!Waveform      ! +-----------------+---------+--------------+-------------------+ !Right ant tibial !255 mm Hg!1.88     !Dampened monophasic! +-----------------+---------+--------------+-------------------+ !Right post tibial!---------!--------------!not audible    ! +-----------------+---------+--------------+-------------------+ !Right 1st toe  !79 mm Hg !0.58     !abnormal      ! +-----------------+---------+--------------+-------------------+ !Left ant tibial !91 mm Hg !0.67     !Dampened monophasic! +-----------------+---------+--------------+-------------------+ !Left post tibial !116 mm Hg!0.85     !Dampened monophasic! +-----------------+---------+--------------+-------------------+ !Left 1st toe   !34 mm Hg !0.25     !abnormal      ! +-----------------+---------+--------------+-------------------+  ------------------------------------------------------------------- Summary: ABI falsely elevated. Waveforms consistent with moderate to severe disease in both legs.  Cardiac Cath:  10/09/14 Impressions: Triple-vessel [name]. RCA and circumflex occlusions appear chronic with decent collaterals. The 1 is a moderate size branch with severe disease, the LAD proper has nonobstructive disease. Anatomy poorly suited for PCI. 2. Elevated LVEDP of 24 mm of mercury 3. Successful closure of our as a arteriotomy with Exoseal device. Absent aortic stenosis.  Recommendations  1. Continue medical therapy 2. Follow-up with Dr. Tami Ribas to discuss further management (medical treatment versus CABG). Lack of significant LAD proper disease likely favors initial medical therapy.  Nuclear Stress 09/25/14 IMPRESSION:  Myocardial perfusion imaging is Abnormal- Large infarct inferior and inferolateral, mild peri-infarct ischemia. Mild reversible perfusion defect anterolateral. Overall ischemic burden to myocardium 10%.  Summed severity score is abnormal 28.  Artifacts noted: breast and diaphragm.  Overall left ventricular systolic function was Abnormal- EF 35% with regional wall motion abnormalities (see above).  Compared to the prior study from no prior.   ASSESSMENT & PLAN:    1. [name] - Known occluded Lcx and RCA  with patent LAD by cath 09/2014. No recurrent symptoms. Continue medical therpay.   2. Persistent atrial fibrillation - Rate controlled. Eliquis changed  to Coumadin during admission due to worsening renal failure. Coumadin managed by PCP.  3. OSA - Pending sleep study next month  4. Abnormal ABI with bilateral wound - ABI- R: Non compressible;L: 0.85 on 09/01/17. Likely PAD. Will refer to to Dr. Allyson Sabal for [facility] evaluation. She has oozing wound. Refer to wound clinic.   5. Acute on Chronic combined CHF - Stable LVEF of 45-50% on recent echo. She has gained 5 lb in past one week. Increase lasix as below.   6. CKD stage III - Baseline Scr of 1.3-1.5. Scr was 1.6 on 10/07/17. She will discussed with PCP regarding nephrology follow up.   7. HTN - Stable on current medication   8.  HLD - 08/27/2017: Cholesterol 66; HDL <10; LDL Cholesterol NOT CALCULATED; Triglycerides 91; VLDL 18  - continue statin   Medication Adjustments/Labs and Tests Ordered: Current medicines are reviewed at length with the patient today.  Concerns regarding medicines are outlined above.  Medication changes, Labs and Tests ordered today are listed in the Patient Instructions below. Patient Instructions  Medication Instructions:  Your physician has recommended you make the following change in your medication:   1.) Increase Lasix (Furosemide) to  twice daily for 3 days (Wednesday, Thursday, Friday) then go back to  daily   May take extra  Furosemide daily for shortness of breath or leg swelling.   -- If you need a refill on your cardiac medications before your next appointment, please call your pharmacy. --  Labwork: Your physician recommends that you return for lab work in: 1 week (BMET)   Testing/Procedures: None ordered  Follow-Up:  Your physician wants you to follow-up in: 6 weeks with Dr. Katrinka Blazing.    You have been referred to Dr. Allyson Sabal for your peripheral vascular disease.  You have been referred to wound clinic for management of leg wound.   Thank you for choosing [facility]!!   Sigurd Sos, RN [phone]  Any Other Special Instructions Will Be Listed Below (If Applicable).            Lorelei Pont, PA  10/11/2017 11:41 AM    [facility] [address] [place], [place], [place]  [address] Phone: [phone]; Fax: [phone]

## 2017-10-11 NOTE — Patient Instructions (Addendum)
Medication Instructions:  Your physician has recommended you make the following change in your medication:   1.) Increase Lasix (Furosemide) to  twice daily for 3 days (Wednesday, Thursday, Friday) then go back to  daily   May take extra  Furosemide daily for shortness of breath or leg swelling.   -- If you need a refill on your cardiac medications before your next appointment, please call your pharmacy. --  Labwork: Your physician recommends that you return for lab work in: 1 week (BMET)   Testing/Procedures: None ordered  Follow-Up:  Your physician wants you to follow-up in: 6 weeks with Dr. Katrinka Blazing.    You have been referred to Dr. Allyson Sabal for your peripheral vascular disease.  You have been referred to wound clinic for management of leg wound.   Thank you for choosing [facility]!!   Sigurd Sos, RN [phone]  Any Other Special Instructions Will Be Listed Below (If Applicable).

## 2017-10-13 ENCOUNTER — Other Ambulatory Visit: Payer: Self-pay | Admitting: *Deleted

## 2017-10-13 LAB — POCT INR: INR: 4.7

## 2017-10-17 ENCOUNTER — Telehealth: Payer: Self-pay | Admitting: *Deleted

## 2017-10-17 LAB — POCT INR: INR: 3.3

## 2017-10-17 NOTE — Telephone Encounter (Signed)
Tiffany from [facility] called back to get dosage for pt. Pt INR was 3.3. Per Molly Madurorobert she is to take 2.5 mg Monday, Tuesday, Thursday, Friday, and Sunday; 5 mg Wednesday and Saturday. Her INR is to be rechecked on Monday 10/24/17. Deseree Bruna PotterBlount, CMA

## 2017-10-18 ENCOUNTER — Other Ambulatory Visit: Payer: Medicare Other

## 2017-10-18 ENCOUNTER — Other Ambulatory Visit: Payer: Self-pay | Admitting: *Deleted

## 2017-10-18 ENCOUNTER — Telehealth: Payer: Self-pay | Admitting: *Deleted

## 2017-10-18 NOTE — Telephone Encounter (Signed)
Mark, [facility] verbal orders for extension of PT for patient 2 times for 4 weeks then 1 time a week for 1 week starting October 28.

## 2017-10-18 NOTE — Telephone Encounter (Signed)
Will forward to MD to give verbal ok and we will call the home health agency to inform them. Jazmin Hartsell,CMA

## 2017-10-18 NOTE — Telephone Encounter (Signed)
Verbal order given for extension of her PT.  Thanks  Lovena NeighboursAbdoulaye Lamar Naef, MD [facility], PGY-2

## 2017-10-21 ENCOUNTER — Ambulatory Visit: Payer: Medicare Other | Admitting: Cardiovascular Disease

## 2017-10-24 ENCOUNTER — Other Ambulatory Visit (INDEPENDENT_AMBULATORY_CARE_PROVIDER_SITE_OTHER): Payer: Medicare Other | Admitting: *Deleted

## 2017-10-24 DIAGNOSIS — Z7901 Long term (current) use of anticoagulants: Secondary | ICD-10-CM

## 2017-10-24 LAB — POCT INR

## 2017-10-25 ENCOUNTER — Other Ambulatory Visit: Payer: Medicare Other

## 2017-10-25 ENCOUNTER — Other Ambulatory Visit: Payer: Self-pay | Admitting: Family Medicine

## 2017-10-25 ENCOUNTER — Telehealth: Payer: Self-pay

## 2017-10-25 MED ORDER — ZINC SULFATE 220 (50 ZN) MG PO CAPS: 220.0000 mg | ORAL_CAPSULE | Freq: Every day | ORAL | 0 refills | Status: AC

## 2017-10-25 NOTE — Telephone Encounter (Signed)
Zinc prescription was send to her pharmacy.  Thank you  Lovena NeighboursAbdoulaye Kian Gamarra, MD [facility], PGY-2

## 2017-10-25 NOTE — Telephone Encounter (Signed)
Patient called and said that all of her meds were approved but the Zinc and that she needs a prescription called into the pharmacy. It was prescribed by Gambino.Glennie HawkSimpson, Adeana Grilliot R

## 2017-10-27 ENCOUNTER — Other Ambulatory Visit: Payer: Self-pay | Admitting: Family Medicine

## 2017-10-27 ENCOUNTER — Other Ambulatory Visit: Payer: Self-pay | Admitting: *Deleted

## 2017-10-27 ENCOUNTER — Telehealth: Payer: Self-pay | Admitting: Family Medicine

## 2017-10-27 DIAGNOSIS — Z09 Encounter for follow-up examination after completed treatment for conditions other than malignant neoplasm: Secondary | ICD-10-CM

## 2017-10-27 DIAGNOSIS — E1122 Type 2 diabetes mellitus with diabetic chronic kidney disease: Secondary | ICD-10-CM

## 2017-10-27 DIAGNOSIS — Z794 Long term (current) use of insulin: Principal | ICD-10-CM

## 2017-10-27 LAB — POCT INR: INR: 3.3

## 2017-10-27 MED ORDER — CVS MELATONIN 5 MG PO TABS: 1.0000 | ORAL_TABLET | Freq: Every evening | ORAL | 1 refills | Status: DC | PRN

## 2017-10-27 MED ORDER — CHOLECALCIFEROL 25 MCG (1000 UT) PO TABS: 1000.0000 [IU] | ORAL_TABLET | Freq: Every day | ORAL | 1 refills | Status: DC

## 2017-10-27 MED ORDER — VITAMIN C 500 MG PO TABS: 1000.0000 mg | ORAL_TABLET | Freq: Every day | ORAL | 1 refills | Status: AC

## 2017-10-27 NOTE — Telephone Encounter (Signed)
Pt pharmacy did not receive fax with Rx refills on 10/12 due to the machine being down. Needs Rx to be resent for the medications: vitamin D, vitamin C, multivitamin and melatonin.

## 2017-10-31 ENCOUNTER — Telehealth: Payer: Self-pay | Admitting: Family Medicine

## 2017-10-31 NOTE — Telephone Encounter (Signed)
GrenadaBrittany from [facility] called on behalf of pt requesting that the pt get an extension on [place] occupational therapy. The request is for 2 times a week for 3 more weeks. If any questions called GrenadaBrittany at [phone].

## 2017-10-31 NOTE — Telephone Encounter (Signed)
Verbal orders given per MD.

## 2017-10-31 NOTE — Telephone Encounter (Signed)
Verbal order give to Sylvia Craig for extension of Mr. Sylvia Craig OT.  Thanks  Lovena NeighboursAbdoulaye Tamara Kenyon, MD [facility], PGY-2

## 2017-11-01 ENCOUNTER — Other Ambulatory Visit: Payer: Self-pay | Admitting: *Deleted

## 2017-11-01 LAB — PROTIME-INR: INR: 2.1 — AB (ref 0.9–1.1)

## 2017-11-03 ENCOUNTER — Ambulatory Visit: Payer: Medicare Other | Admitting: Family Medicine

## 2017-11-03 ENCOUNTER — Other Ambulatory Visit: Payer: Self-pay

## 2017-11-03 ENCOUNTER — Encounter: Payer: Self-pay | Admitting: Family Medicine

## 2017-11-03 VITALS — BP 126/70 | HR 82 | Temp 97.6°F | Wt 260.0 lb

## 2017-11-03 DIAGNOSIS — Z794 Long term (current) use of insulin: Secondary | ICD-10-CM | POA: Diagnosis not present

## 2017-11-03 DIAGNOSIS — E1122 Type 2 diabetes mellitus with diabetic chronic kidney disease: Secondary | ICD-10-CM | POA: Diagnosis not present

## 2017-11-03 DIAGNOSIS — R2243 Localized swelling, mass and lump, lower limb, bilateral: Secondary | ICD-10-CM

## 2017-11-03 LAB — POCT GLYCOSYLATED HEMOGLOBIN (HGB A1C): HEMOGLOBIN A1C: 6.7

## 2017-11-03 NOTE — Progress Notes (Signed)
Subjective:    Patient ID: Sylvia Craig, female    DOB: 03/16/1951, 66 y.o.   MRN: [id number]   CC: Type 2 diabetes follow-up  HPI: Patient is a 66 year old female who presented today to follow-up type 2 diabetes management as well as lower extremity swelling.  Type 2 diabetes: Patient was recently seen in clinic on 10/12 when she started to have elevated p.m. blood glucose and adequate fasting blood glucose in the morning. As A1c was 9.7 on 07/25/2017. She is currently on Lantus as well as sliding scale. Reports some changes in dietary habits and adherence to current regimen. Patient brought in her glucometer which showed a 7 day average blood glucose of 181. Patient denies any polyuria, polydipsia, nausea, vomiting, diaphoresis and other signs of hypoglycemia.  Lower extremity swelling: Patient is a history of lower extremity swelling likely a combination of CHF and venous stasis. Patient is down surgeon pounds since last office visit. No increased work of breathing or shortness of breath concerning for CHF exacerbation. Patient reports increased weeping from her legs due to swelling. She has been cleaning her her legs with Dial soap. She denies using compression stocking, but has been elevating her legs. Patient has an appointment with wound care in December. Patient has been taking Lasix as prescribed. Denies any fevers, chills or other symptoms concerning for systemic infection.   Smoking status reviewed   ROS: all other systems were reviewed and are negative other than in the HPI   Past Medical History:  Diagnosis Date  . [name] (coronary artery disease), native coronary artery   . CHF (congestive heart failure) (HCC)   . CKD (chronic kidney disease), stage III (HCC)   . Diabetes mellitus without complication (HCC)   . Hyperlipidemia LDL goal <70   . Hypertension   . Iron (Fe) deficiency anemia   . Thyroid disease     No past surgical history on file.  Past medical history,  surgical, family, and social history reviewed and updated in the EMR as appropriate.  Objective:  BP 126/70   Pulse 82   Temp 97.6 F (36.4 C) (Oral)   Wt 260 lb (117.9 kg)   SpO2 99%   BMI 43.27 kg/m   Vitals and nursing note reviewed  General: NAD, pleasant, able to participate in exam Cardiac: RRR, normal heart sounds, no murmurs. 2+ radial and PT pulses bilaterally Respiratory: Mild crackles bibasilar, normal effort, No wheezes, rales or rhonchi Abdomen: soft, nontender, nondistended, no hepatic or splenomegaly, +BS Extremities: Bilateral lower extremity swelling, erythematous, skin is not warm to the touch. Weeping noted with white mucus-like fluid bilaterally. Minimal pain with palpation. No cord palpated. Neuro: alert and oriented x4, no focal deficits Psych: Normal affect and mood   Assessment & Plan:   #Type 2 diabetes A1c today is 6.7 improved from 9.7 back in July 2018. She is currently on Lantus 12 units at night as well as sliding scale. Patient reports elevated fasting blood glucose in the morning. Recommended slight increase in Lantus in the evening. Given age and multiple comorbidities, patient is at goal for glycemic control. Discussed definitive changes in order to sustain current A1c in addition to medication adherence. --Continue Lantus 14 units at night --Continue sliding scale insulin --Will continue to monitor  #Bilateral lower extremity swelling, worse, Patient presented with worsening bilateral lower extremity swelling concerning for cellulitis given erythema. However there is no signs of systemic infection such as fever or chills, and skin is not warm  to touch. Patient reports her erythema has decreased since last office visit and used to be higher up to her knee. Presentation is consistent with worsening venous stasis in the setting of 9 years to compression stocking. Patient has been elevating legs and taking Lasix as prescribed. No signs of CHF  exacerbation. Will recommend Ulna boots which will need to be change weekly. I will see patient in 2-3 weeks to reevaluate. I would not want to increase Lasix dosage given significant weight loss since last office visit and no shortness of breath concerning for CHF exacerbation. Patient has a nurse aid who will be able to help with dressing change. --Ulna boots placed in office --Will check BMP given worsening creatinine at last office visit and current Lasix regimen.  Patient has multiple medical problems will require several office visit in order to address all issues. Patient seems to be at baseline and appears to have sufficient help at home given limitations. See patient again in 2 weeks to address some of her medical issues. Patient is to for INR check tomorrow (11/10) which I'll clinic clinic has been monitoring closely.   Lovena NeighboursAbdoulaye Jermall Isaacson, MD [facility] PGY-2

## 2017-11-04 LAB — BASIC METABOLIC PANEL
BUN / CREAT RATIO: 18 (ref 12–28)
BUN: 30 mg/dL — ABNORMAL HIGH (ref 8–27)
CO2: 27 mmol/L (ref 20–29)
Calcium: 9.2 mg/dL (ref 8.7–10.3)
Chloride: 100 mmol/L (ref 96–106)
Creatinine, Ser: 1.71 mg/dL — ABNORMAL HIGH (ref 0.57–1.00)
GFR, EST AFRICAN AMERICAN: 35 mL/min/{1.73_m2} — AB (ref >59)
GFR, EST NON AFRICAN AMERICAN: 31 mL/min/{1.73_m2} — AB (ref >59)
Glucose: 142 mg/dL — ABNORMAL HIGH (ref 65–99)
POTASSIUM: 4.4 mmol/L (ref 3.5–5.2)
SODIUM: 141 mmol/L (ref 134–144)

## 2017-11-07 ENCOUNTER — Other Ambulatory Visit: Payer: Self-pay | Admitting: *Deleted

## 2017-11-07 LAB — POCT INR: INR: 2.35

## 2017-11-07 NOTE — Progress Notes (Signed)
Home health nurse given verbal orders to change bilateral una boots on patient on November 15 along with repeat INR check. Busick, Rodena Medinobert Lee

## 2017-11-10 ENCOUNTER — Telehealth: Payer: Self-pay | Admitting: Family Medicine

## 2017-11-10 ENCOUNTER — Other Ambulatory Visit: Payer: Self-pay | Admitting: *Deleted

## 2017-11-10 DIAGNOSIS — I4891 Unspecified atrial fibrillation: Secondary | ICD-10-CM

## 2017-11-10 LAB — POCT INR: INR: 3.7

## 2017-11-10 MED ORDER — WARFARIN SODIUM 3 MG PO TABS: ORAL_TABLET | ORAL | 2 refills | Status: DC

## 2017-11-10 NOTE — Telephone Encounter (Signed)
I am precepting today. Patient's HH nurse called Robert in the lab with patient's INR result, which was high at 3.7. Currently taking 2.5mg  daily for a total of 17.5 mg per week. Will change tablets to 3mg  tabs and have patient do 3mg  4 days per week, with 1.5 mg 3 days per week for a total of 16.5 mg per week, down 6% from current dose. Holding x1 day and then begin new dosing regimen. Recheck in 1 week.  I am sending in 3mg  tabs.  Latrelle DodrillBrittany J McIntyre, MD

## 2017-11-14 NOTE — Progress Notes (Signed)
When Tiffany, home health nurse called me on 11/10/17 to give INR results for that day she notified me at that time that the previous INR results were given incorrectly. She reported it as being 2.05 but informed me it should have been 2.35. These results would not have changed her coumadin dosing at that time but would have shown a steady increase in her numbers and should have been reported the moment it was discovered.Busick, Rodena Medinobert Lee

## 2017-11-15 ENCOUNTER — Other Ambulatory Visit: Payer: Self-pay | Admitting: *Deleted

## 2017-11-15 LAB — POCT INR: INR: 3.1

## 2017-11-22 ENCOUNTER — Telehealth: Payer: Self-pay | Admitting: Family Medicine

## 2017-11-22 ENCOUNTER — Encounter (HOSPITAL_BASED_OUTPATIENT_CLINIC_OR_DEPARTMENT_OTHER): Payer: Medicare Other | Attending: Surgery

## 2017-11-22 ENCOUNTER — Other Ambulatory Visit: Payer: Self-pay | Admitting: *Deleted

## 2017-11-22 ENCOUNTER — Telehealth: Payer: Self-pay | Admitting: *Deleted

## 2017-11-22 DIAGNOSIS — L97829 Non-pressure chronic ulcer of other part of left lower leg with unspecified severity: Secondary | ICD-10-CM | POA: Insufficient documentation

## 2017-11-22 DIAGNOSIS — E1122 Type 2 diabetes mellitus with diabetic chronic kidney disease: Secondary | ICD-10-CM | POA: Diagnosis not present

## 2017-11-22 DIAGNOSIS — E1151 Type 2 diabetes mellitus with diabetic peripheral angiopathy without gangrene: Secondary | ICD-10-CM | POA: Insufficient documentation

## 2017-11-22 DIAGNOSIS — I890 Lymphedema, not elsewhere classified: Secondary | ICD-10-CM | POA: Insufficient documentation

## 2017-11-22 DIAGNOSIS — L97222 Non-pressure chronic ulcer of left calf with fat layer exposed: Secondary | ICD-10-CM | POA: Insufficient documentation

## 2017-11-22 DIAGNOSIS — N186 End stage renal disease: Secondary | ICD-10-CM | POA: Insufficient documentation

## 2017-11-22 DIAGNOSIS — Z7901 Long term (current) use of anticoagulants: Secondary | ICD-10-CM | POA: Insufficient documentation

## 2017-11-22 DIAGNOSIS — I132 Hypertensive heart and chronic kidney disease with heart failure and with stage 5 chronic kidney disease, or end stage renal disease: Secondary | ICD-10-CM | POA: Insufficient documentation

## 2017-11-22 DIAGNOSIS — L97211 Non-pressure chronic ulcer of right calf limited to breakdown of skin: Secondary | ICD-10-CM | POA: Diagnosis not present

## 2017-11-22 DIAGNOSIS — L97819 Non-pressure chronic ulcer of other part of right lower leg with unspecified severity: Secondary | ICD-10-CM | POA: Insufficient documentation

## 2017-11-22 DIAGNOSIS — E11622 Type 2 diabetes mellitus with other skin ulcer: Secondary | ICD-10-CM | POA: Diagnosis present

## 2017-11-22 DIAGNOSIS — Z79899 Other long term (current) drug therapy: Secondary | ICD-10-CM | POA: Insufficient documentation

## 2017-11-22 DIAGNOSIS — I5020 Unspecified systolic (congestive) heart failure: Secondary | ICD-10-CM | POA: Insufficient documentation

## 2017-11-22 LAB — POCT INR: INR: 1.8

## 2017-11-22 NOTE — Telephone Encounter (Signed)
[facility] called requesting verbal orders from PCP for PT 1 time a week for 1 week, 2 times a week for 8 weeks, starting on 11/29. Also request verbal orders for a home health aid for bathing and personal care at the same frequency for the PT starting on 11/29 as well.  You can contact Lor[facility]Mark [facility], his phone number is [phone] and you can leave detailed VM if he doesn't answer. Please advise

## 2017-11-22 NOTE — Telephone Encounter (Signed)
Mark contacted and verbal orders given for pt. All was left on his VM as he did not answer.

## 2017-11-22 NOTE — Telephone Encounter (Signed)
Page, Could please could the verbal order in.   Thank you   Lovena NeighboursAbdoulaye Yuna Pizzolato, PGY-2

## 2017-11-22 NOTE — Telephone Encounter (Signed)
Received message on nurse line from [place] [facility] requesting [facility] for Skilled Nursing visits in home for Una wraps weekly as dir and coumadin draw prn for next 9 weeks. Please call Scott at [phone]. Kinnie FeilL. Lizania Bouchard, RN, BSN

## 2017-11-22 NOTE — Telephone Encounter (Signed)
Please advise, I can call in the verbal orders if ok.

## 2017-11-25 ENCOUNTER — Ambulatory Visit (HOSPITAL_BASED_OUTPATIENT_CLINIC_OR_DEPARTMENT_OTHER): Payer: Medicare Other | Attending: Family Medicine | Admitting: Internal Medicine

## 2017-11-25 VITALS — Ht 65.0 in | Wt 252.0 lb

## 2017-11-25 DIAGNOSIS — G4733 Obstructive sleep apnea (adult) (pediatric): Secondary | ICD-10-CM | POA: Diagnosis not present

## 2017-11-25 DIAGNOSIS — R0683 Snoring: Secondary | ICD-10-CM

## 2017-11-29 ENCOUNTER — Encounter (HOSPITAL_BASED_OUTPATIENT_CLINIC_OR_DEPARTMENT_OTHER): Payer: Medicare Other | Attending: Surgery

## 2017-11-29 DIAGNOSIS — I5022 Chronic systolic (congestive) heart failure: Secondary | ICD-10-CM | POA: Insufficient documentation

## 2017-11-29 DIAGNOSIS — I132 Hypertensive heart and chronic kidney disease with heart failure and with stage 5 chronic kidney disease, or end stage renal disease: Secondary | ICD-10-CM | POA: Insufficient documentation

## 2017-11-29 DIAGNOSIS — G4730 Sleep apnea, unspecified: Secondary | ICD-10-CM | POA: Diagnosis not present

## 2017-11-29 DIAGNOSIS — L97822 Non-pressure chronic ulcer of other part of left lower leg with fat layer exposed: Secondary | ICD-10-CM | POA: Insufficient documentation

## 2017-11-29 DIAGNOSIS — E1140 Type 2 diabetes mellitus with diabetic neuropathy, unspecified: Secondary | ICD-10-CM | POA: Insufficient documentation

## 2017-11-29 DIAGNOSIS — L97819 Non-pressure chronic ulcer of other part of right lower leg with unspecified severity: Secondary | ICD-10-CM | POA: Diagnosis not present

## 2017-11-29 DIAGNOSIS — E11622 Type 2 diabetes mellitus with other skin ulcer: Secondary | ICD-10-CM | POA: Insufficient documentation

## 2017-11-29 DIAGNOSIS — I2510 Atherosclerotic heart disease of native coronary artery without angina pectoris: Secondary | ICD-10-CM | POA: Insufficient documentation

## 2017-11-29 DIAGNOSIS — N186 End stage renal disease: Secondary | ICD-10-CM | POA: Insufficient documentation

## 2017-11-29 DIAGNOSIS — I890 Lymphedema, not elsewhere classified: Secondary | ICD-10-CM | POA: Insufficient documentation

## 2017-11-29 DIAGNOSIS — L97829 Non-pressure chronic ulcer of other part of left lower leg with unspecified severity: Secondary | ICD-10-CM | POA: Insufficient documentation

## 2017-11-29 DIAGNOSIS — E1151 Type 2 diabetes mellitus with diabetic peripheral angiopathy without gangrene: Secondary | ICD-10-CM | POA: Diagnosis not present

## 2017-11-29 DIAGNOSIS — E1122 Type 2 diabetes mellitus with diabetic chronic kidney disease: Secondary | ICD-10-CM | POA: Diagnosis not present

## 2017-11-30 ENCOUNTER — Other Ambulatory Visit: Payer: Self-pay

## 2017-11-30 ENCOUNTER — Ambulatory Visit: Payer: Medicare Other | Admitting: Family Medicine

## 2017-11-30 ENCOUNTER — Encounter: Payer: Self-pay | Admitting: Family Medicine

## 2017-11-30 VITALS — BP 144/94 | HR 122 | Temp 97.5°F | Wt 257.0 lb

## 2017-11-30 DIAGNOSIS — Z1239 Encounter for other screening for malignant neoplasm of breast: Secondary | ICD-10-CM

## 2017-11-30 DIAGNOSIS — Z1231 Encounter for screening mammogram for malignant neoplasm of breast: Secondary | ICD-10-CM

## 2017-11-30 NOTE — Progress Notes (Signed)
   Subjective:    Patient ID: Sylvia Craig, female    DOB: 1951-06-20, 66 y.o.   MRN: [id number]   CC: Follow-up on venous stasis  HPI: Patient is a 66 year old female with a complicated medical history who presents today to follow-up on recent clinic visit for venous stasis and lower extremity swelling.  Patient had Unna boots placed at last office visit.  Patient reports improvement in leg swelling.  Since last office visit patient was seen by wound care twice.  Patient also has nurse aid that has been changing the dressing two times a week.  Patient states that there is no deep lesions and has superficial wounds have been healing.  Patient denies any fever, chills, drainage or bleeding.  Smoking status reviewed   ROS: all other systems were reviewed and are negative other than in the HPI   Past Medical History:  Diagnosis Date  . [name] (coronary artery disease), native coronary artery   . CHF (congestive heart failure) (HCC)   . CKD (chronic kidney disease), stage III (HCC)   . Diabetes mellitus without complication (HCC)   . Hyperlipidemia LDL goal <70   . Hypertension   . Iron (Fe) deficiency anemia   . Thyroid disease     Past Surgical History:  Procedure Laterality Date  . APPLICATION OF A-CELL OF EXTREMITY Left 07/27/2017   Procedure: APPLICATION OF A-CELL;  Surgeon: Peggye Formillingham, Claire S, DO;  Location: [facility] OR;  Service: Plastics;  Laterality: Left;  . I&D EXTREMITY Left 07/24/2017   Procedure: IRRIGATION AND DEBRIDEMENT EXTREMITY;  Surgeon: Sheral ApleyMurphy, Timothy D, MD;  [facility];  Service: Orthopedics;  Laterality: Left;  . I&D EXTREMITY Left 07/27/2017   Procedure: IRRIGATION AND DEBRIDEMENT OF LEFT HAND;  Surgeon: Peggye Formillingham, Claire S, DO;  Location: [facility] OR;  Service: Plastics;  Laterality: Left;    Past medical history, surgical, family, and social history reviewed and updated in the EMR as appropriate.  Objective:  BP (!) 144/94   Pulse (!) 122   Temp (!) 97.5 F (36.4 C)  (Oral)   Wt 257 lb (116.6 kg)   SpO2 99% Comment: 2L O2  BMI 42.77 kg/m   Vitals and nursing note reviewed  General: NAD, pleasant, able to participate in exam Cardiac: RRR, normal heart sounds, no murmurs. 2+ radial and PT pulses bilaterally Respiratory: CTAB, normal effort, No wheezes, rales or rhonchi Abdomen: soft, nontender, nondistended, no hepatic or splenomegaly, +BS Extremities: Did not unwrap unna boots placed yesterday by wound care.  Much improved bilateral lower extremity swelling. Skin: warm and dry, no rashes noted Neuro: alert and oriented x4, no focal deficits Psych: Normal affect and mood   Assessment & Plan:   #Venous stasis/lower extremity swelling, improving Patient is closely followed by wound care, as well as nurse aide who has been changing dressing on a regular basis.  No obvious signs of infection.  We will continue to monitor.  #Health maintenance We will make ophthalmology referral Patient contact the breast center for mammogram     Lovena NeighboursAbdoulaye Edoardo Laforte, MD [facility] PGY-2

## 2017-11-30 NOTE — Patient Instructions (Addendum)
It was great seeing you today! We have addressed the following issues today  1. I will refer you to ophtalmology for your annual eye exam 2. I place an order for your mammogram. Please call the number given to make an appointment. 3. Continue with wound care, I am glad legs a looking much improved. 4. Continue with Physical therapy, will discuss cardiopulmonary rehab after PT. 5. Please schedule your annual wellness visit at the front desk.  If we did any lab work today, and the results require attention, either me or my nurse will get in touch with you. If everything is normal, you will get a letter in mail and a message via . If you don't hear from us in two weeks, please give[place] us a call. Otherwise, we look forward to seeing you again at your next visit. If you have any questions or concerns before then, please call the clinic at [phone].  Please bring all your medications to every doctors visit  Sign up for My Chart to have easy access to your labs results, and communication with your Primary care physician. Please ask Front Desk for some assistance.   Please check-out at the front desk before leaving the clinic.    Take Care,   Dr. Sydnee Cabaliallo

## 2017-12-01 ENCOUNTER — Other Ambulatory Visit: Payer: Self-pay | Admitting: Family Medicine

## 2017-12-01 DIAGNOSIS — Z1231 Encounter for screening mammogram for malignant neoplasm of breast: Secondary | ICD-10-CM

## 2017-12-04 NOTE — Procedures (Signed)
    Patient Name: Sylvia Craig, Sylvia Craig Study Date: 11/25/2017 Gender: Female D.O.B: [birth year] Age (years): [age] Referring Provider: Janit PaganKehinde Eniola Height (inches): 65 Interpreting Physician: Sylvia Duhamellinton Mildreth Reek MD, ABSM Weight (lbs): 252 RPSGT: Sylvia Craig, Sylvia Craig BMI: 42 MRN: [id number] Neck Size: 15.00 CLINICAL INFORMATION Sleep Study Type: NPSG  Indication for sleep study: Congestive Heart Failure, Morbid Obesity, Re-Evaluation  Epworth Sleepiness Score: 8  SLEEP STUDY TECHNIQUE As per the AASM Manual for the Scoring of Sleep and Associated Events v2.3 (April 2016) with a hypopnea requiring 4% desaturations.  The channels recorded and monitored were frontal, central and occipital EEG, electrooculogram (EOG), submentalis EMG (chin), nasal and oral airflow, thoracic and abdominal wall motion, anterior tibialis EMG, snore microphone, electrocardiogram, and pulse oximetry.  MEDICATIONS Medications self-administered by patient taken the night of the study : none reported  SLEEP ARCHITECTURE The study was initiated at 9:55:30 PM and ended at 4:34:48 AM.  Sleep onset time was 5.4 minutes and the sleep efficiency was 71.0%. The total sleep time was 283.5 minutes.  Stage REM latency was N/A minutes.  The patient spent 6.35% of the night in stage N1 sleep, 93.65% in stage N2 sleep, 0.00% in stage N3 and 0.00% in REM.  Alpha intrusion was absent.  Supine sleep was 0.00%.  RESPIRATORY PARAMETERS The overall apnea/hypopnea index (AHI) was 0.0 per hour. There were 0 total apneas, including 0 obstructive, 0 central and 0 mixed apneas. There were 0 hypopneas and 0 RERAs.  The AHI during Stage REM sleep was N/A per hour.  AHI while supine was N/A per hour.  The mean oxygen saturation was 93.84%. The minimum SpO2 during sleep was 86.00%.  moderate snoring was noted during this study.  CARDIAC DATA The 2 lead EKG demonstrated atrial fibrillation. The mean heart rate was 87.96 beats per minute.  Other EKG findings include: PVCs.  LEG MOVEMENT DATA The total PLMS were 0 with a resulting PLMS index of 0.00. Associated arousal with leg movement index was 0.0 .  IMPRESSIONS - No significant obstructive sleep apnea occurred during this study (AHI = 0.0/h). - No significant central sleep apnea occurred during this study (CAI = 0.0/h). - Moderate oxygen desaturation was noted during this study (Min O2 = 86.00%, Mean 93.8%). - The patient snored with moderate snoring volume. - EKG findings include A.Fib. - Clinically significant periodic limb movements did not occur during sleep. No significant associated arousals.  DIAGNOSIS - Primary Snoring (786.09 [R06.83 ICD-10])  RECOMMENDATIONS - Sleep hygiene should be reviewed to assess factors that may improve sleep quality. - Weight management and regular exercise should be initiated or continued if appropriate.  [Electronically signed] 12/04/2017 02:06 PM  Sylvia Duhamellinton Nason Conradt MD, ABSM Diplomate, American Board of Sleep Medicine   NPI: [id number]                         Sylvia Craig Diplomate, American Board of Sleep Medicine  ELECTRONICALLY SIGNED ON:  12/04/2017, 1:56 PM [facility] PH: [phone]   FX: [phone] ACCREDITED BY THE AMERICAN ACADEMY OF SLEEP MEDICINE

## 2017-12-06 ENCOUNTER — Other Ambulatory Visit: Payer: Self-pay | Admitting: *Deleted

## 2017-12-06 LAB — POCT INR: INR: 1.4

## 2017-12-06 NOTE — Progress Notes (Deleted)
Cardiology Office Note    Date:  12/06/2017   ID:  Sylvia NightDebbie Hershey, DOB [birth year], MRN [id number]  PCP:  Lovena Neighboursiallo, [name], MD  Cardiologist: Lesleigh NoeHenry W Idy Rawling III, MD   No chief complaint on file.   History of Present Illness:  Sylvia Craig is a 66 y.o. 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       Past Medical History:  Diagnosis Date  . [name] (coronary artery disease), native coronary artery   . CHF (congestive heart failure) (HCC)   . CKD (chronic kidney disease), stage III (HCC)   . Diabetes mellitus without complication (HCC)   . Hyperlipidemia LDL goal <70   . Hypertension   . Iron (Fe) deficiency anemia   . Thyroid disease     Past Surgical History:  Procedure Laterality Date  . APPLICATION OF A-CELL OF EXTREMITY Left 07/27/2017   Procedure: APPLICATION OF A-CELL;  Surgeon: Peggye Formillingham, Claire S, DO;  [facility]: [facility];  Service: Plastics;  Laterality: Left;  . I&D EXTREMITY Left 07/24/2017   Procedure: IRRIGATION AND DEBRIDEMENT EXTREMITY;  Surgeon: Sheral ApleyMurphy, Timothy D, MD;  [facility];  Service: Orthopedics;  Laterality: Left;  . I&D EXTREMITY Left 07/27/2017   Procedure: IRRIGATION AND DEBRIDEMENT OF LEFT HAND;  Surgeon: Peggye Formillingham, Claire S, DO;  Location: [facility] OR;  Service: Plastics;  Laterality: Left;    Current Medications: Outpatient Medications Prior to Visit  Medication Sig Dispense Refill  . acetaminophen (TYLENOL) 500 MG tablet Take 1,000 mg by mouth every 6 (six) hours as needed for moderate pain or headache.     . Cholecalciferol (VITAMIN D-1000 MAX ST) 1000 units tablet Take 1 tablet (1,000 Units total) by mouth at bedtime. 30 tablet 1  . collagenase (SANTYL) ointment Apply 1  application topically daily.    . [employer] MELATONIN 5 MG TABS Take 1 tablet (5 mg total) by mouth at bedtime as needed. for sleep 90 tablet 1  . Ferrous Fumarate (HEMOCYTE - 106 MG FE) 324 (106 Fe) MG TABS tablet Take 1 tablet (106 mg of iron total) by mouth 2 (two) times daily. 30 tablet 1  . furosemide (LASIX) 40 MG tablet Take 1 tablet (40 mg total) by mouth daily. May take additional 20mg  for leg swelling or shortness of breath. 120 tablet 3  . insulin aspart (NOVOLOG) 100 UNIT/ML injection Inject 0-9 Units into the skin 3 (three) times daily with meals.    . insulin glargine (LANTUS) 100 UNIT/ML injection Inject 0.12 mLs (12 Units total) into the skin at bedtime. 10 mL 11  . levothyroxine (SYNTHROID, LEVOTHROID) 137 MCG tablet Take 1 tablet (137 mcg total) by mouth daily before breakfast. 90 tablet 0  . metoprolol succinate (TOPROL-XL) 100 MG 24 hr tablet Take 1 tablet (100 mg total) by mouth daily. 30 tablet 3  . Multiple Vitamin (MULTIVITAMIN WITH MINERALS) TABS tablet Take 1 tablet by mouth daily. 30 tablet 3  . nitroGLYCERIN (NITROSTAT) 0.4 MG SL tablet Take 0.4 mg under the toungue as needed for chest pain 30 tablet 1  . rosuvastatin (CRESTOR) 10 MG tablet Take 1 tablet (10 mg total) by mouth at bedtime. 90 tablet 0  . senna-docusate (SENOKOT-S) 8.6-50 MG tablet Take 1 tablet by mouth  at bedtime as needed for mild constipation. 30 tablet 0  . vitamin C (ASCORBIC ACID) 500 MG tablet Take 2 tablets (1,000 mg total) by mouth at bedtime. 90 tablet 1  . warfarin (COUMADIN) 3 MG tablet Take 1 tab 4 days per week, and 0.5 tabs 3 days per week as instructed by home health nurse 30 tablet 2  . zinc sulfate 220 (50 Zn) MG capsule Take 1 capsule (220 mg total) by mouth daily. 30 capsule 0   No facility-administered medications prior to visit.      Allergies:   Codeine; Glucosamine; Oxycodone; Shellfish allergy; Shellfish-derived products; Atorvastatin; Lisinopril; Other; and Oxycodone-aspirin    Social History   Socioeconomic History  . Marital status: Legally Separated    Spouse name: Not on file  . Number of children: Not on file  . Years of education: Not on file  . Highest education level: Not on file  Social Needs  . Financial resource strain: Not on file  . Food insecurity - worry: Not on file  . Food insecurity - inability: Not on file  . Transportation needs - medical: Not on file  . Transportation needs - non-medical: Not on file  Occupational History  . Not on file  Tobacco Use  . Smoking status: Former Smoker    Packs/day: 1.00    Types: Cigarettes    Last attempt to quit: 02/24/1988    Years since quitting: 29.8  . Smokeless tobacco: Never Used  Substance and Sexual Activity  . Alcohol use: No  . Drug use: No  . Sexual activity: Not on file  Other Topics Concern  . Not on file  Social History Narrative  . Not on file     Family History:  The patient's ***family history includes [name] in her mother; Cancer in her father; Dementia in her mother.   ROS:   Please see the history of present illness.    ***  All other systems reviewed and are negative.   PHYSICAL EXAM:   VS:  There were no vitals taken for this visit.   GEN: Well nourished, well developed, in no acute distress  HEENT: normal  Neck: no JVD, carotid bruits, or masses Cardiac: ***RRR; no murmurs, rubs, or gallops,no edema  Respiratory:  clear to auscultation bilaterally, normal work of breathing GI: soft, nontender, nondistended, + BS MS: no deformity or atrophy  Skin: warm and dry, no rash Neuro:  Alert and Oriented x 3, Strength and sensation are intact Psych: euthymic mood, full affect  Wt Readings from Last 3 Encounters:  11/30/17 257 lb (116.6 kg)  11/25/17 252 lb (114.3 kg)  11/03/17 260 lb (117.9 kg)      Studies/Labs Reviewed:   EKG:  EKG  ***  Recent Labs: 08/25/2017: B Natriuretic Peptide 396.5 10/07/2017: ALT 14; Hemoglobin 9.4; Platelets 261 11/03/2017: BUN  30; Creatinine, Ser 1.71; Potassium 4.4; Sodium 141   Lipid Panel    Component Value Date/Time   CHOL 66 08/27/2017 0303   TRIG 91 08/27/2017 0303   HDL <10 (L) 08/27/2017 0303   CHOLHDL NOT CALCULATED 08/27/2017 0303   VLDL 18 08/27/2017 0303   LDLCALC NOT CALCULATED 08/27/2017 0303    Additional studies/ records that were reviewed today include:  ***    ASSESSMENT:    1. Coronary artery disease involving native coronary artery of native heart with angina pectoris (HCC)   2. (HFpEF) heart failure with preserved ejection fraction (HCC)   3. Chronic atrial fibrillation (HCC)  4. Obstructive sleep apnea   5. Type 2 diabetes mellitus with chronic kidney disease, with long-term current use of insulin, unspecified CKD stage (HCC)   6. CKD (chronic kidney disease) stage 3, GFR 30-59 ml/min (HCC)   7. Hypoxemia      PLAN:  In order of problems listed above:  1. ***    Medication Adjustments/Labs and Tests Ordered: Current medicines are reviewed at length with the patient today.  Concerns regarding medicines are outlined above.  Medication changes, Labs and Tests ordered today are listed in the Patient Instructions below. There are no Patient Instructions on file for this visit.   Signed, Lesleigh Noe, MD  12/06/2017 3:27 PM    [facility] [address] [place], [place], [place]  [address] Phone: [phone]; Fax: [phone]

## 2017-12-07 ENCOUNTER — Ambulatory Visit: Payer: Medicare Other | Admitting: Interventional Cardiology

## 2017-12-09 ENCOUNTER — Ambulatory Visit: Payer: Medicare Other | Admitting: Cardiovascular Disease

## 2017-12-12 ENCOUNTER — Other Ambulatory Visit: Payer: Self-pay | Admitting: *Deleted

## 2017-12-12 LAB — POCT INR: INR: 2.1

## 2017-12-14 ENCOUNTER — Ambulatory Visit: Payer: Medicare Other | Admitting: Cardiovascular Disease

## 2017-12-14 DIAGNOSIS — E11622 Type 2 diabetes mellitus with other skin ulcer: Secondary | ICD-10-CM | POA: Diagnosis not present

## 2017-12-21 ENCOUNTER — Other Ambulatory Visit: Payer: Self-pay | Admitting: *Deleted

## 2017-12-21 DIAGNOSIS — E11622 Type 2 diabetes mellitus with other skin ulcer: Secondary | ICD-10-CM | POA: Diagnosis not present

## 2017-12-21 LAB — POCT INR: INR: 3.3

## 2017-12-28 ENCOUNTER — Encounter (HOSPITAL_BASED_OUTPATIENT_CLINIC_OR_DEPARTMENT_OTHER): Payer: Medicare Other | Attending: Physician Assistant

## 2017-12-28 DIAGNOSIS — E1140 Type 2 diabetes mellitus with diabetic neuropathy, unspecified: Secondary | ICD-10-CM | POA: Insufficient documentation

## 2017-12-28 DIAGNOSIS — E1151 Type 2 diabetes mellitus with diabetic peripheral angiopathy without gangrene: Secondary | ICD-10-CM | POA: Diagnosis not present

## 2017-12-28 DIAGNOSIS — L97819 Non-pressure chronic ulcer of other part of right lower leg with unspecified severity: Secondary | ICD-10-CM | POA: Diagnosis not present

## 2017-12-28 DIAGNOSIS — E11622 Type 2 diabetes mellitus with other skin ulcer: Secondary | ICD-10-CM | POA: Diagnosis present

## 2017-12-28 DIAGNOSIS — I509 Heart failure, unspecified: Secondary | ICD-10-CM | POA: Diagnosis not present

## 2017-12-28 DIAGNOSIS — L97829 Non-pressure chronic ulcer of other part of left lower leg with unspecified severity: Secondary | ICD-10-CM | POA: Insufficient documentation

## 2017-12-28 DIAGNOSIS — I890 Lymphedema, not elsewhere classified: Secondary | ICD-10-CM | POA: Insufficient documentation

## 2017-12-28 DIAGNOSIS — S91109A Unspecified open wound of unspecified toe(s) without damage to nail, initial encounter: Secondary | ICD-10-CM | POA: Insufficient documentation

## 2017-12-28 DIAGNOSIS — N186 End stage renal disease: Secondary | ICD-10-CM | POA: Diagnosis not present

## 2017-12-28 DIAGNOSIS — I132 Hypertensive heart and chronic kidney disease with heart failure and with stage 5 chronic kidney disease, or end stage renal disease: Secondary | ICD-10-CM | POA: Diagnosis not present

## 2017-12-28 DIAGNOSIS — I739 Peripheral vascular disease, unspecified: Secondary | ICD-10-CM | POA: Diagnosis not present

## 2017-12-28 DIAGNOSIS — E1122 Type 2 diabetes mellitus with diabetic chronic kidney disease: Secondary | ICD-10-CM | POA: Insufficient documentation

## 2017-12-28 DIAGNOSIS — G4730 Sleep apnea, unspecified: Secondary | ICD-10-CM | POA: Diagnosis not present

## 2017-12-28 DIAGNOSIS — X58XXXA Exposure to other specified factors, initial encounter: Secondary | ICD-10-CM | POA: Insufficient documentation

## 2017-12-30 ENCOUNTER — Other Ambulatory Visit: Payer: Self-pay | Admitting: Cardiovascular Disease

## 2017-12-30 ENCOUNTER — Encounter: Payer: Self-pay | Admitting: Cardiovascular Disease

## 2017-12-30 ENCOUNTER — Ambulatory Visit: Payer: Medicare Other | Admitting: Cardiovascular Disease

## 2017-12-30 VITALS — BP 135/92 | HR 99 | Ht 65.0 in | Wt 249.8 lb

## 2017-12-30 DIAGNOSIS — L97919 Non-pressure chronic ulcer of unspecified part of right lower leg with unspecified severity: Secondary | ICD-10-CM

## 2017-12-30 DIAGNOSIS — I83019 Varicose veins of right lower extremity with ulcer of unspecified site: Secondary | ICD-10-CM

## 2017-12-30 DIAGNOSIS — S81809A Unspecified open wound, unspecified lower leg, initial encounter: Secondary | ICD-10-CM

## 2017-12-30 DIAGNOSIS — L97929 Non-pressure chronic ulcer of unspecified part of left lower leg with unspecified severity: Secondary | ICD-10-CM | POA: Diagnosis not present

## 2017-12-30 DIAGNOSIS — I83029 Varicose veins of left lower extremity with ulcer of unspecified site: Secondary | ICD-10-CM

## 2017-12-30 DIAGNOSIS — I739 Peripheral vascular disease, unspecified: Secondary | ICD-10-CM

## 2017-12-30 DIAGNOSIS — I872 Venous insufficiency (chronic) (peripheral): Secondary | ICD-10-CM | POA: Insufficient documentation

## 2017-12-30 NOTE — Assessment & Plan Note (Signed)
[name] was referred to me by Dr Sylvia Craig, wound care physician, for what appears to be venous stasis ulcers with superimposed cellulitis. Dr. Garnette ScheuermannHank Craig is her cardiologist. She has a history of [name], hypertension, hyperlipidemia, long history of insulin-dependent diabetes and stage III CKD on 2/4 7 home O2. She's had venous stasis changes for years but only recently has noticed skin breakdown with weeping and superimposed cellulitis. ABIs were performed at work and accurate because of noncompressible vessels. 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 Ambulate to get lotion me arterial Doppler studies but doubt that I have anything to offer her interventionally.

## 2017-12-30 NOTE — Addendum Note (Signed)
Addended by: Izic Stfort L on: 12/30/2017 04:02 PM   Modules accepted: Orders  

## 2017-12-30 NOTE — Patient Instructions (Addendum)
Medication Instructions: Your physician recommends that you continue on your current medications as directed. Please refer to the Current Medication list given to you today.   Testing/Procedures: Your physician has requested that you have a lower extremity arterial duplex. During this test, ultrasound is used to evaluate arterial blood flow in the legs. Allow one hour for this exam. There are no restrictions or special instructions.   Follow-Up: Your physician wants you to follow-up in: 6 months with Dr. Allyson SabalBerry. You will receive a reminder letter in the mail two months in advance. If you don't receive a letter, please call our office to schedule the follow-up appointment.  If you need a refill on your cardiac medications before your next appointment, please call your pharmacy.

## 2017-12-30 NOTE — Progress Notes (Signed)
12/30/2017 Sylvia Craig   1951/06/12  [id number]  Primary Physician Sylvia Neighboursiallo, [name], MD Primary Cardiologist: Sylvia GessJonathan J Jemario Poitras MD Sylvia Craig, FACC, FAHA, Sylvia Craig  HPI:  Sylvia Craig is a 67 y.o. severely overweight soon-to-be divorced Caucasian female mother of one, grandmother and one grandchild who is retired Acupuncturistoccupational therapist. She was referred to me by Sylvia Craig for peripheral vascular evaluation because of venous stasis ulcers. Her cardiologist is Sylvia Craig. She has a history of [name] status [facility] revealing three-vessel disease with occluded circumflex and RCA and moderate LAD disease not amenable to intervention. Medical therapy was recommended. She does have moderate LV dysfunction with an EF in the 45-50% range as well as diastolic dysfunction. History of hypertension, hyperlipidemia and a long history of insulin-dependent diabetes. She is on home O2 2 L 24/7. She's had venous stasis changes in both legs for years. She has had skin breakdown in the last couple months. ABIs were inaccurate because of noncompressible vessels.    Current Meds  Medication Sig  . acetaminophen (TYLENOL) 500 MG tablet Take 1,000 mg by mouth every 6 (six) hours as needed for moderate pain or headache.   . Cholecalciferol (VITAMIN D-1000 MAX ST) 1000 units tablet Take 1 tablet (1,000 Units total) by mouth at bedtime.  . ferrous sulfate 325 (65 FE) MG EC tablet Take 325 mg by mouth 2 (two) times daily.  . furosemide (LASIX) 40 MG tablet Take 1 tablet (40 mg total) by mouth daily. May take additional 20mg  for leg swelling or shortness of breath.  . insulin aspart (NOVOLOG) 100 UNIT/ML injection Inject 0-9 Units into the skin 3 (three) times daily with meals.  . insulin glargine (LANTUS) 100 UNIT/ML injection Inject 0.12 mLs (12 Units total) into the skin at bedtime.  Marland Kitchen. levothyroxine (SYNTHROID, LEVOTHROID) 137 MCG tablet Take 1 tablet (137 mcg  total) by mouth daily before breakfast.  . metoprolol succinate (TOPROL-XL) 100 MG 24 hr tablet Take 1 tablet (100 mg total) by mouth daily.  . Multiple Vitamin (MULTIVITAMIN WITH MINERALS) TABS tablet Take 1 tablet by mouth daily.  . nitroGLYCERIN (NITROSTAT) 0.4 MG SL tablet Take 0.4 mg under the toungue as needed for chest pain  . rosuvastatin (CRESTOR) 10 MG tablet Take 1 tablet (10 mg total) by mouth at bedtime.  . senna-docusate (SENOKOT-S) 8.6-50 MG tablet Take 1 tablet by mouth at bedtime as needed for mild constipation.  . vitamin C (ASCORBIC ACID) 500 MG tablet Take 2 tablets (1,000 mg total) by mouth at bedtime.  Marland Kitchen. warfarin (COUMADIN) 3 MG tablet Take 1 tab 4 days per week, and 0.5 tabs 3 days per week as instructed by home health nurse  . zinc sulfate 220 (50 Zn) MG capsule Take 1 capsule (220 mg total) by mouth daily.     Allergies  Allergen Reactions  . Codeine Other (See Comments) and Rash    Rash, edema Rash, edema  . Glucosamine Shortness Of Breath  . Oxycodone Rash    Rash, dreams  . Shellfish Allergy Shortness Of Breath  . Shellfish-Derived Products Shortness Of Breath  . Atorvastatin Other (See Comments)    Myalgias  Myalgias   . Lisinopril Other (See Comments)    Cough  Cough   . Other Other (See Comments)    Crab, chest tightness  . Oxycodone-Aspirin Other (See Comments)    Rash, dreams    Social History   Socioeconomic History  . Marital  status: Legally Separated    Spouse name: Not on file  . Number of children: Not on file  . Years of education: Not on file  . Highest education level: Not on file  Social Needs  . Financial resource strain: Not on file  . Food insecurity - worry: Not on file  . Food insecurity - inability: Not on file  . Transportation needs - medical: Not on file  . Transportation needs - non-medical: Not on file  Occupational History  . Not on file  Tobacco Use  . Smoking status: Former Smoker    Packs/day: 1.00    Types:  Cigarettes    Last attempt to quit: 02/24/1988    Years since quitting: 29.8  . Smokeless tobacco: Never Used  Substance and Sexual Activity  . Alcohol use: No  . Drug use: No  . Sexual activity: Not on file  Other Topics Concern  . Not on file  Social History Narrative  . Not on file     Review of Systems: General: negative for chills, fever, night sweats or weight changes.  Cardiovascular: negative for chest pain, dyspnea on exertion, edema, orthopnea, palpitations, paroxysmal nocturnal dyspnea or shortness of breath Dermatological: negative for rash Respiratory: negative for cough or wheezing Urologic: negative for hematuria Abdominal: negative for nausea, vomiting, diarrhea, bright red blood per rectum, melena, or hematemesis Neurologic: negative for visual changes, syncope, or dizziness All other systems reviewed and are otherwise negative except as noted above.    Blood pressure (!) 135/92, pulse 99, height 5\' 5"  (1.651 m), weight 249 lb 12.8 oz (113.3 kg).  General appearance: alert and no distress Neck: no adenopathy, no carotid bruit, no JVD, supple, symmetrical, trachea midline and thyroid not enlarged, symmetric, no tenderness/mass/nodules Lungs: clear to auscultation bilaterally Heart: regular rate and rhythm, S1, S2 normal, no murmur, click, rub or gallop Extremities: Venous stasis changes, chronic desquamation of skin bilaterally, there was discoloration with venous stasis ulceration left greater than right and absent pedal pulses. Pulses: Absent pulses bilaterally Skin: Venous stasis changes bilaterally with skin breakdown on her calves left greater than right Neurologic: Alert and oriented X 3, normal strength and tone. Normal symmetric reflexes. Normal coordination and gait  EKG atrial fibrillation with a ventricular response of 99 and nonspecific QRS widening. I personally reviewed this EKG.  ASSESSMENT AND PLAN:   Venous stasis ulcers of both lower  extremities (HCC) Sylvia Craig was referred to me by Sylvia Sylvia Kanner, wound care physician, for what appears to be venous stasis ulcers with superimposed cellulitis. Sylvia. Garnette Scheuermann is her cardiologist. She has a history of [name], hypertension, hyperlipidemia, long history of insulin-dependent diabetes and stage III CKD on 2/4 7 home O2. She's had venous stasis changes for years but only recently has noticed skin breakdown with weeping and superimposed cellulitis. ABIs were performed at work and accurate because of noncompressible vessels. 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 Ambulate to get lotion me arterial Doppler studies but doubt that I have anything to offer her interventionally.      Sylvia Gess MD FACP,FACC,FAHA, [facility] 12/30/2017 12:34 PM

## 2018-01-02 ENCOUNTER — Other Ambulatory Visit: Payer: Self-pay | Admitting: *Deleted

## 2018-01-02 LAB — POCT INR: INR: 3.2

## 2018-01-05 ENCOUNTER — Other Ambulatory Visit: Payer: Self-pay

## 2018-01-05 ENCOUNTER — Ambulatory Visit: Payer: Medicare Other | Admitting: Family Medicine

## 2018-01-05 ENCOUNTER — Encounter: Payer: Self-pay | Admitting: Family Medicine

## 2018-01-05 VITALS — BP 140/86 | HR 80 | Temp 98.4°F | Ht 65.0 in | Wt 249.0 lb

## 2018-01-05 DIAGNOSIS — I83029 Varicose veins of left lower extremity with ulcer of unspecified site: Secondary | ICD-10-CM | POA: Diagnosis not present

## 2018-01-05 DIAGNOSIS — I83019 Varicose veins of right lower extremity with ulcer of unspecified site: Secondary | ICD-10-CM

## 2018-01-05 DIAGNOSIS — L97929 Non-pressure chronic ulcer of unspecified part of left lower leg with unspecified severity: Secondary | ICD-10-CM

## 2018-01-05 DIAGNOSIS — L97919 Non-pressure chronic ulcer of unspecified part of right lower leg with unspecified severity: Secondary | ICD-10-CM | POA: Diagnosis not present

## 2018-01-05 NOTE — Patient Instructions (Signed)

## 2018-01-05 NOTE — Progress Notes (Signed)
Subjective:    Patient ID: Sylvia Craig, female    DOB: 1951/05/09, 67 y.o.   MRN: [id number]   CC: Follow-up lower extremity swelling/wounds  HPI: Patient is a 67 year old female with a complex past medical history who presents today to follow-up on lower extremity swelling/wounds.  Patient has been seen by wound care on multiple occasions in the past few weeks.  Patient reports significant improvement in lower extremity swelling with the help of wound physician Dr.Britto as well as home nurse.  Venous stasis ulcers have improved.  Patient was recently seen by interventional cardiology for further evaluation of PAD.  Previous ABI readings were inaccurate due to noncompressible vessels.  Arterial Doppler studies were ordered however patient had to push back her appointment due to incompatibility with her schedule.  Doppler is scheduled for next Friday (1/18).  Patient continue to ambulate with cane and walker.  Smoking status reviewed   ROS: all other systems were reviewed and are negative other than in the HPI   Past Medical History:  Diagnosis Date  . [name] (coronary artery disease), native coronary artery   . CHF (congestive heart failure) (HCC)   . CKD (chronic kidney disease), stage III (HCC)   . Diabetes mellitus without complication (HCC)   . Hyperlipidemia LDL goal <70   . Hypertension   . Iron (Fe) deficiency anemia   . Thyroid disease     Past Surgical History:  Procedure Laterality Date  . APPLICATION OF A-CELL OF EXTREMITY Left 07/27/2017   Procedure: APPLICATION OF A-CELL;  Surgeon: Peggye Form, DO;  Location: [facility] OR;  Service: Plastics;  Laterality: Left;  . I&D EXTREMITY Left 07/24/2017   Procedure: IRRIGATION AND DEBRIDEMENT EXTREMITY;  Surgeon: Sheral Apley, MD;  Location: [facility] OR;  Service: Orthopedics;  Laterality: Left;  . I&D EXTREMITY Left 07/27/2017   Procedure: IRRIGATION AND DEBRIDEMENT OF LEFT HAND;  Surgeon: Peggye Form, DO;  Location: [facility] OR;   Service: Plastics;  Laterality: Left;    Past medical history, surgical, family, and social history reviewed and updated in the EMR as appropriate.  Objective:  BP 140/86   Pulse 80   Temp 98.4 F (36.9 C) (Oral)   Ht 5\' 5"  (1.651 m)   Wt 249 lb (112.9 kg)   SpO2 99% Comment: on 2 liters  BMI 41.44 kg/m   Vitals and nursing note reviewed  General: NAD, pleasant, able to participate in exam Cardiac: RRR, normal heart sounds, no murmurs. 2+ radial and PT pulses bilaterally Respiratory: CTAB, normal effort, No wheezes, rales or rhonchi Abdomen: soft, nontender, nondistended, no hepatic or splenomegaly, +BS Extremities: Bilateral lower extremity swelling, some hyperpigmentation and mild erythema noted.  Currently wearing Unna boots.  No visible lesion noted on exam. Neuro: alert and oriented x4, no focal deficits Psych: Normal affect and mood   Assessment & Plan:   #Lower extremity venous stasis, improving Patient was recently seen by interventional cardiology for further assessment of PAD.  Pulses were not palpated which was concerning for possible tibial disease from uncontrolled diabetes.  Patient has a schedule arterial Doppler to further evaluate extent of vascular disease.  Patient cardiology assessment patient is not a candidate for any intervention at this time.  Patient will continue to follow-up with wound care.  There has been discussion of transitioning from Unna boots to compression stocking.  However given the concern of PAD, wound care will need vascular clearance.  Patient will follow-up in about a month to discuss  results from arterial Doppler and wound care recommendation.  Patient continue to wear wound boots with frequent dressing changes she will also continue to elevate leg.    Lovena NeighboursAbdoulaye Ernestene Coover, MD [facility] PGY-2

## 2018-01-06 ENCOUNTER — Ambulatory Visit: Payer: Medicare Other | Admitting: Cardiovascular Disease

## 2018-01-06 ENCOUNTER — Inpatient Hospital Stay (HOSPITAL_COMMUNITY): Admission: RE | Admit: 2018-01-06 | Payer: Medicare Other | Source: Ambulatory Visit

## 2018-01-11 ENCOUNTER — Other Ambulatory Visit (HOSPITAL_COMMUNITY)
Admission: RE | Admit: 2018-01-11 | Discharge: 2018-01-11 | Disposition: A | Discharge status: Home / Self Care | Payer: Medicare Other | Source: Other Acute Inpatient Hospital | Attending: Physician Assistant | Admitting: Physician Assistant

## 2018-01-11 DIAGNOSIS — E11622 Type 2 diabetes mellitus with other skin ulcer: Secondary | ICD-10-CM | POA: Diagnosis not present

## 2018-01-11 DIAGNOSIS — L97222 Non-pressure chronic ulcer of left calf with fat layer exposed: Secondary | ICD-10-CM | POA: Insufficient documentation

## 2018-01-13 ENCOUNTER — Ambulatory Visit (HOSPITAL_COMMUNITY)
Admission: RE | Admit: 2018-01-13 | Payer: Medicare Other | Source: Ambulatory Visit | Attending: Cardiovascular Disease | Admitting: Cardiovascular Disease

## 2018-01-13 ENCOUNTER — Telehealth: Payer: Self-pay | Admitting: *Deleted

## 2018-01-13 NOTE — Telephone Encounter (Signed)
Returned patient's call concerning antibiotic use with coumadin. She was started on Sulfamethoxazole/Trimethoprim yesterday. Her current dose of coumadin is 3 mg Mon, Wed, Fri and 1.5 mg all other days. Per Dr Leveda AnnaHensel, patient was instructed to take 1.5 mg of her coumadin daily while on the antibiotic. Home health nurse is scheduled to check her coumadin Monday or Tuesday next week, will make any adjustments to her coumadin dosing if needed based on those results. Katrinna Travieso, Rodena Medinobert Lee

## 2018-01-14 LAB — AEROBIC CULTURE  (SUPERFICIAL SPECIMEN)

## 2018-01-14 LAB — AEROBIC CULTURE W GRAM STAIN (SUPERFICIAL SPECIMEN)

## 2018-01-16 ENCOUNTER — Other Ambulatory Visit: Payer: Self-pay | Admitting: *Deleted

## 2018-01-16 LAB — POCT INR: INR: 2.8

## 2018-01-20 ENCOUNTER — Other Ambulatory Visit: Payer: Self-pay | Admitting: Family Medicine

## 2018-01-20 DIAGNOSIS — Z09 Encounter for follow-up examination after completed treatment for conditions other than malignant neoplasm: Secondary | ICD-10-CM

## 2018-01-23 ENCOUNTER — Other Ambulatory Visit: Payer: Self-pay | Admitting: *Deleted

## 2018-01-23 LAB — POCT INR: INR: 1.9

## 2018-01-25 DIAGNOSIS — E11622 Type 2 diabetes mellitus with other skin ulcer: Secondary | ICD-10-CM | POA: Diagnosis not present

## 2018-01-26 ENCOUNTER — Ambulatory Visit (HOSPITAL_COMMUNITY)
Admission: RE | Admit: 2018-01-26 | Discharge: 2018-01-26 | Disposition: A | Discharge status: Home / Self Care | Payer: Medicare Other | Source: Ambulatory Visit | Attending: Cardiovascular Disease | Admitting: Cardiovascular Disease

## 2018-01-26 DIAGNOSIS — S81809A Unspecified open wound, unspecified lower leg, initial encounter: Secondary | ICD-10-CM | POA: Insufficient documentation

## 2018-01-26 DIAGNOSIS — X58XXXA Exposure to other specified factors, initial encounter: Secondary | ICD-10-CM | POA: Insufficient documentation

## 2018-01-26 DIAGNOSIS — I739 Peripheral vascular disease, unspecified: Secondary | ICD-10-CM

## 2018-01-27 ENCOUNTER — Encounter: Payer: Self-pay | Admitting: Interventional Cardiology

## 2018-01-27 ENCOUNTER — Ambulatory Visit: Payer: Medicare Other | Admitting: Interventional Cardiology

## 2018-01-27 VITALS — BP 122/84 | HR 90 | Ht 65.0 in | Wt 248.6 lb

## 2018-01-27 DIAGNOSIS — G4733 Obstructive sleep apnea (adult) (pediatric): Secondary | ICD-10-CM | POA: Diagnosis not present

## 2018-01-27 DIAGNOSIS — I482 Chronic atrial fibrillation, unspecified: Secondary | ICD-10-CM

## 2018-01-27 DIAGNOSIS — J9610 Chronic respiratory failure, unspecified whether with hypoxia or hypercapnia: Secondary | ICD-10-CM | POA: Diagnosis not present

## 2018-01-27 DIAGNOSIS — I25119 Atherosclerotic heart disease of native coronary artery with unspecified angina pectoris: Secondary | ICD-10-CM | POA: Diagnosis not present

## 2018-01-27 DIAGNOSIS — I5030 Unspecified diastolic (congestive) heart failure: Secondary | ICD-10-CM | POA: Diagnosis not present

## 2018-01-27 NOTE — Patient Instructions (Signed)

## 2018-01-27 NOTE — Progress Notes (Signed)
Cardiology Office Note    Date:  01/27/2018   ID:  Sylvia Craig, DOB 1951-11-29, MRN [id number]  PCP:  [name], MD  Cardiologist: Lesleigh Noe, MD   Chief Complaint  Patient presents with  . Congestive Heart Failure  . Atrial Fibrillation    History of Present Illness:  Sylvia Craig is a 67 y.o. 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    Doing okay.  Denies chest pain.  Still on oxygen therapy.  Moderate orthopnea.  Lower extremity swelling is improving.  Known coronary disease with totally occluded circumflex and moderate disease in the LAD.  No angina.  She wonders if she would ever get off of oxygen therapy.  She has chronic lung disease the etiology is uncertain.  Atrial fibrillation has excellent rate control without evidence of volume overload on the current medical regimen.   Past Medical History:  Diagnosis Date  . [name] (coronary artery disease), native coronary artery   . CHF (congestive heart failure) (HCC)   . CKD (chronic kidney disease), stage III (HCC)   . Diabetes mellitus without complication (HCC)   . Hyperlipidemia LDL goal <70   . Hypertension   . Iron (Fe) deficiency anemia   . Thyroid disease     Past Surgical History:  Procedure Laterality Date  . APPLICATION OF A-CELL OF EXTREMITY Left 07/27/2017   Procedure: APPLICATION OF A-CELL;  Surgeon: Peggye Form, DO;  Location: [facility] OR;  Service: Plastics;  Laterality: Left;  . I&D EXTREMITY Left 07/24/2017   Procedure: IRRIGATION AND DEBRIDEMENT EXTREMITY;  Surgeon: Sheral Apley, MD;  Location: [facility] OR;  Service: Orthopedics;  Laterality: Left;  . I&D EXTREMITY Left 07/27/2017   Procedure: IRRIGATION AND DEBRIDEMENT OF LEFT HAND;  Surgeon: Peggye Form, DO;  Location: [facility] OR;   Service: Plastics;  Laterality: Left;    Current Medications: Outpatient Medications Prior to Visit  Medication Sig Dispense Refill  . acetaminophen (TYLENOL) 500 MG tablet Take 1,000 mg by mouth every 6 (six) hours as needed for moderate pain or headache.     . cholecalciferol (VITAMIN D) 1000 units tablet TAKE 1 TABLET (1,000 UNITS TOTAL) BY MOUTH AT BEDTIME. 30 tablet 1  . ferrous sulfate 325 (65 FE) MG EC tablet Take 325 mg by mouth 2 (two) times daily.    . insulin aspart (NOVOLOG) 100 UNIT/ML injection Inject 0-9 Units into the skin 3 (three) times daily with meals.    . insulin glargine (LANTUS) 100 UNIT/ML injection Inject 0.12 mLs (12 Units total) into the skin at bedtime. 10 mL 11  . levothyroxine (SYNTHROID, LEVOTHROID) 137 MCG tablet Take 1 tablet (137 mcg total) by mouth daily before breakfast. 90 tablet 0  . metoprolol succinate (TOPROL-XL) 100 MG 24 hr tablet Take 1 tablet (100 mg total) by mouth daily. 30 tablet 3  . Multiple Vitamin (MULTIVITAMIN WITH MINERALS) TABS tablet Take 1 tablet by mouth daily. 30 tablet 3  . nitroGLYCERIN (NITROSTAT) 0.4 MG SL tablet Take 0.4 mg under the toungue as needed for chest pain 30 tablet 1  . rosuvastatin (CRESTOR) 10 MG tablet Take 1 tablet (10 mg total) by mouth at bedtime. 90 tablet 0  . senna-docusate (SENOKOT-S) 8.6-50 MG tablet Take 1 tablet by mouth at bedtime as needed for mild  constipation. 30 tablet 0  . vitamin C (ASCORBIC ACID) 500 MG tablet Take 2 tablets (1,000 mg total) by mouth at bedtime. 90 tablet 1  . warfarin (COUMADIN) 3 MG tablet Take 1 tab 4 days per week, and 0.5 tabs 3 days per week as instructed by home health nurse 30 tablet 2  . zinc sulfate 220 (50 Zn) MG capsule Take 1 capsule (220 mg total) by mouth daily. 30 capsule 0  . furosemide (LASIX) 40 MG tablet Take 1 tablet (40 mg total) by mouth daily. May take additional 20mg  for leg swelling or shortness of breath. 120 tablet 3   No facility-administered medications  prior to visit.      Allergies:   Codeine; Glucosamine; Oxycodone; Shellfish allergy; Shellfish-derived products; Atorvastatin; Lisinopril; Other; and Oxycodone-aspirin   Social History   Socioeconomic History  . Marital status: Legally Separated    Spouse name: None  . Number of children: None  . Years of education: None  . Highest education level: None  Social Needs  . Financial resource strain: None  . Food insecurity - worry: None  . Food insecurity - inability: None  . Transportation needs - medical: None  . Transportation needs - non-medical: None  Occupational History  . None  Tobacco Use  . Smoking status: Former Smoker    Packs/day: 1.00    Types: Cigarettes    Last attempt to quit: 02/24/1988    Years since quitting: 29.9  . Smokeless tobacco: Never Used  Substance and Sexual Activity  . Alcohol use: No  . Drug use: No  . Sexual activity: None  Other Topics Concern  . None  Social History Narrative  . None     Family History:  The patient's family history includes [name] in her mother; Cancer in her father; Dementia in her mother; Heart disease in her mother; Hypertension in her mother; Stroke in her mother.   ROS:   Please see the history of present illness.    Leg swelling, fatigue, irregular heartbeat, rash on lower extremities, difficulty with balance, requires a walker. All other systems reviewed and are negative.   PHYSICAL EXAM:   VS:  BP 122/84   Pulse 90   Ht 5\' 5"  (1.651 m)   Wt 248 lb 9.6 oz (112.8 kg)   BMI 41.37 kg/m    GEN: Well nourished, well developed, in no acute distress.  Morbidly obese. HEENT: normal  Neck: no JVD, carotid bruits, or masses Cardiac: IIRR; no murmurs, rubs, or gallops,no edema  Respiratory:  clear to auscultation bilaterally, normal work of breathing GI: soft, nontender, nondistended, + BS MS: no deformity or atrophy  Skin: warm and dry, no rash Neuro:  Alert and Oriented x 3, Strength and sensation are  intact Psych: euthymic mood, full affect  Wt Readings from Last 3 Encounters:  01/27/18 248 lb 9.6 oz (112.8 kg)  01/05/18 249 lb (112.9 kg)  12/30/17 249 lb 12.8 oz (113.3 kg)      Studies/Labs Reviewed:   EKG:  EKG not repeated.  Recent Labs: 08/25/2017: B Natriuretic Peptide 396.5 10/07/2017: ALT 14; Hemoglobin 9.4; Platelets 261 11/03/2017: BUN 30; Creatinine, Ser 1.71; Potassium 4.4; Sodium 141   Lipid Panel    Component Value Date/Time   CHOL 66 08/27/2017 0303   TRIG 91 08/27/2017 0303   HDL <10 (L) 08/27/2017 0303   CHOLHDL NOT CALCULATED 08/27/2017 0303   VLDL 18 08/27/2017 0303   LDLCALC NOT CALCULATED 08/27/2017 0303  Additional studies/ records that were reviewed today include:  Prior cardiac studies are outlined in the previous note by BB at office visit 11/11/2017.    ASSESSMENT:    1. (HFpEF) heart failure with preserved ejection fraction (HCC)   2. Chronic atrial fibrillation (HCC)   3. Coronary artery disease involving native coronary artery of native heart with angina pectoris (HCC)   4. Obstructive sleep apnea   5. Chronic respiratory failure, unspecified whether with hypoxia or hypercapnia (HCC)      PLAN:  In order of problems listed above:  1. Continue current therapy.  No evidence of volume overload. 2. Continue current medication.  Rate control is adequate on metoprolol succinate 100 mg/day.  Strategy is rate control.  Continue chronic anticoagulation therapy to prevent embolic CVA. 3. Asymptomatic with reference to angina. 4. There is a question of whether she has sleep apnea not.  The most recent study did not demonstrate sleep apnea.  She had a previous diagnosis of the problem. 5. O2 dependent respiratory failure of uncertain etiology.  Probably needs to have pulmonary consultation.  Clinical follow-up for atrial fibrillation in 6 months.  No change in therapy.  Medication Adjustments/Labs and Tests Ordered: Current medicines are  reviewed at length with the patient today.  Concerns regarding medicines are outlined above.  Medication changes, Labs and Tests ordered today are listed in the Patient Instructions below. There are no Patient Instructions on file for this visit.   Signed, Lesleigh Noe, MD  01/27/2018 4:06 PM    [facility] [address] [place], [place], [place]  [address] Phone: [phone]; Fax: [phone]

## 2018-01-30 ENCOUNTER — Other Ambulatory Visit: Payer: Self-pay | Admitting: Family Medicine

## 2018-01-31 ENCOUNTER — Other Ambulatory Visit: Payer: Self-pay | Admitting: Family Medicine

## 2018-01-31 ENCOUNTER — Encounter: Payer: Self-pay | Admitting: Family Medicine

## 2018-01-31 ENCOUNTER — Telehealth: Payer: Self-pay | Admitting: *Deleted

## 2018-01-31 DIAGNOSIS — Z09 Encounter for follow-up examination after completed treatment for conditions other than malignant neoplasm: Secondary | ICD-10-CM

## 2018-01-31 MED ORDER — LEVOTHYROXINE SODIUM 137 MCG PO TABS: 137.0000 ug | ORAL_TABLET | Freq: Every day | ORAL | 0 refills | Status: DC

## 2018-01-31 NOTE — Progress Notes (Signed)
Sorry I missed you paged this morning. Thank you for the update on Miss Jarecki INR.  Lovena NeighboursAbdoulaye Sovereign Ramiro, MD [facility], PGY-2

## 2018-01-31 NOTE — Telephone Encounter (Signed)
Synthroid was refilled. Please inform patient.  Thank you   Lovena NeighboursAbdoulaye Ramir Malerba, MD [facility], PGY-2

## 2018-01-31 NOTE — Progress Notes (Signed)
Home health lab. 01/23/18 INR=1.9

## 2018-02-01 ENCOUNTER — Encounter (HOSPITAL_BASED_OUTPATIENT_CLINIC_OR_DEPARTMENT_OTHER): Payer: Medicare Other | Attending: Physician Assistant

## 2018-02-01 DIAGNOSIS — E1122 Type 2 diabetes mellitus with diabetic chronic kidney disease: Secondary | ICD-10-CM | POA: Insufficient documentation

## 2018-02-01 DIAGNOSIS — L97229 Non-pressure chronic ulcer of left calf with unspecified severity: Secondary | ICD-10-CM | POA: Insufficient documentation

## 2018-02-01 DIAGNOSIS — Z794 Long term (current) use of insulin: Secondary | ICD-10-CM | POA: Diagnosis not present

## 2018-02-01 DIAGNOSIS — E11622 Type 2 diabetes mellitus with other skin ulcer: Secondary | ICD-10-CM | POA: Diagnosis present

## 2018-02-01 DIAGNOSIS — N186 End stage renal disease: Secondary | ICD-10-CM | POA: Diagnosis not present

## 2018-02-01 DIAGNOSIS — I2510 Atherosclerotic heart disease of native coronary artery without angina pectoris: Secondary | ICD-10-CM | POA: Diagnosis not present

## 2018-02-01 DIAGNOSIS — G4730 Sleep apnea, unspecified: Secondary | ICD-10-CM | POA: Diagnosis not present

## 2018-02-01 DIAGNOSIS — E1140 Type 2 diabetes mellitus with diabetic neuropathy, unspecified: Secondary | ICD-10-CM | POA: Insufficient documentation

## 2018-02-01 DIAGNOSIS — Z87891 Personal history of nicotine dependence: Secondary | ICD-10-CM | POA: Insufficient documentation

## 2018-02-01 DIAGNOSIS — L97522 Non-pressure chronic ulcer of other part of left foot with fat layer exposed: Secondary | ICD-10-CM | POA: Diagnosis not present

## 2018-02-01 DIAGNOSIS — L97819 Non-pressure chronic ulcer of other part of right lower leg with unspecified severity: Secondary | ICD-10-CM | POA: Insufficient documentation

## 2018-02-06 ENCOUNTER — Other Ambulatory Visit: Payer: Self-pay

## 2018-02-06 ENCOUNTER — Other Ambulatory Visit: Payer: Self-pay | Admitting: *Deleted

## 2018-02-06 ENCOUNTER — Other Ambulatory Visit: Payer: Self-pay | Admitting: Family Medicine

## 2018-02-06 DIAGNOSIS — I4891 Unspecified atrial fibrillation: Secondary | ICD-10-CM

## 2018-02-06 DIAGNOSIS — Z09 Encounter for follow-up examination after completed treatment for conditions other than malignant neoplasm: Secondary | ICD-10-CM

## 2018-02-06 DIAGNOSIS — I509 Heart failure, unspecified: Secondary | ICD-10-CM

## 2018-02-06 LAB — POCT INR: INR: 1.5

## 2018-02-06 MED ORDER — METOPROLOL SUCCINATE ER 100 MG PO TB24: 100.0000 mg | ORAL_TABLET | Freq: Every day | ORAL | 3 refills | Status: DC

## 2018-02-06 NOTE — Telephone Encounter (Signed)
Patient left message on nurse line requesting refill of Metoprolol. States she is out. Next appt is 02/21/18.

## 2018-02-07 ENCOUNTER — Ambulatory Visit: Payer: Medicare Other | Admitting: Family Medicine

## 2018-02-08 ENCOUNTER — Other Ambulatory Visit: Payer: Self-pay | Admitting: Cardiovascular Disease

## 2018-02-08 DIAGNOSIS — I739 Peripheral vascular disease, unspecified: Secondary | ICD-10-CM

## 2018-02-13 ENCOUNTER — Other Ambulatory Visit: Payer: Self-pay | Admitting: *Deleted

## 2018-02-13 LAB — POCT INR: INR: 1.3

## 2018-02-14 ENCOUNTER — Telehealth: Payer: Self-pay

## 2018-02-14 NOTE — Telephone Encounter (Signed)
Patient left message on nurse line that she needs refill on Humalog. States she has been waiting on this and it may need a PA but no previous message found and Novolog on med list.  Called and spoke with Trinna PostAlex, pharmacist at [facility]. Patient has Humalog rx written by Dr Chanetta Marshallimberlake on 07/30/17 and refilled on 11/30/17 with directions of 4 units 3x/daily. Insurance will not pay for refill until 03/04/18. Have directions changed? Please advise and send new Rx if needed. Pt will be out on Thursday am. Ples SpecterAlisa Raymundo Rout, RN [facility] RN)

## 2018-02-15 DIAGNOSIS — E11622 Type 2 diabetes mellitus with other skin ulcer: Secondary | ICD-10-CM | POA: Diagnosis not present

## 2018-02-15 NOTE — Telephone Encounter (Signed)
Patient informed that a sample was placed up aside for her in the refrigerator until she comes in next week and we can straighten out her scripts.  Patient voiced understanding.  NDC [id number], lot [id number], exp 07/26/2018. Jazmin Hartsell,CMA

## 2018-02-15 NOTE — Telephone Encounter (Signed)
Jazmin,   If we have sample of novolog, can you give her a pen or two until 3/9 when she can get it refilled.  Thanks  Lovena NeighboursAbdoulaye Eulalia Ellerman, MD [facility], PGY-2

## 2018-02-16 ENCOUNTER — Other Ambulatory Visit: Payer: Self-pay | Admitting: Family Medicine

## 2018-02-16 NOTE — Progress Notes (Unsigned)
[place]

## 2018-02-20 ENCOUNTER — Inpatient Hospital Stay (HOSPITAL_COMMUNITY)
Admission: RE | Admit: 2018-02-20 | Payer: Medicare Other | Source: Ambulatory Visit | Attending: Cardiovascular Disease | Admitting: Cardiovascular Disease

## 2018-02-20 ENCOUNTER — Other Ambulatory Visit: Payer: Self-pay | Admitting: *Deleted

## 2018-02-20 ENCOUNTER — Encounter (HOSPITAL_COMMUNITY): Payer: Medicare Other

## 2018-02-20 LAB — POCT INR: INR: 1.7

## 2018-02-21 ENCOUNTER — Encounter: Payer: Self-pay | Admitting: Family Medicine

## 2018-02-21 ENCOUNTER — Other Ambulatory Visit: Payer: Self-pay

## 2018-02-21 ENCOUNTER — Ambulatory Visit (INDEPENDENT_AMBULATORY_CARE_PROVIDER_SITE_OTHER): Payer: Medicare Other | Admitting: Family Medicine

## 2018-02-21 VITALS — BP 126/70 | Temp 97.5°F | Ht 65.0 in | Wt 250.0 lb

## 2018-02-21 DIAGNOSIS — Z794 Long term (current) use of insulin: Secondary | ICD-10-CM

## 2018-02-21 DIAGNOSIS — E1122 Type 2 diabetes mellitus with diabetic chronic kidney disease: Secondary | ICD-10-CM

## 2018-02-21 DIAGNOSIS — R2681 Unsteadiness on feet: Secondary | ICD-10-CM

## 2018-02-21 LAB — POCT GLYCOSYLATED HEMOGLOBIN (HGB A1C): Hemoglobin A1C: 6.6

## 2018-02-21 MED ORDER — INSULIN ASPART 100 UNIT/ML ~~LOC~~ SOLN: 9.0000 [IU] | Freq: Three times a day (TID) | SUBCUTANEOUS | 99 refills | Status: DC

## 2018-02-21 NOTE — Patient Instructions (Addendum)
It was great seeing you today! We have addressed the following issues today  1. Your A1c is 6.6 improved from 6.7. Continue the good work you have been doing. We will continue the regimen you are on. 2. I wrote a script for a walker with a seat.  If we did any lab work today, and the results require attention, either me or my nurse will get in touch with you. If everything is normal, you will get a letter in mail and a message via . If you don't hear from us in two weeks, please give us a call. Otherwise, we look forward to seeing you again at your next visit. If you have any questions or concerns before then, please call the clinic at [phone].  Please bring all your medications to every doctors visit  Sign up for My Chart to have easy access to your labs results, and communication with your Primary care physician. Please ask Front Desk for some assistance.   Please check-out at the front desk before leaving the clinic.    Take Care,   Dr. Sydnee Cabaliallo

## 2018-02-21 NOTE — Progress Notes (Signed)
   Subjective:    Patient ID: Venetia Nightebbie Daleo, female    DOB: July 27, 1951, 67 y.o.   MRN: [id number]   CC: Type 2 diabetes management follow-up  HPI: Patient is a 67 year old female with a complex past medical history who presents today to follow-up on type 2 diabetes management.  Patient last A1c in November 2010 was 6.7 which was a significant improvement from her previous one which was 9.7 back in July 2018.  Patient continues to be on Lantus and NovoLog.  She continued to make dietary changes to improve glycemic control.  Patient is is limited in her mobility but has been doing physical therapy for the past few month significant improvement in walking and ADLs.  Baseline glucometer reading fasting glucose glucose have been hovering around 110-120. Patient denies any polyuria, polydipsia.  Smoking status reviewed   ROS: all other systems were reviewed and are negative other than in the HPI   Past Medical History:  Diagnosis Date  . [name] (coronary artery disease), native coronary artery   . CHF (congestive heart failure) (HCC)   . CKD (chronic kidney disease), stage III (HCC)   . Diabetes mellitus without complication (HCC)   . Hyperlipidemia LDL goal <70   . Hypertension   . Iron (Fe) deficiency anemia   . Thyroid disease     Past Surgical History:  Procedure Laterality Date  . APPLICATION OF A-CELL OF EXTREMITY Left 07/27/2017   Procedure: APPLICATION OF A-CELL;  Surgeon: Peggye Formillingham, Claire S, DO;  Location: [facility] OR;  Service: Plastics;  Laterality: Left;  . I&D EXTREMITY Left 07/24/2017   Procedure: IRRIGATION AND DEBRIDEMENT EXTREMITY;  Surgeon: Sheral ApleyMurphy, Timothy D, MD;  [facility];  Service: Orthopedics;  Laterality: Left;  . I&D EXTREMITY Left 07/27/2017   Procedure: IRRIGATION AND DEBRIDEMENT OF LEFT HAND;  Surgeon: Peggye Formillingham, Claire S, DO;  Location: [facility] OR;  Service: Plastics;  Laterality: Left;    Past medical history, surgical, family, and social history reviewed and updated in  the EMR as appropriate.  Objective:  BP 126/70   Temp (!) 97.5 F (36.4 C) (Oral)   Ht 5\' 5"  (1.651 m)   Wt 250 lb (113.4 kg)   BMI 41.60 kg/m   Vitals and nursing note reviewed  General: NAD, pleasant, able to participate in exam Cardiac: RRR, normal heart sounds, no murmurs. 2+ radial and PT pulses bilaterally Respiratory: CTAB, normal effort, No wheezes, rales or rhonchi Abdomen: soft, nontender, nondistended, no hepatic or splenomegaly, +BS Extremities: Unna boots in place minimal swelling noted bilaterally Skin: warm and dry, no rashes noted Neuro: alert and oriented x4, no focal deficits Psych: Normal affect and mood   Assessment & Plan:   #Type 2 diabetes, well controlled Repeat A1c today is 6.6 stable from last check in November.  Patient seems to have good control on her diabetes and has been adherent to prescribed regimen.  Patient has lost a few pounds in the past few months consistent with dietary changes and lifestyle modifications she has made to improve her glycemic control.  We will continue current regimen.  Congratulated patient on consistency and dedication for better health. --Continue Lantus 12 units daily --Continue NovoLog sliding scale for meals --We will recheck A1c in 3 months  New prescription for rolling walker with seat given to patient.   Lovena NeighboursAbdoulaye Honestee Revard, MD [facility] PGY-2

## 2018-02-23 ENCOUNTER — Ambulatory Visit (HOSPITAL_BASED_OUTPATIENT_CLINIC_OR_DEPARTMENT_OTHER)
Admission: RE | Admit: 2018-02-23 | Discharge: 2018-02-23 | Disposition: A | Discharge status: Home / Self Care | Payer: Medicare Other | Source: Ambulatory Visit | Attending: Cardiovascular Disease | Admitting: Cardiovascular Disease

## 2018-02-23 ENCOUNTER — Ambulatory Visit (HOSPITAL_COMMUNITY)
Admission: RE | Admit: 2018-02-23 | Discharge: 2018-02-23 | Disposition: A | Discharge status: Home / Self Care | Payer: Medicare Other | Source: Ambulatory Visit | Attending: Cardiovascular Disease | Admitting: Cardiovascular Disease

## 2018-02-23 DIAGNOSIS — I739 Peripheral vascular disease, unspecified: Secondary | ICD-10-CM | POA: Diagnosis not present

## 2018-02-27 ENCOUNTER — Other Ambulatory Visit: Payer: Self-pay | Admitting: *Deleted

## 2018-02-27 LAB — POCT INR: INR: 2.2

## 2018-03-01 ENCOUNTER — Encounter (HOSPITAL_BASED_OUTPATIENT_CLINIC_OR_DEPARTMENT_OTHER): Payer: Medicare Other | Attending: Physician Assistant

## 2018-03-01 DIAGNOSIS — I890 Lymphedema, not elsewhere classified: Secondary | ICD-10-CM | POA: Diagnosis not present

## 2018-03-01 DIAGNOSIS — L97829 Non-pressure chronic ulcer of other part of left lower leg with unspecified severity: Secondary | ICD-10-CM | POA: Insufficient documentation

## 2018-03-01 DIAGNOSIS — E1151 Type 2 diabetes mellitus with diabetic peripheral angiopathy without gangrene: Secondary | ICD-10-CM | POA: Insufficient documentation

## 2018-03-01 DIAGNOSIS — N182 Chronic kidney disease, stage 2 (mild): Secondary | ICD-10-CM | POA: Insufficient documentation

## 2018-03-01 DIAGNOSIS — I5020 Unspecified systolic (congestive) heart failure: Secondary | ICD-10-CM | POA: Insufficient documentation

## 2018-03-01 DIAGNOSIS — E1122 Type 2 diabetes mellitus with diabetic chronic kidney disease: Secondary | ICD-10-CM | POA: Diagnosis not present

## 2018-03-01 DIAGNOSIS — E11622 Type 2 diabetes mellitus with other skin ulcer: Secondary | ICD-10-CM | POA: Insufficient documentation

## 2018-03-03 ENCOUNTER — Other Ambulatory Visit: Payer: Self-pay | Admitting: *Deleted

## 2018-03-03 DIAGNOSIS — Z09 Encounter for follow-up examination after completed treatment for conditions other than malignant neoplasm: Secondary | ICD-10-CM

## 2018-03-03 MED ORDER — VITAMIN D3 25 MCG (1000 UNIT) PO TABS: ORAL_TABLET | ORAL | 3 refills | Status: AC

## 2018-03-06 ENCOUNTER — Other Ambulatory Visit: Payer: Self-pay | Admitting: *Deleted

## 2018-03-06 ENCOUNTER — Telehealth: Payer: Self-pay | Admitting: Family Medicine

## 2018-03-06 ENCOUNTER — Other Ambulatory Visit: Payer: Self-pay | Admitting: Family Medicine

## 2018-03-06 DIAGNOSIS — I4891 Unspecified atrial fibrillation: Secondary | ICD-10-CM

## 2018-03-06 LAB — POCT INR: INR: 4.4

## 2018-03-06 MED ORDER — WARFARIN SODIUM 3 MG PO TABS: ORAL_TABLET | ORAL | 0 refills | Status: DC

## 2018-03-06 NOTE — Telephone Encounter (Signed)
[facility]:  pts grandson has bronchitis.  Pt is wheezing in right side.  Her saturation rate was 78 but it is now up to 98.  She is very pale. She doesn't want to go to the hospital.  Please advise

## 2018-03-06 NOTE — Telephone Encounter (Signed)
Will forward to RN team. Jazmin Hartsell,CMA  

## 2018-03-07 ENCOUNTER — Telehealth: Payer: Self-pay

## 2018-03-07 ENCOUNTER — Other Ambulatory Visit: Payer: Self-pay

## 2018-03-07 ENCOUNTER — Ambulatory Visit (INDEPENDENT_AMBULATORY_CARE_PROVIDER_SITE_OTHER): Payer: Medicare Other | Admitting: Family Medicine

## 2018-03-07 ENCOUNTER — Encounter: Payer: Self-pay | Admitting: Family Medicine

## 2018-03-07 VITALS — BP 120/80 | HR 108 | Temp 97.4°F | Wt 251.0 lb

## 2018-03-07 DIAGNOSIS — D509 Iron deficiency anemia, unspecified: Secondary | ICD-10-CM | POA: Diagnosis not present

## 2018-03-07 DIAGNOSIS — R059 Cough, unspecified: Secondary | ICD-10-CM

## 2018-03-07 DIAGNOSIS — R05 Cough: Secondary | ICD-10-CM

## 2018-03-07 LAB — POCT HEMOGLOBIN: Hemoglobin: 10 g/dL — AB (ref 12.2–16.2)

## 2018-03-07 MED ORDER — FERROUS SULFATE 325 (65 FE) MG PO TBEC: 325.0000 mg | DELAYED_RELEASE_TABLET | Freq: Two times a day (BID) | ORAL | 3 refills | Status: DC

## 2018-03-07 MED ORDER — BENZONATATE 200 MG PO CAPS: 200.0000 mg | ORAL_CAPSULE | Freq: Two times a day (BID) | ORAL | 0 refills | Status: DC | PRN

## 2018-03-07 NOTE — Telephone Encounter (Signed)
Pt contacted and informed over VM about baseline hgb level. Call back number given if pt has any questions.

## 2018-03-07 NOTE — Progress Notes (Signed)
Blood count is at her baseline, please let patient know. Thanks!

## 2018-03-07 NOTE — Progress Notes (Signed)
    Subjective:    Patient ID: Sylvia Craig, female    DOB: Dec 01, 1951, 67 y.o.   MRN: [id number]   CC: cough  HPI: patient presents for cough for past 1 week. Cough is productive with clear-yellow mucous. She denies fevers or chills. Appetite is decreased but she is tolerating food and liquids without issue. Her home health nurse yesterday was worried about her cough and felt her lungs sounded "like there was bronchitis there" so she told her to be seen. Her son is sick and went to ED yesterday due to concern for flu, which was negative. She denies CP. She has 2L O2 on, has not required more oxygen. Denies SOB except with severe coughing spells. She has normal bowel and bladder function.   She was told she looks paler than normal yesterday. She needs refill on iron pills. She denies bloody or dark tarry stools.   Smoking status reviewed- former smoker  Review of Systems- see HPI   Objective:  BP 120/80   Pulse (!) 108   Temp (!) 97.4 F (36.3 C) (Oral)   Wt 251 lb (113.9 kg)   SpO2 97% Comment: 2L 02  BMI 41.77 kg/m  Vitals and nursing note reviewed  General: elderly chronically ill appearing lady, [place] in place, in NAD. Pale.  HEENT: normocephalic, TM's visualized bilaterally, no scleral icterus appreciated, conjunctival pallor noted. No nasal discharge, moist mucous membranes, no erythema or discharge noted in posterior oropharynx Neck: supple, non-tender, without lymphadenopathy Cardiac: RRR, clear S1 and S2, no murmurs, rubs, or gallops Respiratory: clear to auscultation bilaterally, no increased work of breathing Extremities: Right leg without edema, chronic skin changes. Left leg wrapped in ace bandage Skin: warm and dry, no rashes noted. Pallor present. Neuro: alert and oriented, no focal deficits   Assessment & Plan:   1. Cough Likely viral URI with cough. Patient is afebrile, appears well hydrated. Lungs are clear to auscultation. No clinical findings concerning for  pneumonia. No increased LE swelling to suggest CHF exacerbation. Discussed with patient treatment options including watchful waiting versus treating for CAP. She is in favor of watchful waiting which I feel is appropriate. She is at her baseline oxygen requirement with 97% O2 saturation. I advised her to call us or return to be seen if she worsens or develops a fever. In this situation I feel it is appropriate to send in a prescription for z-pack over the phone. She is agreeable to this and states she will call if anything changes.   Of note she is also on warfarin for afib, she is currently in afib, INR therapeutic at last check. She did not take metoprolol yet today.  2. Iron deficiency anemia, unspecified iron deficiency anemia type Due to appearing pale will check hemoglobin today. Patient denies bleeding. Refilled iron. - POCT hemoglobin   Return if symptoms worsen or fail to improve.   Dolores PattyAngela Aidin Doane, DO Family Medicine Resident PGY-2

## 2018-03-07 NOTE — Patient Instructions (Signed)
   It was great seeing you today!  Please call us if you develop fevers or worsening shortness of breath. We would be happy to see you again or call in an antibiotic for you. At this time I think this is just related to a virus and will improve on its own.   If you have questions or concerns please do not hesitate to call at [phone].  Dolores PattyAngela Tyquon Near, DO PGY-2, [facility] 03/07/2018 2:26 PM

## 2018-03-07 NOTE — Telephone Encounter (Signed)
-----   Message from Tillman SersAngela C Riccio, DO sent at 03/07/2018  2:50 PM EDT ----- Blood count is at her baseline, please let patient know. Thanks!

## 2018-03-09 ENCOUNTER — Telehealth: Payer: Self-pay

## 2018-03-09 ENCOUNTER — Emergency Department (HOSPITAL_COMMUNITY): Payer: Medicare Other

## 2018-03-09 ENCOUNTER — Inpatient Hospital Stay (HOSPITAL_COMMUNITY)
Admission: EM | Admit: 2018-03-09 | Discharge: 2018-03-17 | DRG: 205 | Disposition: A | Discharge status: Skilled Nursing Facility | Payer: Medicare Other | Attending: Family Medicine | Admitting: Family Medicine

## 2018-03-09 ENCOUNTER — Other Ambulatory Visit: Payer: Self-pay

## 2018-03-09 ENCOUNTER — Encounter (HOSPITAL_COMMUNITY): Payer: Self-pay

## 2018-03-09 DIAGNOSIS — Z87891 Personal history of nicotine dependence: Secondary | ICD-10-CM

## 2018-03-09 DIAGNOSIS — D509 Iron deficiency anemia, unspecified: Secondary | ICD-10-CM | POA: Diagnosis present

## 2018-03-09 DIAGNOSIS — G9349 Other encephalopathy: Secondary | ICD-10-CM | POA: Diagnosis present

## 2018-03-09 DIAGNOSIS — J9622 Acute and chronic respiratory failure with hypercapnia: Secondary | ICD-10-CM | POA: Diagnosis present

## 2018-03-09 DIAGNOSIS — E039 Hypothyroidism, unspecified: Secondary | ICD-10-CM | POA: Diagnosis present

## 2018-03-09 DIAGNOSIS — G4733 Obstructive sleep apnea (adult) (pediatric): Secondary | ICD-10-CM | POA: Diagnosis not present

## 2018-03-09 DIAGNOSIS — Z6841 Body Mass Index (BMI) 40.0 and over, adult: Secondary | ICD-10-CM

## 2018-03-09 DIAGNOSIS — Z794 Long term (current) use of insulin: Secondary | ICD-10-CM | POA: Diagnosis not present

## 2018-03-09 DIAGNOSIS — J9610 Chronic respiratory failure, unspecified whether with hypoxia or hypercapnia: Secondary | ICD-10-CM

## 2018-03-09 DIAGNOSIS — R0602 Shortness of breath: Secondary | ICD-10-CM | POA: Diagnosis present

## 2018-03-09 DIAGNOSIS — L8990 Pressure ulcer of unspecified site, unspecified stage: Secondary | ICD-10-CM

## 2018-03-09 DIAGNOSIS — Z9981 Dependence on supplemental oxygen: Secondary | ICD-10-CM | POA: Diagnosis not present

## 2018-03-09 DIAGNOSIS — Z885 Allergy status to narcotic agent status: Secondary | ICD-10-CM

## 2018-03-09 DIAGNOSIS — I700 Atherosclerosis of aorta: Secondary | ICD-10-CM | POA: Diagnosis present

## 2018-03-09 DIAGNOSIS — J9621 Acute and chronic respiratory failure with hypoxia: Secondary | ICD-10-CM

## 2018-03-09 DIAGNOSIS — Z515 Encounter for palliative care: Secondary | ICD-10-CM

## 2018-03-09 DIAGNOSIS — N183 Chronic kidney disease, stage 3 (moderate): Secondary | ICD-10-CM | POA: Diagnosis present

## 2018-03-09 DIAGNOSIS — E872 Acidosis: Secondary | ICD-10-CM | POA: Diagnosis present

## 2018-03-09 DIAGNOSIS — I130 Hypertensive heart and chronic kidney disease with heart failure and stage 1 through stage 4 chronic kidney disease, or unspecified chronic kidney disease: Secondary | ICD-10-CM | POA: Diagnosis present

## 2018-03-09 DIAGNOSIS — I2510 Atherosclerotic heart disease of native coronary artery without angina pectoris: Secondary | ICD-10-CM | POA: Diagnosis present

## 2018-03-09 DIAGNOSIS — I482 Chronic atrial fibrillation: Secondary | ICD-10-CM | POA: Diagnosis present

## 2018-03-09 DIAGNOSIS — I272 Pulmonary hypertension, unspecified: Secondary | ICD-10-CM | POA: Diagnosis present

## 2018-03-09 DIAGNOSIS — Z7189 Other specified counseling: Secondary | ICD-10-CM

## 2018-03-09 DIAGNOSIS — I5020 Unspecified systolic (congestive) heart failure: Secondary | ICD-10-CM

## 2018-03-09 DIAGNOSIS — E1122 Type 2 diabetes mellitus with diabetic chronic kidney disease: Secondary | ICD-10-CM | POA: Diagnosis present

## 2018-03-09 DIAGNOSIS — Z09 Encounter for follow-up examination after completed treatment for conditions other than malignant neoplasm: Secondary | ICD-10-CM

## 2018-03-09 DIAGNOSIS — Z7901 Long term (current) use of anticoagulants: Secondary | ICD-10-CM

## 2018-03-09 DIAGNOSIS — Z9119 Patient's noncompliance with other medical treatment and regimen: Secondary | ICD-10-CM

## 2018-03-09 DIAGNOSIS — N179 Acute kidney failure, unspecified: Secondary | ICD-10-CM | POA: Diagnosis present

## 2018-03-09 DIAGNOSIS — E662 Morbid (severe) obesity with alveolar hypoventilation: Secondary | ICD-10-CM | POA: Diagnosis present

## 2018-03-09 DIAGNOSIS — Z823 Family history of stroke: Secondary | ICD-10-CM

## 2018-03-09 DIAGNOSIS — Z79890 Hormone replacement therapy (postmenopausal): Secondary | ICD-10-CM

## 2018-03-09 DIAGNOSIS — E875 Hyperkalemia: Secondary | ICD-10-CM | POA: Diagnosis present

## 2018-03-09 DIAGNOSIS — D649 Anemia, unspecified: Secondary | ICD-10-CM | POA: Diagnosis present

## 2018-03-09 DIAGNOSIS — I5023 Acute on chronic systolic (congestive) heart failure: Secondary | ICD-10-CM | POA: Diagnosis present

## 2018-03-09 DIAGNOSIS — R000 Tachycardia, unspecified: Secondary | ICD-10-CM | POA: Diagnosis present

## 2018-03-09 DIAGNOSIS — I878 Other specified disorders of veins: Secondary | ICD-10-CM | POA: Diagnosis present

## 2018-03-09 DIAGNOSIS — E785 Hyperlipidemia, unspecified: Secondary | ICD-10-CM | POA: Diagnosis present

## 2018-03-09 DIAGNOSIS — R748 Abnormal levels of other serum enzymes: Secondary | ICD-10-CM | POA: Diagnosis present

## 2018-03-09 DIAGNOSIS — Z8249 Family history of ischemic heart disease and other diseases of the circulatory system: Secondary | ICD-10-CM

## 2018-03-09 HISTORY — DX: Acute respiratory failure, unspecified whether with hypoxia or hypercapnia: J96.00

## 2018-03-09 LAB — TROPONIN I
Troponin I: 0.48 ng/mL (ref <0.03)
Troponin I: 0.57 ng/mL (ref <0.03)

## 2018-03-09 LAB — CBC
HCT: 37.3 % (ref 36.0–46.0)
Hemoglobin: 10.9 g/dL — ABNORMAL LOW (ref 12.0–15.0)
MCH: 31 pg (ref 26.0–34.0)
MCHC: 29.2 g/dL — ABNORMAL LOW (ref 30.0–36.0)
MCV: 106 fL — ABNORMAL HIGH (ref 78.0–100.0)
PLATELETS: 200 10*3/uL (ref 150–400)
RBC: 3.52 MIL/uL — ABNORMAL LOW (ref 3.87–5.11)
RDW: 15.6 % — ABNORMAL HIGH (ref 11.5–15.5)
WBC: 9.9 10*3/uL (ref 4.0–10.5)

## 2018-03-09 LAB — I-STAT ARTERIAL BLOOD GAS, ED
ACID-BASE EXCESS: 9 mmol/L — AB (ref 0.0–2.0)
Acid-Base Excess: 7 mmol/L — ABNORMAL HIGH (ref 0.0–2.0)
BICARBONATE: 38.3 mmol/L — AB (ref 20.0–28.0)
Bicarbonate: 37.6 mmol/L — ABNORMAL HIGH (ref 20.0–28.0)
O2 Saturation: 99 %
O2 Saturation: 93 %
PCO2 ART: 82.5 mmHg — AB (ref 32.0–48.0)
PH ART: 7.263 — AB (ref 7.350–7.450)
PH ART: 7.315 — AB (ref 7.350–7.450)
PO2 ART: 75 mmHg — AB (ref 83.0–108.0)
Patient temperature: 97.6
TCO2: 40 mmol/L — ABNORMAL HIGH (ref 22–32)
TCO2: 41 mmol/L — ABNORMAL HIGH (ref 22–32)
pCO2 arterial: 75.2 mmHg (ref 32.0–48.0)
pO2, Arterial: 148 mmHg — ABNORMAL HIGH (ref 83.0–108.0)

## 2018-03-09 LAB — GLUCOSE, CAPILLARY: Glucose-Capillary: 147 mg/dL — ABNORMAL HIGH (ref 65–99)

## 2018-03-09 LAB — BASIC METABOLIC PANEL
Anion gap: 12 (ref 5–15)
BUN: 59 mg/dL — AB (ref 6–20)
CO2: 31 mmol/L (ref 22–32)
CREATININE: 2.25 mg/dL — AB (ref 0.44–1.00)
Calcium: 9.6 mg/dL (ref 8.9–10.3)
Chloride: 99 mmol/L — ABNORMAL LOW (ref 101–111)
GFR calc Af Amer: 25 mL/min — ABNORMAL LOW (ref >60)
GFR, EST NON AFRICAN AMERICAN: 21 mL/min — AB (ref >60)
Glucose, Bld: 178 mg/dL — ABNORMAL HIGH (ref 65–99)
POTASSIUM: 5.3 mmol/L — AB (ref 3.5–5.1)
SODIUM: 142 mmol/L (ref 135–145)

## 2018-03-09 LAB — I-STAT TROPONIN, ED: Troponin i, poc: 0.39 ng/mL (ref 0.00–0.08)

## 2018-03-09 LAB — INFLUENZA PANEL BY PCR (TYPE A & B)
Influenza A By PCR: NEGATIVE
Influenza B By PCR: NEGATIVE

## 2018-03-09 LAB — BRAIN NATRIURETIC PEPTIDE: B Natriuretic Peptide: 768.6 pg/mL — ABNORMAL HIGH (ref 0.0–100.0)

## 2018-03-09 LAB — PROTIME-INR
INR: 8.28
Prothrombin Time: 68.3 seconds — ABNORMAL HIGH (ref 11.4–15.2)

## 2018-03-09 MED ORDER — METOPROLOL SUCCINATE ER 100 MG PO TB24
100.0000 mg | ORAL_TABLET | Freq: Every day | ORAL | Status: DC
  Administered 2018-03-10 – 2018-03-17 (×8): 100 mg via ORAL
  Filled 2018-03-09 (×8): qty 1

## 2018-03-09 MED ORDER — ACETAMINOPHEN 325 MG PO TABS
650.0000 mg | ORAL_TABLET | Freq: Four times a day (QID) | ORAL | Status: DC | PRN
  Administered 2018-03-10 – 2018-03-17 (×6): 650 mg via ORAL
  Filled 2018-03-09 (×6): qty 2

## 2018-03-09 MED ORDER — ROSUVASTATIN CALCIUM 10 MG PO TABS
10.0000 mg | ORAL_TABLET | Freq: Every day | ORAL | Status: DC
  Administered 2018-03-10 – 2018-03-16 (×4): 10 mg via ORAL
  Filled 2018-03-09 (×4): qty 1

## 2018-03-09 MED ORDER — ACETAMINOPHEN 650 MG RE SUPP: 650.0000 mg | Freq: Four times a day (QID) | RECTAL | Status: DC | PRN

## 2018-03-09 MED ORDER — INSULIN ASPART 100 UNIT/ML ~~LOC~~ SOLN
0.0000 [IU] | Freq: Three times a day (TID) | SUBCUTANEOUS | Status: DC
  Administered 2018-03-10 – 2018-03-13 (×6): 2 [IU] via SUBCUTANEOUS
  Administered 2018-03-13: 3 [IU] via SUBCUTANEOUS
  Administered 2018-03-14: 1 [IU] via SUBCUTANEOUS
  Administered 2018-03-14: 2 [IU] via SUBCUTANEOUS
  Administered 2018-03-14 – 2018-03-15 (×2): 1 [IU] via SUBCUTANEOUS
  Administered 2018-03-15: 3 [IU] via SUBCUTANEOUS
  Administered 2018-03-15: 2 [IU] via SUBCUTANEOUS
  Administered 2018-03-16 – 2018-03-17 (×3): 3 [IU] via SUBCUTANEOUS

## 2018-03-09 MED ORDER — PHYTONADIONE 5 MG PO TABS
2.5000 mg | ORAL_TABLET | Freq: Once | ORAL | Status: DC
  Filled 2018-03-09 (×2): qty 1

## 2018-03-09 MED ORDER — POLYETHYLENE GLYCOL 3350 17 G PO PACK
17.0000 g | PACK | Freq: Every day | ORAL | Status: DC
  Administered 2018-03-10 – 2018-03-17 (×5): 17 g via ORAL
  Filled 2018-03-09 (×8): qty 1

## 2018-03-09 MED ORDER — VITAMIN K1 10 MG/ML IJ SOLN
2.5000 mg | Freq: Once | INTRAVENOUS | Status: DC
  Filled 2018-03-09 (×2): qty 0.25

## 2018-03-09 MED ORDER — LEVOTHYROXINE SODIUM 137 MCG PO TABS
137.0000 ug | ORAL_TABLET | Freq: Every day | ORAL | Status: DC
  Filled 2018-03-09: qty 1

## 2018-03-09 MED ORDER — INSULIN GLARGINE 100 UNIT/ML ~~LOC~~ SOLN
6.0000 [IU] | Freq: Every day | SUBCUTANEOUS | Status: DC
  Administered 2018-03-09 – 2018-03-16 (×8): 6 [IU] via SUBCUTANEOUS
  Filled 2018-03-09 (×9): qty 0.06

## 2018-03-09 MED ORDER — FUROSEMIDE 10 MG/ML IJ SOLN
40.0000 mg | Freq: Once | INTRAMUSCULAR | Status: AC
  Administered 2018-03-09: 40 mg via INTRAVENOUS
  Filled 2018-03-09: qty 4

## 2018-03-09 NOTE — Progress Notes (Signed)
I saw and examined [name].  Discussed with Drs. Amin and [place].  Agreed on a plan.  I will co sign their H&PE when available.  Briefly, Ms Sylvia Craig comes in with worsening shortness of breath.  We are still very much in the information gathering stage.  What I know so far is: 1. Worsening Shortness of breath.  Etiology is uncertain.  Per problem list has chronic resp failure on home O2 (but no dx of COPD).  Also has dx of HFrER.  I suspect she has Acute on chronic systolic CHF.  I am less clear about her chronic resp failure.  No evidence of acute infection.  Her body habitus suggests she may have obesity hypovent syndrome.  She needs ABGs to help us define if she is CO2 retaining.  ABGs and diuresis. 2. Mentally slow - Again, I worry about CO2 narcosis. 3. AKI but exam shows peripheral edema.  Suspect AKI is due to CHF.  As such we will start with gentle diuresis. 4. Morbid obesity. 5. A fib is chronic.   6. Mild increase in Trop but better than last admit.  Doubt acute cardiac event. 7. Insulin requiring DM.

## 2018-03-09 NOTE — ED Provider Notes (Addendum)
[facility] EMERGENCY DEPARTMENT Provider Note   CSN: [id number] Arrival date & time: 03/09/18  1216     History   Chief Complaint Chief Complaint  Patient presents with  . Shortness of Breath    HPI Sylvia Craig is a 67 y.o. female.  Patient followed by family medicine.  Recently seen in the clinic for productive cough felt to be bronchitis.  Patient presents today with worsening cough and shortness of breath and generalized weakness.  Patient states she has had a cough for 7 days.  No fevers.  Patient feels that her oxygen is falling out while she is sleeping at home and she is getting very short of breath.  Patient is to be on 2-3 L of nasal cannula oxygen at all times.  Patient has a history of atrial fibrillation and is on Coumadin.  Patient's had trouble with market leg swelling has been on increased amounts of Lasix to get the fluid off.  That is improving.  Does have some weeping wounds to the left lower extremity that are being followed at the wound clinic.  Patient denies any chest pain.      Past Medical History:  Diagnosis Date  . [name] (coronary artery disease), native coronary artery   . CHF (congestive heart failure) (HCC)   . CKD (chronic kidney disease), stage III (HCC)   . Diabetes mellitus without complication (HCC)   . Hyperlipidemia LDL goal <70   . Hypertension   . Iron (Fe) deficiency anemia   . Thyroid disease     Patient Active Problem List   Diagnosis Date Noted  . Venous stasis ulcers of both lower extremities (HCC) 12/30/2017  . Edema 10/09/2017  . CKD (chronic kidney disease) stage 3, GFR 30-59 ml/min (HCC) 10/09/2017  . Type 2 diabetes mellitus (HCC) 10/07/2017  . Abnormal ankle brachial index (ABI) 10/06/2017  . Chronic respiratory failure (HCC) 10/06/2017  . Chronic atrial fibrillation (HCC) 10/04/2017  . Coronary artery disease involving native coronary artery of native heart with angina pectoris (HCC) 08/25/2017  . UTI  (urinary tract infection) 08/25/2017  . Multiple open wounds of lower leg 08/25/2017  . Unsteady gait 08/11/2017  . Obstructive sleep apnea   . Necrotic wound of left hand (HCC)   . Morbid obesity (HCC) 07/26/2017  . (HFpEF) heart failure with preserved ejection fraction (HCC) 01/16/2016  . Anemia, iron deficiency 11/16/2014    Past Surgical History:  Procedure Laterality Date  . APPLICATION OF A-CELL OF EXTREMITY Left 07/27/2017   Procedure: APPLICATION OF A-CELL;  Surgeon: Peggye Form, DO;  Location: [facility] OR;  Service: Plastics;  Laterality: Left;  . I&D EXTREMITY Left 07/24/2017   Procedure: IRRIGATION AND DEBRIDEMENT EXTREMITY;  Surgeon: Sheral Apley, MD;  Location[facility] OR;  Service: Orthopedics;  Laterality: Left;  . I&D EXTREMITY Left 07/27/2017   Procedure: IRRIGATION AND DEBRIDEMENT OF LEFT HAND;  Surgeon: Peggye Form, DO;  Location: [facility] OR;  Service: Plastics;  Laterality: Left;    OB History    No data available       Home Medications    Prior to Admission medications   Medication Sig Start Date End Date Taking? Authorizing Provider  acetaminophen (TYLENOL) 500 MG tablet Take 1,000 mg by mouth every 6 (six) hours as needed for moderate pain or headache.     [provider]  benzonatate (TESSALON) 200 MG capsule Take 1 capsule (200 mg total) by mouth 2 (two) times daily as needed  for cough. 03/07/18   Tillman Sersiccio, Angela C, DO  cholecalciferol (VITAMIN D) 1000 units tablet TAKE 1 TABLET (1,000 UNITS TOTAL) BY MOUTH AT BEDTIME. 03/03/18   Diallo, [name], MD  ferrous sulfate 325 (65 FE) MG EC tablet Take 1 tablet (325 mg total) by mouth 2 (two) times daily. 03/07/18   Tillman Sersiccio, Angela C, DO  furosemide (LASIX) 40 MG tablet Take 1 tablet (40 mg total) by mouth daily. May take additional 20mg  for leg swelling or shortness of breath. 10/11/17 01/09/18  Bhagat, Sharrell KuBhavinkumar, PA  insulin aspart (NOVOLOG) 100 UNIT/ML injection Inject 0-9 Units into the skin 3 (three)  times daily with meals. 09/05/17   [name], [place], DO  insulin aspart (NOVOLOG) 100 UNIT/ML injection Inject 9 Units into the skin 3 (three) times daily before meals. 02/21/18   Diallo, Lilia ArgueAbdoulaye, MD  insulin glargine (LANTUS) 100 UNIT/ML injection Inject 0.12 mLs (12 Units total) into the skin at bedtime. 10/07/17   Beaulah DinningGambino, Christina M, MD  levothyroxine (SYNTHROID, LEVOTHROID) 137 MCG tablet Take 1 tablet (137 mcg total) by mouth daily before breakfast. 01/31/18   Diallo, Lilia ArgueAbdoulaye, MD  metoprolol succinate (TOPROL-XL) 100 MG 24 hr tablet Take 1 tablet (100 mg total) by mouth daily. 02/06/18   Diallo, Lilia ArgueAbdoulaye, MD  Multiple Vitamin (MULTIVITAMIN WITH MINERALS) TABS tablet Take 1 tablet by mouth daily. 10/07/17   Beaulah DinningGambino, Christina M, MD  nitroGLYCERIN (NITROSTAT) 0.4 MG SL tablet Take 0.4 mg under the toungue as needed for chest pain 10/07/17   Beaulah DinningGambino, Christina M, MD  rosuvastatin (CRESTOR) 10 MG tablet Take 1 tablet (10 mg total) by mouth at bedtime. 10/07/17   Beaulah DinningGambino, Christina M, MD  senna-docusate (SENOKOT-S) 8.6-50 MG tablet Take 1 tablet by mouth at bedtime as needed for mild constipation. 09/05/17   [name], [place], DO  vitamin C (ASCORBIC ACID) 500 MG tablet Take 2 tablets (1,000 mg total) by mouth at bedtime. 10/27/17   Diallo, Lilia ArgueAbdoulaye, MD  warfarin (COUMADIN) 3 MG tablet 3 mg daily 03/06/18   Diallo, Lilia ArgueAbdoulaye, MD  zinc sulfate 220 (50 Zn) MG capsule Take 1 capsule (220 mg total) by mouth daily. 10/25/17   Diallo, Lilia ArgueAbdoulaye, MD  zinc sulfate 220 (50 Zn) MG capsule TAKE ONE CAPSULE BY MOUTH EVERY DAY 03/06/18   Lovena Neighboursiallo, [name], MD    Family History Family History  Problem Relation Age of Onset  . Dementia Mother   . [name] Mother   . Heart disease Mother   . Hypertension Mother   . Stroke Mother   . Cancer Father   . Sudden Cardiac Death Neg Hx     Social History Social History   Tobacco Use  . Smoking status: Former Smoker    Packs/day: 1.00    Types: Cigarettes    Last  attempt to quit: 02/24/1988    Years since quitting: 30.0  . Smokeless tobacco: Never Used  Substance Use Topics  . Alcohol use: No  . Drug use: No     Allergies   Codeine; Glucosamine; Oxycodone; Shellfish allergy; Shellfish-derived products; Atorvastatin; Lisinopril; Other; and Oxycodone-aspirin   Review of Systems Review of Systems  Constitutional: Negative for fever.  HENT: Negative for congestion.   Eyes: Negative for redness and visual disturbance.  Respiratory: Positive for cough.   Cardiovascular: Positive for leg swelling. Negative for chest pain.  Gastrointestinal: Negative for abdominal pain.  Genitourinary: Negative for dysuria.  Musculoskeletal: Negative for back pain.  Skin: Positive for wound.  Neurological: Negative for headaches.  Hematological: Bruises/bleeds easily.  Psychiatric/Behavioral: Negative for confusion.     Physical Exam Updated Vital Signs BP 130/75   Pulse 97   Temp 97.6 F (36.4 C) (Oral)   Resp (!) 22   Ht 1.651 m (5\' 5" )   Wt 113.9 kg (251 lb)   SpO2 100%   BMI 41.77 kg/m   Physical Exam  Constitutional: She is oriented to person, place, and time. She appears well-developed and well-nourished. No distress.  HENT:  Head: Normocephalic and atraumatic.  Mucous membranes dry.  Eyes: EOM are normal. Pupils are equal, round, and reactive to light.  Neck: Neck supple.  Cardiovascular: Normal rate.  Irregular  Pulmonary/Chest: Effort normal and breath sounds normal. No respiratory distress. She has no rales.  Abdominal: Soft. Bowel sounds are normal. There is no tenderness.  Musculoskeletal: Normal range of motion. She exhibits edema.  Bilateral lower extremity redness and some edema.  Weeping wounds wrapped on the left lower leg.  Feel the erythema is secondary to the chronic swelling.  Not cellulitis.  Neurological: She is alert and oriented to person, place, and time. No cranial nerve deficit or sensory deficit. She exhibits normal  muscle tone. Coordination normal.  Skin: Skin is warm.  Nursing note and vitals reviewed.    ED Treatments / Results  Labs (all labs ordered are listed, but only abnormal results are displayed) Labs Reviewed  BASIC METABOLIC PANEL - Abnormal; Notable for the following components:      Result Value   Potassium 5.3 (*)    Chloride 99 (*)    Glucose, Bld 178 (*)    BUN 59 (*)    Creatinine, Ser 2.25 (*)    GFR calc non Af Amer 21 (*)    GFR calc Af Amer 25 (*)    All other components within normal limits  CBC - Abnormal; Notable for the following components:   RBC 3.52 (*)    Hemoglobin 10.9 (*)    MCV 106.0 (*)    MCHC 29.2 (*)    RDW 15.6 (*)    All other components within normal limits  PROTIME-INR - Abnormal; Notable for the following components:   Prothrombin Time 68.3 (*)    INR 8.28 (*)    All other components within normal limits  I-STAT TROPONIN, ED - Abnormal; Notable for the following components:   Troponin i, poc 0.39 (*)    All other components within normal limits  BRAIN NATRIURETIC PEPTIDE  TROPONIN I    EKG  EKG Interpretation  Date/Time:  Thursday March 09 2018 12:28:22 EDT Ventricular Rate:  97 PR Interval:    QRS Duration: 122 QT Interval:  320 QTC Calculation: 407 R Axis:   -8 Text Interpretation:  Sinus tachycardia Atrial fibrillation Prolonged PR interval Nonspecific intraventricular conduction delay Borderline repolarization abnormality Confirmed by Vanetta Mulders [phone]) on 03/09/2018 1:02:37 PM       Radiology Dg Chest 2 View  Result Date: 03/09/2018 CLINICAL DATA:  Shortness of breath and chest pain EXAM: CHEST - 2 VIEW COMPARISON:  August 31, 2017 FINDINGS: There is no edema or consolidation. Heart is mildly enlarged with pulmonary vascularity within normal limits. There is aortic atherosclerosis. No adenopathy. There is an old healed fracture of the left clavicle. IMPRESSION: Cardiomegaly.  Aortic atherosclerosis.  No edema or  consolidation. Aortic Atherosclerosis (ICD10-I70.0). Electronically Signed   By: Bretta Bang III M.D.   On: 03/09/2018 13:30    Procedures Procedures (including critical care time)  Medications Ordered in ED Medications -  No data to display   Initial Impression / Assessment and Plan / ED Course  I have reviewed the triage vital signs and the nursing notes.  Pertinent labs & imaging results that were available during my care of the patient were reviewed by me and considered in my medical decision making (see chart for details).    Patient followed by family medicine.  Patient came in for increasing shortness of breath patient's been on increased Lasix to try to get fluid off of her lungs.  Recently seen by family practice clinic for the shortness of breath and cough that was productive and diagnosed with bronchitis.  Patient may very well have bronchitis.  Patient normally on 2-3 L of nasal cannula oxygen.  Patient states that night she is feeling short of breath concerned her oxygen is coming off.  Chest x-ray here without evidence of pneumonia.  Labs show evidence of an acute kidney injury with elevated BUN and creatinine and potassium is up to 5.3.  EKG without changes consistent with acute hyperkalemia at this time.  In addition patient's troponin was elevated.  When patient was admitted back in August she had an elevated troponin at that time.  Feel it may be secondary to the kidney.  Patient without any history of chest pain recently.  In addition patient is on Coumadin for atrial fibrillation.  INR is markedly elevated at 8.28.  Patient currently without any active bleeding.  We will discuss with family medicine patient will require readmission.   Final Clinical Impressions(s) / ED Diagnoses   Final diagnoses:  SOB (shortness of breath)  AKI (acute kidney injury) [facility])    ED Discharge Orders    None       Vanetta Mulders, MD 03/09/18 1512    Vanetta Mulders,  MD 03/09/18 1513

## 2018-03-09 NOTE — ED Notes (Signed)
SN attempted to call report to floor.

## 2018-03-09 NOTE — Progress Notes (Signed)
RBV to Dr. Jodelle GrossZakowski @ 2015 on 03/09/2018. No changes to be made at this time.

## 2018-03-09 NOTE — ED Triage Notes (Signed)
Pt. Arrived from home with c/o worsening cough and SOB with generalized weakness. Pt states she has had a cough for 7 days and been dx with viral bronchitis, not given any medications. Pt reports she thinks her O2 is falling out while sleeping, EMS reports pt dropping into 80's on [address]L [place] with movement from chair to stretcher. Pt has wounds on legs with weeping edema, being treated at wound clinic.

## 2018-03-09 NOTE — ED Notes (Signed)
Attempted to stick Pt X2, unable to get obtain blood.

## 2018-03-09 NOTE — H&P (Addendum)
Family Medicine [facility] Admission History and Physical Service Pager: [phone]  Patient name: Sylvia Craig Medical record number: [id number] Date of birth: Sep 05, 1951 Age: 67 y.o. Gender: female  Primary Care Provider: [name], MD Consultants: None Code Status: Full  Chief Complaint: SOB  Assessment and Plan: Sylvia Craig is a 67 y.o. female presenting with shortness of breath. PMH is significant for HTN, hypothyroidism, HFmrEF, HLD, [name], insulin-dependent diabetes mellitus, and atrial fibrillation on coumadin.  SOB: Patient with 3-4 days of increasing shortness of breath. Has home oxygen normally on 2-3  L, but has been requiring 3 L over the last few days. She reports productive cough. Patient denies any history of COPD or asthma, has no inhalers at home. Reports some increased swelling in her legs for the past 2 days. Patient with tachycardia and tachypnea on exam.   Patient afebrile without white count.  Do not suspect DVT with lower leg swelling given it is bilateral and without calf tenderness.  Low suspicion for PE with low Well's score of 1.5.  Liikely CHF exacerbation given BNP elevated to 768. Did not take her Lasix this morning, is on 40mg  daily, but did take some extra for a few days to help. CXR with cardiomegaly and no active disease or edema. No evidence of pneumonia on imaging.   On physical exam with 2+ pitting edema on exam and no crackles. Could also be a component of obesity hypoventilation syndrome. Wheezing noted on exam. Patient does have history of pulmonary htn as well. Patient was lethargic and slow to answer questions on exam, however did answer appropriately. Will obtain ABG, although patient also satting 100% on home O2 of 3L.  Influenza ordered in ED and is pending.  - Admit to stepdown, attending Dr. Leveda Craig -continuous cardiac monitoring - Obtain ABG - Influenza pending - IV Lasix x1 , will re-evaluate fluid status later this evening   - Monitor respiratory status -supplemental O2 PRN  - continuous pulse ox - vitals per unit  Acute on Chronic HFmrEF: Appears hypervolemic on admission.Last ECHO 08/2017 with EF of 45-50%. Baseline weight appears to be ~250lb during recent office visits, 251 lb today.   At home on Metoprolol XL 100 mg daily.  -Plan to continue beta-blocker tomorrow as she took her dose today - Daily wts and strict I/O's - Lasix 40 meq IV x1; monitor fluid status and give additional diuresis as clinically indicated  AKI on CKD, III: SCr is 2.25 on admission, up from 1.6 in October. Possibly cardiorenal given fluid overload and history of HfmrEF.   - Repeat chem panel in am  - avoid nephrotoxic agents  Elevated troponin [name]: Initial troponin elevated to 0.36>0.48. There is evidence for [name] with aortic atherosclerosis on CXR. She denies any chest pain or tightness. Previous admission with troponin elevated to 1.22. - Consider cardiology consult - Continue cardiac monitoring - trend troponins x3 - AM EKG   Atrial fibrillation with RVR: Rate in 90's while in the room without any medications. Supra-therapeutic INR of 8.2.   - CHADS-VASc is 41 (age, gender, CHF, DM, [name]) - Managed with coudmadin and Toprol at home  - Will hold off on anticoagulation given supra-therapeutic INR - Continue Toprol as tolerated   Pulmonary HTN: PA peak pressure: 62 mm Hg on ECHO 08/2017.  - Has supplemental oxygen at home, baseline 2-3L  Hypothyroidism.  Do not see recent TSH on Care Everywhere apart from 2011 when it was well controlled.  At home  on Synthroid 137 mcg.   -Continue home Synthroid -will repeat TSH   Insulin-dependent DM: Recent A1c 6.6 on 02/21/2018  - Managed at home with Humalog 4U TID  - Monitor CBG's - sSSI  Hyperkalemia: Serum potassium 5.3 on admission - No EKG changes noted - BMP in the AM   Supra-therapeutic INR of 8.2: On coumadin at home. Hgb 10.9. No hematochezia noted and patient  denies any active bleeding.  - Discussed with pharmacy, will give Vitamin K 2.5mg  x1  - Monitor for signs of bleeding  Leg wounds: Bilateral LE's with stigmata of chronic venous stasis  - There are some wounds bilaterally with serous weeping and surrounding erythema  - Wound care consulted for recs  Anemia: Hgb 10.9 on admission (BL 10-11). At home on daily ferrous sulfate 325 mg.  In setting of CHF, transfusion threshold would be 8.0.   -monitor with daily CBC   FEN/GI: Heart healthy/ Carb modified Prophylaxis: holding due to supra-therapeutic INR (goal 2-3)   Disposition: admit to stepdown, attending Dr. Leveda Craig  History of Present Illness:  Sylvia Craig is a 67 y.o. female presenting with shortness of breath x 4-5 days. Patient has home O2 at 2-3 L, but bumped O2 up to 3L for the past several days. Patient reports no SOB currently; denies CP, palpitations, lightheadedness or dizziness. She has noticed some worsening leg swelling which started within last 1-2 days. She has a home health nurse taking care of her and she stays with her son who helps take care of her. She has had some difficulty laying flat and has been sleeping on a medical chair that reclines for some time now. Patient's cough started Thursday, coughing up yellow sputum. No fevers or chills, no headache or blurry vision. She endorses weakness in legs which began Friday. She has not been drinking as much fluids bc feeling too tired to drink   Patient states that this morning only took her insulin, metoprolol and synthroid. She did not take lasix. She has prior h/o tobacco use, quit about 27 years ago. She does not have an inhaler at home, no diagnosis of asthma or COPD.  In the ED, patient found to have CXR with no pneumonia and a Cr elevated to 2.25.   Review Of Systems: Per HPI with the following additions:   Review of Systems  Constitutional: Negative for chills and fever.  HENT: Negative for congestion.   Eyes: Negative  for blurred vision.  Respiratory: Positive for cough and sputum production.   Cardiovascular: Positive for orthopnea and leg swelling. Negative for chest pain and palpitations.  Gastrointestinal: Positive for nausea. Negative for abdominal pain, constipation, diarrhea and vomiting.  Genitourinary: Negative for dysuria and urgency.  Skin: Negative for rash.  Neurological: Positive for weakness. Negative for dizziness and headaches.    Patient Active Problem List   Diagnosis Date Noted  . AKI (acute kidney injury) (HCC) 03/09/2018  . Shortness of breath 03/09/2018  . Systolic CHF, acute on chronic (HCC)   . Venous stasis ulcers of both lower extremities (HCC) 12/30/2017  . Edema 10/09/2017  . CKD (chronic kidney disease) stage 3, GFR 30-59 ml/min (HCC) 10/09/2017  . Type 2 diabetes mellitus (HCC) 10/07/2017  . Abnormal ankle brachial index (ABI) 10/06/2017  . Chronic respiratory failure (HCC) 10/06/2017  . Chronic atrial fibrillation (HCC) 10/04/2017  . Coronary artery disease involving native coronary artery of native heart with angina pectoris (HCC) 08/25/2017  . UTI (urinary tract infection) 08/25/2017  .  Multiple open wounds of lower leg 08/25/2017  . Unsteady gait 08/11/2017  . Obstructive sleep apnea   . Necrotic wound of left hand (HCC)   . SOB (shortness of breath)   . Morbid obesity (HCC) 07/26/2017  . (HFpEF) heart failure with preserved ejection fraction (HCC) 01/16/2016  . Anemia, iron deficiency 11/16/2014    Past Medical History: Past Medical History:  Diagnosis Date  . [name] (coronary artery disease), native coronary artery   . CHF (congestive heart failure) (HCC)   . CKD (chronic kidney disease), stage III (HCC)   . Diabetes mellitus without complication (HCC)   . Hyperlipidemia LDL goal <70   . Hypertension   . Iron (Fe) deficiency anemia   . Thyroid disease     Past Surgical History: Past Surgical History:  Procedure Laterality Date  . APPLICATION OF  A-CELL OF EXTREMITY Left 07/27/2017   Procedure: APPLICATION OF A-CELL;  Surgeon: Peggye Formillingham, Claire S, DO;  Location: [facility] OR;  Service: Plastics;  Laterality: Left;  . I&D EXTREMITY Left 07/24/2017   Procedure: IRRIGATION AND DEBRIDEMENT EXTREMITY;  Surgeon: Sheral ApleyMurphy, Timothy D, MD;  [facility];  Service: Orthopedics;  Laterality: Left;  . I&D EXTREMITY Left 07/27/2017   Procedure: IRRIGATION AND DEBRIDEMENT OF LEFT HAND;  Surgeon: Peggye Formillingham, Claire S, DO;  Location: [facility] OR;  Service: Plastics;  Laterality: Left;    Social History: Social History   Tobacco Use  . Smoking status: Former Smoker    Packs/day: 1.00    Types: Cigarettes    Last attempt to quit: 02/24/1988    Years since quitting: 30.0  . Smokeless tobacco: Never Used  Substance Use Topics  . Alcohol use: No  . Drug use: No   Additional social history: prior tobacco use, quit 27 years ago.  No alcohol or drug use.  Lives at home with her son and has a home Geneticist, [employer] nurse.  Please also refer to relevant sections of EMR.  Family History: Family History  Problem Relation Age of Onset  . Dementia Mother   . [name] Mother   . Heart disease Mother   . Hypertension Mother   . Stroke Mother   . Cancer Father   . Sudden Cardiac Death Neg Hx    Allergies and Medications: Allergies  Allergen Reactions  . Codeine Rash and Other (See Comments)    Edema, also  . Glucosamine Shortness Of Breath  . Other Shortness Of Breath and Other (See Comments)    Crab = chest tightness, also  . Oxycodone Rash and Other (See Comments)    Bad dreams, also  . Shellfish Allergy Shortness Of Breath  . Shellfish-Derived Products Shortness Of Breath  . Atorvastatin Other (See Comments)    Muscle aches  . Broccoli [Brassica Oleracea Italica] Other (See Comments)    Very limited intake due to being on Coumadin  . Lisinopril Other (See Comments) and Cough    "Made me hurt all over," also  . Oxycodone-Aspirin Rash and Other (See Comments)    Bad  dreams, also   No current facility-administered medications on file prior to encounter.    Current Outpatient Medications on File Prior to Encounter  Medication Sig Dispense Refill  . acetaminophen (TYLENOL) 500 MG tablet Take 1,000 mg by mouth every 6 (six) hours as needed for moderate pain or headache.     . benzonatate (TESSALON) 200 MG capsule Take 1 capsule (200 mg total) by mouth 2 (two) times daily as needed for cough. 20 capsule  0  . cholecalciferol (VITAMIN D) 1000 units tablet TAKE 1 TABLET (1,000 UNITS TOTAL) BY MOUTH AT BEDTIME. 90 tablet 3  . ferrous sulfate 325 (65 FE) MG EC tablet Take 1 tablet (325 mg total) by mouth 2 (two) times daily. 60 tablet 3  . furosemide (LASIX) 40 MG tablet Take 1 tablet (40 mg total) by mouth daily. May take additional 20mg  for leg swelling or shortness of breath. 120 tablet 3  . insulin aspart (NOVOLOG) 100 UNIT/ML injection Inject 9 Units into the skin 3 (three) times daily before meals. (Patient taking differently: Inject 4-6 Units into the skin 3 (three) times daily before meals. Inject 4-6 units into the skin 3 times a day after meals, per sliding scale) 3 vial PRN  . insulin glargine (LANTUS) 100 UNIT/ML injection Inject 0.12 mLs (12 Units total) into the skin at bedtime. 10 mL 11  . levothyroxine (SYNTHROID, LEVOTHROID) 137 MCG tablet Take 1 tablet (137 mcg total) by mouth daily before breakfast. 90 tablet 0  . metoprolol succinate (TOPROL-XL) 100 MG 24 hr tablet Take 1 tablet (100 mg total) by mouth daily. 30 tablet 3  . Multiple Vitamin (MULTIVITAMIN WITH MINERALS) TABS tablet Take 1 tablet by mouth daily. 30 tablet 3  . nitroGLYCERIN (NITROSTAT) 0.4 MG SL tablet Take 0.4 mg under the toungue as needed for chest pain (Patient taking differently: Place 0.4 mg under the tongue every 5 (five) minutes as needed for chest pain. ) 30 tablet 1  . rosuvastatin (CRESTOR) 10 MG tablet Take 1 tablet (10 mg total) by mouth at bedtime. 90 tablet 0  . vitamin  C (ASCORBIC ACID) 500 MG tablet Take 2 tablets (1,000 mg total) by mouth at bedtime. 90 tablet 1  . warfarin (COUMADIN) 3 MG tablet 3 mg daily (Patient taking differently: Take 3 mg by mouth at bedtime. ) 90 tablet 0  . zinc sulfate 220 (50 Zn) MG capsule TAKE ONE CAPSULE BY MOUTH EVERY DAY (Patient taking differently: Take 220 mg by mouth once a day) 30 capsule 3  . insulin aspart (NOVOLOG) 100 UNIT/ML injection Inject 0-9 Units into the skin 3 (three) times daily with meals. (Patient not taking: Reported on 03/09/2018)    . senna-docusate (SENOKOT-S) 8.6-50 MG tablet Take 1 tablet by mouth at bedtime as needed for mild constipation. (Patient not taking: Reported on 03/09/2018) 30 tablet 0  . zinc sulfate 220 (50 Zn) MG capsule Take 1 capsule (220 mg total) by mouth daily. (Patient not taking: Reported on 03/09/2018) 30 capsule 0    Objective: BP (!) 150/83 (BP Location: Left Wrist)   Pulse 82   Temp 97.6 F (36.4 C) (Oral)   Resp 15   Ht 5\' 5"  (1.651 m)   Wt 250 lb 14.1 oz (113.8 kg)   SpO2 96%   BMI 41.75 kg/m  Exam: General: NAD, pleasant, obese, lethargic and slow to answer Eyes: PERRL, EOMI, no conjunctival pallor or injection ENTM: Moist mucous membranes, no pharyngeal erythema or exudate Neck: Supple, no LAD, no JVD  Cardiovascular: irregular rhythm and regular rate, no m/r/g, 2+ pitting BLLE edema to knees, 2+ pedal pulses present Respiratory: decreased air exchange with minimal diffuse wheezing, no crackles present, normal work of breathing, exam may be limited due to body habitus Gastrointestinal: soft, nontender, nondistended, normoactive BS MSK: moves 4 extremities equally, strength 4/5 in BLUE and BLLE with ace wrap applied to LLE. 3rd toe on R foot with blue discoloration Derm: warm, dry, chronic venous changes  of skin on bilateral LE  Neuro: CN II-XII grossly intact, motor strength 5/5 bilaterally in LE, sensation intact Psych: AOx3, appropriate affect  Labs and  Imaging: CBC BMET  Recent Labs  Lab 03/09/18 1229  WBC 9.9  HGB 10.9*  HCT 37.3  PLT 200   Recent Labs  Lab 03/09/18 1229  NA 142  K 5.3*  CL 99*  CO2 31  BUN 59*  CREATININE 2.25*  GLUCOSE 178*  CALCIUM 9.6     PT 68.3, INR 8.28 Influenza pending ABG pending  BNP    Component Value Date/Time   BNP 768.6 (H) 03/09/2018 1400   Dg Chest 2 View  Result Date: 03/09/2018 CLINICAL DATA:  Shortness of breath and chest pain EXAM: CHEST - 2 VIEW COMPARISON:  August 31, 2017 FINDINGS: There is no edema or consolidation. Heart is mildly enlarged with pulmonary vascularity within normal limits. There is aortic atherosclerosis. No adenopathy. There is an old healed fracture of the left clavicle. IMPRESSION: Cardiomegaly.  Aortic atherosclerosis.  No edema or consolidation. Aortic Atherosclerosis (ICD10-I70.0). Electronically Signed   By: Bretta Bang III M.D.   On: 03/09/2018 13:30   [place] [name], DO  PGY-1, [facility] Family Medicine FPTS Intern pager: [phone], text pages welcome   I have separately seen and evaluated the above patient with Dr. Talbert Forest and agree with her documentation.  I have included my edits in blue.   Freddrick March, MD [facility], PGY-2

## 2018-03-09 NOTE — Progress Notes (Signed)
Patient arrived to [id number]. Patient placed on bipap. Vitals stable. Negative flu A & B. Droplet precautions d/c'd per MD. Patient made NPO

## 2018-03-09 NOTE — Telephone Encounter (Signed)
[facility] pharmacy calling for clarification of warfarin dose and recent INR. Advised INR on 03/06/18 was 4.4, and her wafarin dose as of 03/06/18 was to hold x 2 day, the 3 mg daily. Sylvia OrleansMeredith B Lesslie Mossa, RN

## 2018-03-09 NOTE — Progress Notes (Signed)
Patient was transported to 4E room 19 without any complications. [facility] RRT.

## 2018-03-09 NOTE — ED Notes (Signed)
ED Provider at bedside. 

## 2018-03-09 NOTE — Plan of Care (Signed)
  Not Progressing Health Behavior/Discharge Planning: Ability to manage health-related needs will improve 03/09/2018 2343 - Not Progressing by Jill SideNiemela, Zoanne Newill R, RN Clinical Measurements: Respiratory complications will improve 03/09/2018 2343 - Not Progressing by Jill SideNiemela, Tea Collums R, RN Activity: Risk for activity intolerance will decrease 03/09/2018 2343 - Not Progressing by Jill SideNiemela, Cyd Hostler R, RN

## 2018-03-10 DIAGNOSIS — J9621 Acute and chronic respiratory failure with hypoxia: Secondary | ICD-10-CM

## 2018-03-10 DIAGNOSIS — E662 Morbid (severe) obesity with alveolar hypoventilation: Secondary | ICD-10-CM

## 2018-03-10 DIAGNOSIS — J9622 Acute and chronic respiratory failure with hypercapnia: Secondary | ICD-10-CM

## 2018-03-10 LAB — CBC
HCT: 36.5 % (ref 36.0–46.0)
Hemoglobin: 10.7 g/dL — ABNORMAL LOW (ref 12.0–15.0)
MCH: 31 pg (ref 26.0–34.0)
MCHC: 29.3 g/dL — ABNORMAL LOW (ref 30.0–36.0)
MCV: 105.8 fL — ABNORMAL HIGH (ref 78.0–100.0)
PLATELETS: 192 10*3/uL (ref 150–400)
RBC: 3.45 MIL/uL — AB (ref 3.87–5.11)
RDW: 16 % — ABNORMAL HIGH (ref 11.5–15.5)
WBC: 6.4 10*3/uL (ref 4.0–10.5)

## 2018-03-10 LAB — GLUCOSE, CAPILLARY
GLUCOSE-CAPILLARY: 112 mg/dL — AB (ref 65–99)
GLUCOSE-CAPILLARY: 226 mg/dL — AB (ref 65–99)
Glucose-Capillary: 123 mg/dL — ABNORMAL HIGH (ref 65–99)
Glucose-Capillary: 157 mg/dL — ABNORMAL HIGH (ref 65–99)

## 2018-03-10 LAB — PROTIME-INR
INR: 5.65
PROTHROMBIN TIME: 82.7 s — AB (ref 11.4–15.2)
Prothrombin Time: 50.7 seconds — ABNORMAL HIGH (ref 11.4–15.2)

## 2018-03-10 LAB — BASIC METABOLIC PANEL
ANION GAP: 11 (ref 5–15)
BUN: 65 mg/dL — ABNORMAL HIGH (ref 6–20)
CALCIUM: 9.2 mg/dL (ref 8.9–10.3)
CO2: 32 mmol/L (ref 22–32)
CREATININE: 2.31 mg/dL — AB (ref 0.44–1.00)
Chloride: 99 mmol/L — ABNORMAL LOW (ref 101–111)
GFR, EST AFRICAN AMERICAN: 24 mL/min — AB (ref >60)
GFR, EST NON AFRICAN AMERICAN: 21 mL/min — AB (ref >60)
Glucose, Bld: 133 mg/dL — ABNORMAL HIGH (ref 65–99)
Potassium: 4.7 mmol/L (ref 3.5–5.1)
SODIUM: 142 mmol/L (ref 135–145)

## 2018-03-10 LAB — TROPONIN I
TROPONIN I: 0.48 ng/mL — AB (ref <0.03)
TROPONIN I: 0.64 ng/mL — AB (ref <0.03)

## 2018-03-10 LAB — TSH: TSH: 8.132 u[IU]/mL — ABNORMAL HIGH (ref 0.350–4.500)

## 2018-03-10 LAB — T4, FREE: FREE T4: 0.9 ng/dL (ref 0.61–1.12)

## 2018-03-10 MED ORDER — LEVOTHYROXINE SODIUM 75 MCG PO TABS
150.0000 ug | ORAL_TABLET | Freq: Every day | ORAL | Status: DC
  Administered 2018-03-11 – 2018-03-17 (×7): 150 ug via ORAL
  Filled 2018-03-10 (×7): qty 2

## 2018-03-10 NOTE — Progress Notes (Signed)
Pt alert and responsive, indicates that breathing is doing better.  Trial off of NIV, adequate SpO2 on 2-3L [place], pt has strong cough, slightly productive for small amount yellow/green secretions.  Closely monitor, pt advised to call RN if SOB or difficulty breathing.  Discussed with RN.

## 2018-03-10 NOTE — Progress Notes (Signed)
PT Cancellation Note  Patient Details Name: Sylvia NightDebbie Gopaul MRN: [id number] DOB: 1951-10-09   Cancelled Treatment:    Reason Eval/Treat Not Completed: Medical issues which prohibited therapy. Pt with INR > 10 and high fall risk. Pt received vit K. Will check back later today after INR rechecked.   Angelina OkCary W [facility] 03/10/2018, 11:36 AM

## 2018-03-10 NOTE — Progress Notes (Signed)
Occupational Therapy Evaluation Patient Details Name: Sylvia Craig MRN: [id number] DOB: 12-May-1951 Today's Date: 03/10/2018    History of Present Illness Sylvia Craig is a 67 y.o. female presenting with shortness of breath. PMH is significant for HTN, hypothyroidism, HFmrEF, HLD, [name], insulin-dependent diabetes mellitus, and atrial fibrillation on coumadin.   Clinical Impression   PTA Pt mod A for bathing (aide comes 3x a week), and got assist from son for LB dressing and IADL. Pt is currently max A for LB ADL min A +2 for transfers. Today pt with INR of 10 earlier and now down to 5+ so mobility limited to bed to chair due to fall risk. Pt motivated to return home at most independent level. Pt will benefit from skilled OT in the acute setting to maximize safety and independence in ADL and transfers prior to dc home with assist from son/aide.    Follow Up Recommendations  No OT follow up;Supervision - Intermittent([facility] with [facility])    Equipment Recommendations  None recommended by OT(Pt has appropriate DME)    Recommendations for Other Services       Precautions / Restrictions Precautions Precautions: Fall Precaution Comments: High INR Restrictions Weight Bearing Restrictions: No      Mobility Bed Mobility Overal bed mobility: Needs Assistance Bed Mobility: Supine to Sit     Supine to sit: Min assist;+2 for safety/equipment     General bed mobility comments: Assist to move legs off bed, elevate trunk into sitting and bring hips to EOB  Transfers Overall transfer level: Needs assistance Equipment used: Rolling walker (2 wheeled) Transfers: Sit to/from [employer] CorporationStand;Stand Pivot Transfers Sit to Stand: Min assist;+2 safety/equipment Stand pivot transfers: Min assist;+2 safety/equipment       General transfer comment: Assist for balance and safety. Pivotal steps with walker from bed to recliner.    Balance Overall balance assessment: Needs  assistance Sitting-balance support: No upper extremity supported;Feet supported Sitting balance-Leahy Scale: Fair     Standing balance support: Bilateral upper extremity supported Standing balance-Leahy Scale: Poor Standing balance comment: walker and min guard for static standing                           ADL either performed or assessed with clinical judgement   ADL Overall ADL's : Needs assistance/impaired Eating/Feeding: Modified independent   Grooming: Set up;Sitting   Upper Body Bathing: Moderate assistance   Lower Body Bathing: Maximal assistance   Upper Body Dressing : Set up   Lower Body Dressing: Moderate assistance;+2 for safety/equipment   Toilet Transfer: Minimal assistance;+2 for safety/equipment;Stand-pivot;BSC;Requires wide/bariatric   Toileting- Clothing Manipulation and Hygiene: Maximal assistance;Sit to/from stand Toileting - Clothing Manipulation Details (indicate cue type and reason): due to incontinence, Pt wears adult diapers at home     Functional mobility during ADLs: Minimal assistance;+2 for safety/equipment;Rolling walker(SPT only this session)       Vision Patient Visual Report: No change from baseline       Perception     Praxis      Pertinent Vitals/Pain Pain Assessment: No/denies pain     Hand Dominance Right   Extremity/Trunk Assessment Upper Extremity Assessment Upper Extremity Assessment: Generalized weakness   Lower Extremity Assessment Lower Extremity Assessment: Defer to PT evaluation       Communication Communication Communication: No difficulties   Cognition Arousal/Alertness: Awake/alert Behavior During Therapy: WFL for tasks assessed/performed Overall Cognitive Status: Within Functional Limits for tasks assessed  General Comments       Exercises     Shoulder Instructions      Home Living Family/patient expects to be discharged to:: Private  residence Living Arrangements: Children Available Help at Discharge: Family;Available 24 hours/day;Personal care attendant(aide 3x/wk) Type of Home: House Home Access: Stairs to enter [employer] of Steps: 2+1 Entrance Stairs-Rails: Left Home Layout: One level     Bathroom Shower/Tub: Chief Strategy Officer: Standard     Home Equipment: Cane - single point;Tub bench;Walker - 2 wheels(lt platform for walker)   Additional Comments: Home O2 2L      Prior Functioning/Environment Level of Independence: Needs assistance  Gait / Transfers Assistance Needed: Modified independent with walker or cane ADL's / Homemaking Assistance Needed: assist from aide 3 days a week   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 [employer]        OT Problem List: Decreased activity tolerance;Impaired balance (sitting and/or standing);Cardiopulmonary status limiting activity;Obesity      OT Treatment/Interventions: Therapeutic exercise;Energy conservation;Balance training    OT Goals(Current goals can be found in the care plan section) Acute Rehab OT Goals Patient Stated Goal: return home OT Goal Formulation: With patient Time For Goal Achievement: 03/24/18 Potential to Achieve Goals: Good ADL Goals Pt Will Perform Grooming: with supervision;sitting Pt Will Transfer to Toilet: with supervision;ambulating Pt Will Perform Toileting - Clothing Manipulation and hygiene: with modified independence;sit to/from stand Additional ADL Goal #1: Pt will recall and implement energy conservation techniques for ADL at mod I level  OT Frequency: Min 2X/week   Barriers to D/C:            Co-evaluation PT/OT/SLP Co-Evaluation/Treatment: Yes Reason for Co-Treatment: For patient/therapist safety PT goals addressed during session: Mobility/safety with mobility OT goals addressed during session: ADL's and self-care      AM-PAC PT "6 Clicks" Daily Activity     Outcome  Measure Help from another person eating meals?: None Help from another person taking care of personal grooming?: A Little Help from another person toileting, which includes using toliet, bedpan, or urinal?: A Little Help from another person bathing (including washing, rinsing, drying)?: A Lot Help from another person to put on and taking off regular upper body clothing?: A Little Help from another person to put on and taking off regular lower body clothing?: A Lot 6 Click Score: 17   End of Session Equipment Utilized During Treatment: Rolling walker;Oxygen Nurse Communication: Mobility status  Activity Tolerance: Patient tolerated treatment well Patient left: in chair;with call bell/phone within reach  OT Visit Diagnosis: Unsteadiness on feet (R26.81);Other abnormalities of gait and mobility (R26.89);Muscle weakness (generalized) (M62.81)                Time: 1610-9604 OT Time Calculation (min): 25 min Charges:  OT General Charges $OT Visit: 1 Visit OT Evaluation $OT Eval Moderate Complexity: 1 Mod G-Codes:     Sherryl Manges OTR/L [phone]  Evern Bio Ainsley Sanguinetti 03/10/2018, 5:49 PM

## 2018-03-10 NOTE — Progress Notes (Signed)
CRITICAL VALUE ALERT  Critical Value:  INR: 5.65  Date & Time Notied: 03/09/2018 @ 1550  Provider Notified: Family Med Inpt service pager via text page  Orders Received/Actions taken: awaiting orders

## 2018-03-10 NOTE — Evaluation (Signed)
Physical Therapy Evaluation Patient Details Name: Sylvia Craig MRN: [id number] DOB: [birth year] Today's Date: 03/10/2018   History of Present Illness  Sylvia Craig is a 67 y.o. female presenting with shortness of breath. PMH is significant for HTN, hypothyroidism, HFmrEF, HLD, [name], insulin-dependent diabetes mellitus, and atrial fibrillation on coumadin.  Clinical Impression  Pt admitted with above diagnosis and presents to PT with functional limitations due to deficits listed below (See PT problem list). Pt needs skilled PT to maximize independence and safety to allow discharge to home with son. Expect pt to make good progress back toward baseline. Today pt with INR of 10 earlier and now down to 5+ so mobility limited to bed to chair due to fall risk. Pt motivated to return home at most independent level.     Follow Up Recommendations Home health PT;Supervision for mobility/OOB    Equipment Recommendations  None recommended by PT    Recommendations for Other Services       Precautions / Restrictions Precautions Precautions: Fall Precaution Comments: High INR Restrictions Weight Bearing Restrictions: No      Mobility  Bed Mobility Overal bed mobility: Needs Assistance Bed Mobility: Supine to Sit     Supine to sit: Min assist;+2 for safety/equipment     General bed mobility comments: Assist to move legs off bed, elevate trunk into sitting and bring hips to EOB  Transfers Overall transfer level: Needs assistance Equipment used: Rolling walker (2 wheeled) Transfers: Sit to/from [employer] CorporationStand;Stand Pivot Transfers Sit to Stand: Min assist;+2 safety/equipment Stand pivot transfers: Min assist;+2 safety/equipment       General transfer comment: Assist for balance and safety. Pivotal steps with walker from bed to recliner.  Ambulation/Gait             General Gait Details: Did not go beyond bed to chair due to elevated INR  Stairs            Wheelchair Mobility     Modified Rankin (Stroke Patients Only)       Balance Overall balance assessment: Needs assistance Sitting-balance support: No upper extremity supported;Feet supported Sitting balance-Leahy Scale: Fair     Standing balance support: Bilateral upper extremity supported Standing balance-Leahy Scale: Poor Standing balance comment: walker and min guard for static standing                             Pertinent Vitals/Pain Pain Assessment: No/denies pain    Home Living Family/patient expects to be discharged to:: Private residence Living Arrangements: Children Available Help at Discharge: Family;Available 24 hours/day;Personal care attendant(aide 3x/wk) Type of Home: House Home Access: Stairs to enter Entrance Stairs-Rails: Left Entrance Stairs-Number of Steps: 2+1 Home Layout: One level Home Equipment: Cane - single point;Tub bench;Walker - 2 wheels(lt platform for walker) Additional Comments: Home O2 2L    Prior Function Level of Independence: Needs assistance   Gait / Transfers Assistance Needed: Modified independent with walker or cane     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 [employer]     Hand Dominance   Dominant Hand: Right    Extremity/Trunk Assessment   Upper Extremity Assessment Upper Extremity Assessment: Defer to OT evaluation    Lower Extremity Assessment Lower Extremity Assessment: Generalized weakness       Communication   Communication: No difficulties  Cognition Arousal/Alertness: Awake/alert Behavior During Therapy: WFL for tasks assessed/performed Overall Cognitive Status: Within Functional Limits for tasks assessed  General Comments      Exercises     Assessment/Plan    PT Assessment Patient needs continued PT services  PT Problem List Decreased strength;Decreased activity tolerance;Decreased balance;Decreased mobility;Obesity       PT Treatment Interventions  DME instruction;Gait training;Functional mobility training;Therapeutic activities;Therapeutic exercise;Balance training;Patient/family education    PT Goals (Current goals can be found in the Care Plan section)  Acute Rehab PT Goals Patient Stated Goal: return home PT Goal Formulation: With patient Time For Goal Achievement: 03/17/18 Potential to Achieve Goals: Good    Frequency Min 3X/week   Barriers to discharge        Co-evaluation PT/OT/SLP Co-Evaluation/Treatment: Yes Reason for Co-Treatment: For patient/therapist safety PT goals addressed during session: Mobility/safety with mobility         AM-PAC PT "6 Clicks" Daily Activity  Outcome Measure Difficulty turning over in bed (including adjusting bedclothes, sheets and blankets)?: Unable Difficulty moving from lying on back to sitting on the side of the bed? : Unable Difficulty sitting down on and standing up from a chair with arms (e.g., wheelchair, bedside commode, etc,.)?: Unable Help needed moving to and from a bed to chair (including a wheelchair)?: A Little Help needed walking in hospital room?: A Little Help needed climbing 3-5 steps with a railing? : A Lot 6 Click Score: 11    End of Session Equipment Utilized During Treatment: Gait belt;Oxygen Activity Tolerance: Patient tolerated treatment well Patient left: in chair;with call bell/phone within reach Nurse Communication: Mobility status PT Visit Diagnosis: Unsteadiness on feet (R26.81);Muscle weakness (generalized) (M62.81)    Time:  -      Charges:         PT G Codes:        [facility] PT [phone]   Angelina Ok [facility] 03/10/2018, 4:54 PM

## 2018-03-10 NOTE — Progress Notes (Signed)
Family Medicine Teaching Service Daily Progress Note Intern Pager: [phone]  Patient name: Sylvia Craig Medical record number: [id number] Date of birth: 1951-07-19 Age: 67 y.o. Gender: female  Primary Care Provider: [name], MD Consultants: none Code Status: Full  Pt Overview and Major Events to Date:  03/14: Admitted and placed on BiPAP  Assessment and Plan: Sylvia Craig is a 67 y.o. female presenting with shortness of breath. PMH is significant for HTN, hypothyroidism, HFmrEF, HLD, [name], insulin-dependent diabetes mellitus, and atrial fibrillation on coumadin.  SOB:  Do not suspect DVT with lower leg swelling given it is bilateral and without calf tenderness.  Low suspicion for PE with low Well's score of 1.5.  Liikely CHF exacerbation given BNP elevated to 768. Did not take her Lasix this morning, is on 40mg  daily, but did take some extra for a few days to help. CXR with cardiomegaly and no active disease or edema. No evidence of pneumonia on imaging.   On physical exam with 2+ pitting edema on exam and no crackles. Could also be a component of obesity hypoventilation syndrome. Wheezing noted on exam. Patient does have history of pulmonary htn as well.   -continuous cardiac monitoring - ABG consistent with chronic compensated respiratory acidosis and has improved after BiPAP therapy, now on 3L [place] - Influenza negtiva - IV Lasix x1, patient with little recorded output, but reportedly had more  - Monitor respiratory status - supplemental O2 PRN  - continuous pulse ox  Acute on Chronic HFmrEF: Appears hypervolemic on admission.Last ECHO 08/2017 with EF of 45-50%.Baseline weight appears to be ~250lb during recent office visits, 250 lb today.   At home on Metoprolol XL 100 mg daily.  -Continue home Toprol - Daily wts and strict I/O's -Lasix40 meq IV x1; will continue home lasix today   AKI on CKD, III: SCr is 2.25>2.31, up from 1.6 in October. Possibly cardiorenal given  fluid overload and history of HfmrEF.  - avoid nephrotoxic agents  Elevated troponin [name]: Initial troponin elevated to 0.36>0.48. There is evidence for [name] with aortic atherosclerosis on CXR.She denies any chest pain or tightness. Previous admission with troponin elevated to 1.22. - Consider cardiology consult -Continue cardiac monitoring - trend troponins x3 0.48>0.57>0.64>0.48 - AM EKGwith no changes from previous  Atrial fibrillation with RVR: Rate in 90's while in the room without any medications. Supra-therapeutic INR of 8.2 on admission.   -CHADS-VASc is 67 (age, gender, CHF, DM, [name]) -Managed with coudmadin and Toprol at home -Will hold off on anticoagulation given supra-therapeutic INR -Continue Toprol as tolerated  Pulmonary HTN: PA peak pressure: 62 mm Hg on ECHO 08/2017.  - Has supplemental oxygen at home, baseline 2-3L  Hypothyroidism.  Do not see recent TSH on Care Everywhere apart from 2011 when it was well controlled.  At home on Synthroid 137 mcg.   -Continue home Synthroid -repeat TSH 8.132 and Free T4 0.90  Insulin-dependent DM: Recent A1c 6.6 on 02/21/2018 -Managed at home with Humalog 4U TID -Monitor CBG's - sSSI  Hyperkalemia: Improving. Serum potassium 5.3> 4.7 -No EKG changes noted - BMP in the AM   Supra-therapeutic INR of 8.2: >10 today. On coumadin at home. Hgb 10.9. No hematochezia noted and patient denies any active bleeding.  - Discussed with pharmacy, s/p Vitamin K 2.5mg  x1  - Monitor for signs of bleeding - Will re-check at 2pm for INR after original vitamin K has time to work  Leg wounds: Bilateral LE's with stigmata of chronic venous stasis -  There are some wounds bilaterally with serous weeping and surrounding erythema  - Wound care consulted for recs  Anemia: Hgb 10.9 on admission (BL 10-11). At home on daily ferrous sulfate 325 mg.  In setting of CHF, transfusion threshold would be 8.0.   -monitor with daily CBC    FEN/GI: Heart healthy/ Carb modified Prophylaxis: holding due to supra-therapeutic INR (goal 2-3)   Disposition: continued inpatient stay  Subjective:  Patient is feeling much better today. She is more with it and able to discuss more. Reports she take her thyroid medication daily.   Objective: Temp:  [96 F (35.6 C)-97.6 F (36.4 C)] 96.2 F (35.7 C) (03/15 0400) Pulse Rate:  [61-107] 61 (03/15 0400) Resp:  [15-27] 16 (03/15 0400) BP: (103-150)/(58-93) 103/58 (03/15 0404) SpO2:  [93 %-100 %] 99 % (03/15 0400) FiO2 (%):  [28 %] 28 % (03/14 1750) Weight:  [250 lb 14.1 oz (113.8 kg)-251 lb (113.9 kg)] 250 lb 14.1 oz (113.8 kg) (03/14 2103) Physical Exam: General: NAD, pleasant Eyes: PERRL, EOMI, no conjunctival pallor or injection ENTM: Moist mucous membranes Cardiovascular: RRR, no m/r/g, 2+ pitting LE edema Respiratory: better air exchange with some wheezing noted in upper BL, normal work of breathing on 3L, difficult due to body habitus Gastrointestinal: soft, nontender, nondistended, normoactive BS MSK: moves 4 extremities equally Derm: no rashes appreciated Neuro: CN II-XII grossly intact Psych: AOx3, appropriate affect  Laboratory: Recent Labs  Lab 03/07/18 1435 03/09/18 1229  WBC  --  9.9  HGB 10.0* 10.9*  HCT  --  37.3  PLT  --  200   Recent Labs  Lab 03/09/18 1229  NA 142  K 5.3*  CL 99*  CO2 31  BUN 59*  CREATININE 2.25*  CALCIUM 9.6  GLUCOSE 178*     Imaging/Diagnostic Tests: Dg Chest 2 View  Result Date: 03/09/2018 CLINICAL DATA:  Shortness of breath and chest pain EXAM: CHEST - 2 VIEW COMPARISON:  August 31, 2017 FINDINGS: There is no edema or consolidation. Heart is mildly enlarged with pulmonary vascularity within normal limits. There is aortic atherosclerosis. No adenopathy. There is an old healed fracture of the left clavicle. IMPRESSION: Cardiomegaly.  Aortic atherosclerosis.  No edema or consolidation. Aortic Atherosclerosis  (ICD10-I70.0). Electronically Signed   By: Bretta BangWilliam  Woodruff III M.D.   On: 03/09/2018 13:30     Madalin Hughart, [place], DO 03/10/2018, 6:41 AM PGY-1, [facility] FPTS Intern pager: [phone], text pages welcome

## 2018-03-11 ENCOUNTER — Inpatient Hospital Stay (HOSPITAL_COMMUNITY): Payer: Medicare Other

## 2018-03-11 LAB — GLUCOSE, CAPILLARY
GLUCOSE-CAPILLARY: 133 mg/dL — AB (ref 65–99)
Glucose-Capillary: 152 mg/dL — ABNORMAL HIGH (ref 65–99)
Glucose-Capillary: 186 mg/dL — ABNORMAL HIGH (ref 65–99)
Glucose-Capillary: 177 mg/dL — ABNORMAL HIGH (ref 65–99)

## 2018-03-11 LAB — PROTIME-INR
INR: 3.58
Prothrombin Time: 35.4 seconds — ABNORMAL HIGH (ref 11.4–15.2)

## 2018-03-11 LAB — BASIC METABOLIC PANEL
Anion gap: 10 (ref 5–15)
BUN: 73 mg/dL — ABNORMAL HIGH (ref 6–20)
CHLORIDE: 97 mmol/L — AB (ref 101–111)
CO2: 32 mmol/L (ref 22–32)
Calcium: 9.3 mg/dL (ref 8.9–10.3)
Creatinine, Ser: 2.34 mg/dL — ABNORMAL HIGH (ref 0.44–1.00)
GFR calc non Af Amer: 20 mL/min — ABNORMAL LOW (ref >60)
GFR, EST AFRICAN AMERICAN: 23 mL/min — AB (ref >60)
Glucose, Bld: 194 mg/dL — ABNORMAL HIGH (ref 65–99)
POTASSIUM: 5 mmol/L (ref 3.5–5.1)
SODIUM: 139 mmol/L (ref 135–145)

## 2018-03-11 LAB — CBC
HEMATOCRIT: 40.2 % (ref 36.0–46.0)
HEMOGLOBIN: 11.5 g/dL — AB (ref 12.0–15.0)
MCH: 30.7 pg (ref 26.0–34.0)
MCHC: 28.6 g/dL — ABNORMAL LOW (ref 30.0–36.0)
MCV: 107.2 fL — AB (ref 78.0–100.0)
PLATELETS: 197 10*3/uL (ref 150–400)
RBC: 3.75 MIL/uL — AB (ref 3.87–5.11)
RDW: 15.5 % (ref 11.5–15.5)
WBC: 7.5 10*3/uL (ref 4.0–10.5)

## 2018-03-11 LAB — BLOOD GAS, ARTERIAL
Acid-Base Excess: 11.4 mmol/L — ABNORMAL HIGH (ref 0.0–2.0)
Bicarbonate: 38.4 mmol/L — ABNORMAL HIGH (ref 20.0–28.0)
Drawn by: 28338
O2 Content: 2 L/min
O2 Saturation: 95.2 %
PH ART: 7.272 — AB (ref 7.350–7.450)
Patient temperature: 98.6
pCO2 arterial: 86 mmHg (ref 32.0–48.0)
pO2, Arterial: 79.2 mmHg — ABNORMAL LOW (ref 83.0–108.0)

## 2018-03-11 MED ORDER — WARFARIN SODIUM 1 MG PO TABS
1.0000 mg | ORAL_TABLET | Freq: Once | ORAL | Status: AC
  Administered 2018-03-11: 1 mg via ORAL
  Filled 2018-03-11: qty 1

## 2018-03-11 MED ORDER — WARFARIN - PHARMACIST DOSING INPATIENT
Freq: Every day | Status: DC
  Administered 2018-03-11 – 2018-03-14 (×4)

## 2018-03-11 NOTE — Progress Notes (Signed)
CRITICAL VALUE ALERT  Critical Value: PH 7.27    CO2 86   Date & Time Notied:  03/11/2018  1415  Provider Notified: Garth BignessKathryn Timberlake, MD   Orders Received/Actions taken: Will place patient back on Bipap. Called Respiratory [phone] and notified of action. Respiratory will put patient back on Bipap.

## 2018-03-11 NOTE — Progress Notes (Addendum)
ANTICOAGULATION CONSULT NOTE - Initial Consult  Pharmacy Consult for Warfarin Indication: atrial fibrillation  Patient Measurements: Height: 5\' 5"  (165.1 cm) Weight: 244 lb 14.9 oz (111.1 kg) IBW/kg (Calculated) : 57  Vital Signs: Temp: 97.3 F (36.3 C) (03/16 0756) Temp Source: Oral (03/16 0756) BP: 124/67 (03/16 0756) Pulse Rate: 72 (03/16 0756)  Labs: Recent Labs    03/09/18 1229  03/09/18 1943 03/09/18 2353 03/10/18 0621 03/10/18 1417 03/11/18 0308  HGB 10.9*  --   --   --  10.7*  --  11.5*  HCT 37.3  --   --   --  36.5  --  40.2  PLT 200  --   --   --  192  --  197  LABPROT  --    < >  --   --  82.7* 50.7* 35.4*  INR  --    < >  --   --  >10.00* 5.65* 3.58  CREATININE 2.25*  --   --   --  2.31*  --  2.34*  TROPONINI  --    < > 0.57* 0.64* 0.48*  --   --    < > = values in this interval not displayed.   Estimated Creatinine Clearance: 28.6 mL/min (A) (by C-G formula based on SCr of 2.34 mg/dL (H)).  Assessment: [age] yoF admitted for AoC respiratory failure, on warfarin PTA for h/o afib. Admit INR 8.28, s/p 2.5mg  IV vitK on 3/14. INR trending down nicely, 3.58 this AM. Pharmacy consulted to resume warfarin. Hgb 11.5, pltc WNL - stable. No bleeding noted. Note increased dose of Synthroid this admission - may enhance anticoagulant effect of warfarin.  PTA regimen: 3mg  PO daily (admit INR 8.28)  Goal of Therapy:  INR 2-3 Monitor platelets by anticoagulation protocol: Yes   Plan:  Warfarin 1mg  PO x1 Daily INR Monitor s/sx of bleeding  Baxter Gonzalez N. Zigmund Danieleja, PharmD PGY1 Pharmacy Resident Pager: [phone] 03/11/2018,9:16 AM

## 2018-03-11 NOTE — Progress Notes (Signed)
FPTS Interim Progress Note:   [facility] Results as expected, patient decompensating into hypercarbia again. Asked RN to call RT to place patient back on BiPAP. Consulted Dr. Delton CoombesByrum this am, appreciate pulm recs. Will continue to assess.   Loni MuseKate Lorilynn Lehr, MD

## 2018-03-11 NOTE — Consult Note (Signed)
PULMONARY / CRITICAL CARE MEDICINE   Name: Sylvia Craig MRN: [id number] DOB: 26-Feb-1951    ADMISSION DATE:  03/09/2018 CONSULTATION DATE: 03/11/2018  REFERRING MD: Teaching service  CHIEF COMPLAINT: Persistent altered mental status secondary to recurrent hypercarbia  HISTORY OF PRESENT ILLNESS:   67 year old female morbidly obese at 244 pounds, known obstructive sleep apnea noncompliant with CPAP for 5 years.  She was admitted on 03/09/2018 with chief complaints of shortness of breath and increasing oxygen needs.  She is oxygen dependent for several years with O2 at 2-3 L normally.  She notes she has required increasing levels of oxygen but still remains short of breath.  She has extensive past medical history is well-documented and includes OSA, atrial fibrillation on Coumadin, coronary disease, congestive heart failure, diabetes mellitus and morbid obesity.  Following admission she was treated with diuresis and noninvasive mechanical ventilatory support she is noted to have a PCO2 in the 70-80 range and when she was placed on BiPAP she became more awake and interactive.  She was taken off BiPAP and did not wear during the night of 03/10/2018 and again her PCO2 within the 70s and 80s she became lethargic and placed back on BiPAP but now awake alert and following commands.  She needs to be on BiPAP nocturnally every night.  All sedating medications have been discontinued.  She is to be on BiPAP as needed as needed.  Diuresis as tolerated.  She proves refractory to these interventions should be asked to be transferred to the intensive care unit and intubated.  Currently on 03/11/2018 at 1619 hrs. she does not need to be intubated and does not need to go to ICU at this time.  PAST MEDICAL HISTORY :  She  has a past medical history of [name] (coronary artery disease), native coronary artery, CHF (congestive heart failure) (HCC), CKD (chronic kidney disease), stage III (HCC), Diabetes mellitus without  complication (HCC), Hyperlipidemia LDL goal <70, Hypertension, Iron (Fe) deficiency anemia, and Thyroid disease.  PAST SURGICAL HISTORY: She  has a past surgical history that includes I&D extremity (Left, 07/24/2017); I&D extremity (Left, 07/27/2017); and Application of a-cell of extremity (Left, 07/27/2017).  Allergies  Allergen Reactions  . Codeine Rash and Other (See Comments)    Edema, also  . Glucosamine Shortness Of Breath  . Other Shortness Of Breath and Other (See Comments)    Crab = chest tightness, also  . Oxycodone Rash and Other (See Comments)    Bad dreams, also  . Shellfish Allergy Shortness Of Breath  . Shellfish-Derived Products Shortness Of Breath  . Atorvastatin Other (See Comments)    Muscle aches  . Broccoli [Brassica Oleracea Italica] Other (See Comments)    Very limited intake due to being on Coumadin  . Lisinopril Other (See Comments) and Cough    "Made me hurt all over," also  . Oxycodone-Aspirin Rash and Other (See Comments)    Bad dreams, also    No current facility-administered medications on file prior to encounter.    Current Outpatient Medications on File Prior to Encounter  Medication Sig  . acetaminophen (TYLENOL) 500 MG tablet Take 1,000 mg by mouth every 6 (six) hours as needed for moderate pain or headache.   . benzonatate (TESSALON) 200 MG capsule Take 1 capsule (200 mg total) by mouth 2 (two) times daily as needed for cough.  . cholecalciferol (VITAMIN D) 1000 units tablet TAKE 1 TABLET (1,000 UNITS TOTAL) BY MOUTH AT BEDTIME.  . ferrous sulfate 325 (65 FE)  MG EC tablet Take 1 tablet (325 mg total) by mouth 2 (two) times daily.  . furosemide (LASIX) 40 MG tablet Take 1 tablet (40 mg total) by mouth daily. May take additional 20mg  for leg swelling or shortness of breath.  . insulin aspart (NOVOLOG) 100 UNIT/ML injection Inject 9 Units into the skin 3 (three) times daily before meals. (Patient taking differently: Inject 4-6 Units into the skin 3  (three) times daily before meals. Inject 4-6 units into the skin 3 times a day after meals, per sliding scale)  . insulin glargine (LANTUS) 100 UNIT/ML injection Inject 0.12 mLs (12 Units total) into the skin at bedtime.  Marland Kitchen. levothyroxine (SYNTHROID, LEVOTHROID) 137 MCG tablet Take 1 tablet (137 mcg total) by mouth daily before breakfast.  . metoprolol succinate (TOPROL-XL) 100 MG 24 hr tablet Take 1 tablet (100 mg total) by mouth daily.  . Multiple Vitamin (MULTIVITAMIN WITH MINERALS) TABS tablet Take 1 tablet by mouth daily.  . nitroGLYCERIN (NITROSTAT) 0.4 MG SL tablet Take 0.4 mg under the toungue as needed for chest pain (Patient taking differently: Place 0.4 mg under the tongue every 5 (five) minutes as needed for chest pain. )  . rosuvastatin (CRESTOR) 10 MG tablet Take 1 tablet (10 mg total) by mouth at bedtime.  . vitamin C (ASCORBIC ACID) 500 MG tablet Take 2 tablets (1,000 mg total) by mouth at bedtime.  Marland Kitchen. warfarin (COUMADIN) 3 MG tablet 3 mg daily (Patient taking differently: Take 3 mg by mouth at bedtime. )  . zinc sulfate 220 (50 Zn) MG capsule TAKE ONE CAPSULE BY MOUTH EVERY DAY (Patient taking differently: Take 220 mg by mouth once a day)  . insulin aspart (NOVOLOG) 100 UNIT/ML injection Inject 0-9 Units into the skin 3 (three) times daily with meals. (Patient not taking: Reported on 03/09/2018)  . senna-docusate (SENOKOT-S) 8.6-50 MG tablet Take 1 tablet by mouth at bedtime as needed for mild constipation. (Patient not taking: Reported on 03/09/2018)  . zinc sulfate 220 (50 Zn) MG capsule Take 1 capsule (220 mg total) by mouth daily. (Patient not taking: Reported on 03/09/2018)    FAMILY HISTORY:  Her indicated that her mother is deceased. She indicated that her father is deceased. She indicated that the status of her neg hx is unknown.   SOCIAL HISTORY: She  reports that she quit smoking about 30 years ago. Her smoking use included cigarettes. She smoked 1.00 pack per day. she has  never used smokeless tobacco. She reports that she does not drink alcohol or use drugs.  REVIEW OF SYSTEMS:   10 point review of system taken, please see HPI for positives and negatives.   SUBJECTIVE:  67 year old female stable on noninvasive mechanical ventilatory support  VITAL SIGNS: BP (!) 117/57 (BP Location: Left Arm)   Pulse 86   Temp 98 F (36.7 C) (Oral)   Resp 10   Ht 5\' 5"  (1.651 m)   Wt 111.1 kg (244 lb 14.9 oz)   SpO2 100%   BMI 40.76 kg/m   HEMODYNAMICS:    VENTILATOR SETTINGS:    INTAKE / OUTPUT: I/O last 3 completed shifts: In: 360 [P.O.:360] Out: 1000 [Urine:1000]  PHYSICAL EXAMINATION: General: Morbidly obese female who is currently on noninvasive mechanical ventilatory support but is alert and orientated Neuro: Awake alert follows commands answers questions HEENT: Short neck, mild JVD Cardiovascular: Heart sounds are regular at this time Lungs: Decreased breath sounds throughout Abdomen: Obese positive bowel sounds Musculoskeletal: Lower extremities are 2-3+ edema,  appear reddened on both lower extremities Skin: Warm and dry  LABS:  BMET Recent Labs  Lab 03/09/18 1229 03/10/18 0621 03/11/18 0308  NA 142 142 139  K 5.3* 4.7 5.0  CL 99* 99* 97*  CO2 31 32 32  BUN 59* 65* 73*  CREATININE 2.25* 2.31* 2.34*  GLUCOSE 178* 133* 194*    Electrolytes Recent Labs  Lab 03/09/18 1229 03/10/18 0621 03/11/18 0308  CALCIUM 9.6 9.2 9.3    CBC Recent Labs  Lab 03/09/18 1229 03/10/18 0621 03/11/18 0308  WBC 9.9 6.4 7.5  HGB 10.9* 10.7* 11.5*  HCT 37.3 36.5 40.2  PLT 200 192 197    Coag's Recent Labs  Lab 03/10/18 0621 03/10/18 1417 03/11/18 0308  INR >10.00* 5.65* 3.58    Sepsis Markers No results for input(s): LATICACIDVEN, PROCALCITON, O2SATVEN in the last 168 hours.  ABG Recent Labs  Lab 03/09/18 1729 03/09/18 2013 03/11/18 1349  PHART 7.263* 7.315* 7.272*  PCO2ART 82.5* 75.2* 86.0*  PO2ART 148.0* 75.0* 79.2*     Liver Enzymes No results for input(s): AST, ALT, ALKPHOS, BILITOT, ALBUMIN in the last 168 hours.  Cardiac Enzymes Recent Labs  Lab 03/09/18 1943 03/09/18 2353 03/10/18 0621  TROPONINI 0.57* 0.64* 0.48*    Glucose Recent Labs  Lab 03/10/18 0639 03/10/18 1112 03/10/18 1621 03/10/18 2049 03/11/18 0625 03/11/18 1106  GLUCAP 123* 112* 157* 226* 152* 186*    Imaging No results found.   STUDIES:  03/11/2018 2D echo>>  CULTURES:   ANTIBIOTICS:   SIGNIFICANT EVENTS:   LINES/TUBES:   DISCUSSION: 67 year old female morbidly obese at 244 pounds, known obstructive sleep apnea noncompliant with CPAP for 5 years.  She was admitted on 03/09/2018 with chief complaints of shortness of breath and increasing oxygen needs.  She is oxygen dependent for several years with O2 at 2-3 L normally.  She notes she has required increasing levels of oxygen but still remains short of breath.  She has extensive past medical history is well-documented and includes OSA, atrial fibrillation on Coumadin, coronary disease, congestive heart failure, diabetes mellitus and morbid obesity.  Following admission she was treated with diuresis and noninvasive mechanical ventilatory support she is noted to have a PCO2 in the 70-80 range and when she was placed on BiPAP she became more awake and interactive.  She was taken off BiPAP and did not wear during the night of 03/10/2018 and again her PCO2 within the 70s and 80s she became lethargic and placed back on BiPAP but now awake alert and following commands.  She needs to be on BiPAP nocturnally every night.  All sedating medications have been discontinued.  She is to be on BiPAP as needed as needed.  Diuresis as tolerated.  She proves refractory to these interventions should be asked to be transferred to the intensive care unit and intubated.  Currently on 03/11/2018 at 1619 hrs. she does not need to be intubated and does not need to go to ICU at this  time.  ASSESSMENT / PLAN:  PULMONARY A: Chronic hypercarbic respiratory failure with acute exacerbation Reported COPD Untreated obstructive sleep apnea P:   Mandatory nocturnal BiPAP and as needed during the day as needed Avoid any  sedating medications If she proves refractory to BiPAP she may need to be transferred to intensive care unit and intubated.  She reports she would want to be intubated if needed.  When she is on BiPAP she comes very awake and cooperative.  I suspect her untreated sleep  apnea along with volume overload is contributing to her current worsening respiratory status. Again diuresis as tolerated BiPAP nocturnally and as needed Transfer to ICU if she fails current treatment modalities  CARDIOVASCULAR A:  Chronic congestive heart failure EF noted to be 45-50% Coronary artery disease Hypertension Atrial fibrillation she is followed by headache Katrinka Blazing of cardiology P:  Consider cards consult Again diuresis as tolerated noting her elevated creatinine.  RENAL Lab Results  Component Value Date   CREATININE 2.34 (H) 03/11/2018   CREATININE 2.31 (H) 03/10/2018   CREATININE 2.25 (H) 03/09/2018   Recent Labs  Lab 03/09/18 1229 03/10/18 0621 03/11/18 0308  K 5.3* 4.7 5.0   Recent Labs  Lab 03/09/18 1229 03/10/18 0621 03/11/18 0308  NA 142 142 139     A:   Acute renal insufficiency Normally on daily diuretic P:   Diuresis as tolerated Avoid nephrotoxic  GASTROINTESTINAL Filed Weights   03/09/18 1221 03/09/18 2103 03/11/18 0400  Weight: 113.9 kg (251 lb) 113.8 kg (250 lb 14.1 oz) 111.1 kg (244 lb 14.9 oz)   A:   Morbid obesity P:   Heart healthy diet  HEMATOLOGIC Recent Labs    03/10/18 0621 03/11/18 0308  HGB 10.7* 11.5*   Lab Results  Component Value Date   INR 3.58 03/11/2018   INR 5.65 (HH) 03/10/2018   INR >10.00 (HH) 03/10/2018    A:   Supratherapeutic INR On Coumadin for atrial fibrillation P:  Per primary  team  INFECTIOUS A:   No overt infectious process P:   White count 7, T-max is 98 3  ENDOCRINE CBG (last 3)  Recent Labs    03/10/18 2049 03/11/18 0625 03/11/18 1106  GLUCAP 226* 152* 186*    A:   Diabetes mellitus 2 P:   Per primary care team  NEUROLOGIC A:   Encephalopathic from hypercarbia that clears with BiPAP P:   RASS goal: 0 Hold all sedating medications   FAMILY  - Updates: Patient up to date at the bedside      [facility] Chauntay Paszkiewicz ACNP [facility] PCCM Pager [phone] till 1 pm If no answer page [phone] 03/11/2018, 4:07 PM

## 2018-03-11 NOTE — Progress Notes (Signed)
PT Cancellation Note  Patient Details Name: Sylvia Craig MRN: [id number] DOB: 02-Mar-1951   Cancelled Treatment:    Reason Eval/Treat Not Completed: Patient not medically ready.  Just placed back on BIPAP.  PCO2's are high and pt is lethargic.  Will see Monday as able. 03/11/2018  Bussey BingKen Seher Craig, PT [phone]  (pager)   Sylvia Craig 03/11/2018, 5:24 PM

## 2018-03-11 NOTE — Progress Notes (Signed)
Family Medicine Teaching Service Daily Progress Note Intern Pager: [phone]  Patient name: Sylvia Craig Medical record number: [id number] Date of birth: [birth year] Age: 67 y.o. Gender: female  Primary Care Provider: [name], MD Consultants: pulm to see 3/16 Code Status: Full  Pt Overview and Major Events to Date:  03/14: Admitted and placed on BiPAP 3/15: MS improved, back to [place]  Assessment and Plan: Sylvia Craig is a 67 y.o. female presenting with shortness of breath. PMH is significant for HTN, hypothyroidism, HFmrEF, HLD, [name], insulin-dependent diabetes mellitus, and atrial fibrillation on coumadin.  Acute on chronic respiratory failure: on home O2, etiology unclear, considering OHS. Consulted pulm c/w possible OHS. Flu negative. ABG consistent with chronic compensated respiratory acidosis and resp status has improved after BiPAP therapy. Patient becoming somewhat sleepy again today, repeat ABG now, may need BIPAP again.  -repeat ABG now -continuous cardiac monitoring -Monitor respiratory status -supplemental O2 PRN: 3L [place] at present -continuous pulse ox -consulted pulm Dr. Delton Coombes, appreciate recs  Acute on Chronic HFmrEF: euvolemic volume status today.Last ECHO 08/2017 with EF of 45-50%.Baseline weight appears to be ~250lb during recent office visits, 250 lb today. At home on Metoprolol XL 100 mg daily.  -Continue home Toprol - Daily wts and strict I/O's -restart home lasix of 40mg  PO daily  AKI on CKD, III: SCr is 2.25>2.31>2.34, up from 1.6 in October. Possibly cardiorenal given fluid overload and history of HfmrEF.  - avoid nephrotoxic agents  Elevated troponinCAD: trend 0.36>0.64>0.48. Previous admission with troponin elevated to 1.22. -Continue cardiac monitoring  Atrial fibrillation with RVR: rate controlled on home toprol XL. Supra-therapeutic INR of 8.2 on admission.   -CHADS-VASc is 24 (age, gender, CHF, DM, [name]) -Managed with coudmadin and Toprol  at home -Will hold off on anticoagulation given supra-therapeutic INR -Continue Toprol as tolerated  Supratherapeutic INR: home coumadin, INR on admit 8.2 > >10 > 5.65 > 3.58. S/p vitamin K.  -coumadin per pharm  Pulmonary HTN: PA peak pressure: 62 mm Hg on ECHO 08/2017.  - Has supplemental oxygen at home, baseline 2-3L  Hypothyroidism.  At home on Synthroid 137 mcg.  TSH elevated. -increased to synthroid 3/15  Insulin-dependent DM: Recent A1c 6.6 on 02/21/2018. home meds are lantus 12U and Humalog 4U TID.  - lantus 6U  - sSSI  Hyperkalemia: Improving. Serum potassium 5.3> 4.7. -No EKG changes noted - BMP in the AM   Leg wounds: Bilateral LE's with stigmata of chronic venous stasis -There are some wounds bilaterally with serous weeping and surrounding erythema  - Wound care consulted for recs  Anemia: Hgb 10.9 on admission (BL 10-11). At home on daily ferrous sulfate 325 mg.  In setting of CHF, transfusion threshold would be 8.0.   -monitor with daily CBC   FEN/GI: Heart healthy/ Carb modified Prophylaxis: holding due to supra-therapeutic INR (goal 2-3)   Disposition: continued inpatient stay  Subjective:  Patient with ~15 second delay in answering questions, but able to speak in long sentences when repeatedly questioned. No respiratory distress.   Objective: Temp:  [97.3 F (36.3 C)-98 F (36.7 C)] 97.3 F (36.3 C) (03/16 0756) Pulse Rate:  [61-95] 72 (03/16 0756) Resp:  [14-28] 20 (03/16 0756) BP: (120-127)/(55-94) 124/67 (03/16 0756) SpO2:  [97 %-100 %] 98 % (03/16 0756) Weight:  [244 lb 14.9 oz (111.1 kg)] 244 lb 14.9 oz (111.1 kg) (03/16 0400) Physical Exam: General: NAD, pleasant, somnolent Eyes: PERRL, EOMI, no conjunctival pallor or injection ENTM: Moist mucous membranes Cardiovascular: RRR,  no m/r/g, 2+ pitting LE edema Respiratory: few scattered wheezes, exam limited by body habitus, good air movement Gastrointestinal: soft, nontender,  nondistended, normoactive BS MSK: moves 4 extremities equally Derm: no rashes appreciated Neuro: CN II-XII grossly intact Psych: AOx3, appropriate affect  Laboratory: Recent Labs  Lab 03/09/18 1229 03/10/18 0621 03/11/18 0308  WBC 9.9 6.4 7.5  HGB 10.9* 10.7* 11.5*  HCT 37.3 36.5 40.2  PLT 200 192 197   Recent Labs  Lab 03/09/18 1229 03/10/18 0621 03/11/18 0308  NA 142 142 139  K 5.3* 4.7 5.0  CL 99* 99* 97*  CO2 31 32 32  BUN 59* 65* 73*  CREATININE 2.25* 2.31* 2.34*  CALCIUM 9.6 9.2 9.3  GLUCOSE 178* 133* 194*     Imaging/Diagnostic Tests: Dg Chest 2 View  Result Date: 03/09/2018 CLINICAL DATA:  Shortness of breath and chest pain EXAM: CHEST - 2 VIEW COMPARISON:  August 31, 2017 FINDINGS: There is no edema or consolidation. Heart is mildly enlarged with pulmonary vascularity within normal limits. There is aortic atherosclerosis. No adenopathy. There is an old healed fracture of the left clavicle. IMPRESSION: Cardiomegaly.  Aortic atherosclerosis.  No edema or consolidation. Aortic Atherosclerosis (ICD10-I70.0). Electronically Signed   By: Bretta BangWilliam  Woodruff III M.D.   On: 03/09/2018 13:30    Garth Bignessimberlake, Rollan Roger, MD 03/11/2018, 8:51 AM PGY-2, [facility] FPTS Intern pager: [phone], text pages welcome

## 2018-03-11 NOTE — Progress Notes (Signed)
  Echocardiogram 2D Echocardiogram has been performed.  Sylvia Craig, Sylvia Craig F 03/11/2018, 4:06 PM

## 2018-03-12 LAB — CBC
HCT: 40.2 % (ref 36.0–46.0)
Hemoglobin: 11.8 g/dL — ABNORMAL LOW (ref 12.0–15.0)
MCH: 30.9 pg (ref 26.0–34.0)
MCHC: 29.4 g/dL — AB (ref 30.0–36.0)
MCV: 105.2 fL — AB (ref 78.0–100.0)
PLATELETS: 216 10*3/uL (ref 150–400)
RBC: 3.82 MIL/uL — ABNORMAL LOW (ref 3.87–5.11)
RDW: 15.2 % (ref 11.5–15.5)
WBC: 7.9 10*3/uL (ref 4.0–10.5)

## 2018-03-12 LAB — BLOOD GAS, ARTERIAL
Acid-Base Excess: 12.7 mmol/L — ABNORMAL HIGH (ref 0.0–2.0)
Acid-Base Excess: 14.1 mmol/L — ABNORMAL HIGH (ref 0.0–2.0)
BICARBONATE: 38.5 mmol/L — AB (ref 20.0–28.0)
Bicarbonate: 40.3 mmol/L — ABNORMAL HIGH (ref 20.0–28.0)
DELIVERY SYSTEMS: POSITIVE
DELIVERY SYSTEMS: POSITIVE
DRAWN BY: 44135
Drawn by: 419771
EXPIRATORY PAP: 6
Expiratory PAP: 6
FIO2: 30
FIO2: 30
INSPIRATORY PAP: 14
Inspiratory PAP: 14
LHR: 12 {breaths}/min
O2 Saturation: 93.7 %
O2 Saturation: 93.9 %
PATIENT TEMPERATURE: 98.6
PO2 ART: 68.5 mmHg — AB (ref 83.0–108.0)
Patient temperature: 98.1
RATE: 12 resp/min
pCO2 arterial: 68.2 mmHg (ref 32.0–48.0)
pCO2 arterial: 75 mmHg (ref 32.0–48.0)
pH, Arterial: 7.37 (ref 7.350–7.450)
pH, Arterial: 7.348 — ABNORMAL LOW (ref 7.350–7.450)
pO2, Arterial: 68.9 mmHg — ABNORMAL LOW (ref 83.0–108.0)

## 2018-03-12 LAB — BASIC METABOLIC PANEL
Anion gap: 10 (ref 5–15)
BUN: 66 mg/dL — ABNORMAL HIGH (ref 6–20)
CO2: 36 mmol/L — ABNORMAL HIGH (ref 22–32)
CREATININE: 2.2 mg/dL — AB (ref 0.44–1.00)
Calcium: 9.6 mg/dL (ref 8.9–10.3)
Chloride: 97 mmol/L — ABNORMAL LOW (ref 101–111)
GFR calc Af Amer: 25 mL/min — ABNORMAL LOW (ref >60)
GFR, EST NON AFRICAN AMERICAN: 22 mL/min — AB (ref >60)
GLUCOSE: 139 mg/dL — AB (ref 65–99)
Potassium: 4.3 mmol/L (ref 3.5–5.1)
SODIUM: 143 mmol/L (ref 135–145)

## 2018-03-12 LAB — GLUCOSE, CAPILLARY
GLUCOSE-CAPILLARY: 114 mg/dL — AB (ref 65–99)
GLUCOSE-CAPILLARY: 163 mg/dL — AB (ref 65–99)
GLUCOSE-CAPILLARY: 191 mg/dL — AB (ref 65–99)
Glucose-Capillary: 105 mg/dL — ABNORMAL HIGH (ref 65–99)

## 2018-03-12 LAB — ECHOCARDIOGRAM COMPLETE
Height: 65 in
Weight: 3918.9 oz

## 2018-03-12 LAB — PROTIME-INR
INR: 2.39
Prothrombin Time: 25.9 seconds — ABNORMAL HIGH (ref 11.4–15.2)

## 2018-03-12 MED ORDER — WARFARIN SODIUM 2 MG PO TABS
2.0000 mg | ORAL_TABLET | Freq: Once | ORAL | Status: AC
  Administered 2018-03-12: 2 mg via ORAL
  Filled 2018-03-12: qty 1

## 2018-03-12 NOTE — Progress Notes (Signed)
ANTICOAGULATION CONSULT NOTE  Pharmacy Consult for Warfarin Indication: atrial fibrillation  Patient Measurements: Height: 5\' 5"  (165.1 cm) Weight: 245 lb 2.4 oz (111.2 kg) IBW/kg (Calculated) : 57  Labs: Recent Labs    03/09/18 1943 03/09/18 2353 03/10/18 0621 03/10/18 1417 03/11/18 0308 03/12/18 0310  HGB  --   --  10.7*  --  11.5* 11.8*  HCT  --   --  36.5  --  40.2 40.2  PLT  --   --  192  --  197 216  LABPROT  --   --  82.7* 50.7* 35.4* 25.9*  INR  --   --  >10.00* 5.65* 3.58 2.39  CREATININE  --   --  2.31*  --  2.34* 2.20*  TROPONINI 0.57* 0.64* 0.48*  --   --   --    Estimated Creatinine Clearance: 30.4 mL/min (A) (by C-G formula based on SCr of 2.2 mg/dL (H)).  Assessment: [age] yoF admitted for AoC respiratory failure, on warfarin PTA for h/o afib. Admit INR 8.28, s/p 2.5mg  IV vitK on 3/14. INR trending down nicely, 3.58 this AM. Pharmacy consulted to resume warfarin. Hgb 11.5, pltc WNL - stable. No bleeding noted.   Drug/Drug Interaction Potential: Synthroid + Warfarin - may enhance anticoagulant effect of warfarin.  PTA regimen: 3mg  PO daily (admit INR 8.28)  Goal of Therapy:  INR 2-3 Monitor platelets by anticoagulation protocol: Yes   Plan:  Warfarin 2mg  PO x1 Daily INR Monitor s/sx of bleeding  Nadara MustardNita Alean Kromer, PharmD., MS Clinical Pharmacist Pager:  [phone] Thank you for allowing pharmacy to be part of this patients care team. 03/12/2018,11:52 AM

## 2018-03-12 NOTE — Progress Notes (Signed)
Spoke with provider and explained that patient is still somewhat confused after wearing Bipap yesterday and last night. I also informed provider of patient's inability to take medications with sips of water without a significant amount of coughing. Provider advised me to keep patient on Bipap and she would come to check her as well as another provider will see her during rounds.

## 2018-03-12 NOTE — Progress Notes (Signed)
Evaluated patient bedside at 9:30 PM and she is resting comfortably.   Currently on bipap with O2 saturations >96%.  She has been on Bipap for the majority of the day.    Lungs clear on exam and without signs of increased work of breathing.   No delayed responses to questions and she is asleep but awakens easily and is alert at this time.  Discussed plan with RN, will obtain ABG tonight to follow up status.  Recommend keeping bipap on throughout the night per pulm recs. Will continue to monitor.    Freddrick MarchYashika Parrish Bonn, MD [facility], PGY-2

## 2018-03-12 NOTE — Significant Event (Signed)
Rapid Response Event Note  Overview: Increasing lethargy and work of breathing  Initial Focused Assessment: RN was concerned that patient was retaining CO2, patient was on BIPAP last but was transitioned to HFNC today.  Per RN, patient is more drowsy tonight and patient is more labored with her breathing. I instructed the RN to have RT place the patient back on BIPAP and that I could come evaluate the patient as soon I could. When I arrived, patient was resting, quickly awoke to being called, she nodded that she felt better with the BIPAP on, patient was able to follow commands, lungs clears, good air movement, not in distress and WOB had improved. Skin warm and dry, VSS.   Interventions: -- BIPAP  Plan of Care (if not transferred): -- RN to monitor patient's neuro and respiratory status, if no improvement, RN to page IMTS on call for orders (ie ABG).  Event Summary:   at    Call Time 1937 Arrival Time 1945 End Time 2010  Sylvia Craig R

## 2018-03-12 NOTE — Progress Notes (Signed)
Family Medicine Teaching Service Daily Progress Note Intern Pager: [phone]  Patient name: Sylvia Craig Durkee Medical record number: [id number] Date of birth: 01/27/1951 Age: 67 y.o. Gender: female  Primary Care Provider: Lovena Neighboursiallo, [name], MD Consultants: pulm to see 3/16 Code Status: Full  Pt Overview and Major Events to Date:  03/14: Admitted and placed on BiPAP 3/15: MS improved, back to [place]  Assessment and Plan: Sylvia Craig Pancake is a 67 y.o. female presenting with shortness of breath. PMH is significant for HTN, hypothyroidism, HFmrEF, HLD, [name], insulin-dependent diabetes mellitus, and atrial fibrillation on coumadin.  Acute on chronic respiratory failure: OHS per pulm.  ABG consistent with acute decompensated hypercarbic respiratory failure yesterday evening, Bipap all night. Plan to remove bipap this am if able. CO2 improved.  -continuous cardiac monitoring -Monitor respiratory status -supplemental O2 PRN: 3L [place] at present -continuous pulse ox -consulted pulm Dr. Delton CoombesByrum, appreciate recs  Acute on Chronic HFmrEF: euvolemic volume status today.Last ECHO 08/2017 with EF of 45-50%.Baseline weight appears to be ~250lb during recent office visits, 250 lb today. At home on Metoprolol XL 100 mg daily.  -Continue home Toprol - Daily wts and strict I/O's -restart home lasix of 40mg  PO daily  AKI on CKD, III: SCr is 2.25>2.31>2.34, up from 1.6 in October. Possibly cardiorenal given fluid overload and history of HfmrEF.  - avoid nephrotoxic agents  Elevated troponinCAD: trend 0.36>0.64>0.48. Previous admission with troponin elevated to 1.22. -Continue cardiac monitoring  Atrial fibrillation with RVR: rate controlled on home toprol XL. Supra-therapeutic INR of 8.2 on admission.   -CHADS-VASc is 285 (age, gender, CHF, DM, [name]) -Managed with coudmadin and Toprol at home -Will hold off on anticoagulation given supra-therapeutic INR -Continue Toprol as tolerated  Supratherapeutic INR:  home coumadin, INR on admit 8.2 > >10 > 5.65 > 3.58. S/p vitamin K.  -coumadin per pharm  Pulmonary HTN: PA peak pressure: 62 mm Hg on ECHO 08/2017.  - Has supplemental oxygen at home, baseline 2-3L  Hypothyroidism.  At home on Synthroid 137 mcg.  TSH elevated. -increased to 150mcg synthroid 3/15  Insulin-dependent DM: Recent A1c 6.6 on 02/21/2018. home meds are lantus 12U and Humalog 4U TID.  - lantus 6U  - sSSI  Hyperkalemia: Improving. Serum potassium 5.3> 4.7. -No EKG changes noted - BMP in the AM   Leg wounds: Bilateral LE's with stigmata of chronic venous stasis -There are some wounds bilaterally with serous weeping and surrounding erythema  - Wound care consulted for recs  Anemia: Hgb 10.9 on admission (BL 10-11). At home on daily ferrous sulfate 325 mg.  In setting of CHF, transfusion threshold would be 8.0.   -monitor with daily CBC   FEN/GI: Heart healthy/ Carb modified Prophylaxis: holding due to supra-therapeutic INR (goal 2-3)   Disposition: continued inpatient stay  Subjective:  Patient on Bipap, wants to eat. Denies pain. Responses seems appropriate without delay from yesterday.  Objective: Temp:  [97.6 F (36.4 C)-98.3 F (36.8 C)] 98.3 F (36.8 C) (03/17 0854) Pulse Rate:  [84-141] 101 (03/17 0601) Resp:  [10-25] 16 (03/17 0601) BP: (109-136)/(57-95) 124/73 (03/17 0854) SpO2:  [92 %-100 %] 95 % (03/17 0601) Weight:  [245 lb 2.4 oz (111.2 kg)] 245 lb 2.4 oz (111.2 kg) (03/17 0148) Physical Exam: General: NAD, pleasant, somnolent Eyes: PERRL, EOMI, no conjunctival pallor or injection ENTM: Moist mucous membranes Cardiovascular: RRR, no m/r/g, 2+ pitting LE edema Respiratory: few scattered wheezes, exam limited by body habitus, good air movement Gastrointestinal: soft, nontender, nondistended, normoactive BS MSK:  moves 4 extremities equally Derm: no rashes appreciated Neuro: CN II-XII grossly intact Psych: AOx3, appropriate  affect  Laboratory: Recent Labs  Lab 03/10/18 0621 03/11/18 0308 03/12/18 0310  WBC 6.4 7.5 7.9  HGB 10.7* 11.5* 11.8*  HCT 36.5 40.2 40.2  PLT 192 197 216   Recent Labs  Lab 03/10/18 0621 03/11/18 0308 03/12/18 0310  NA 142 139 143  K 4.7 5.0 4.3  CL 99* 97* 97*  CO2 32 32 36*  BUN 65* 73* 66*  CREATININE 2.31* 2.34* 2.20*  CALCIUM 9.2 9.3 9.6  GLUCOSE 133* 194* 139*     Imaging/Diagnostic Tests: Dg Chest 2 View  Result Date: 03/09/2018 CLINICAL DATA:  Shortness of breath and chest pain EXAM: CHEST - 2 VIEW COMPARISON:  August 31, 2017 FINDINGS: There is no edema or consolidation. Heart is mildly enlarged with pulmonary vascularity within normal limits. There is aortic atherosclerosis. No adenopathy. There is an old healed fracture of the left clavicle. IMPRESSION: Cardiomegaly.  Aortic atherosclerosis.  No edema or consolidation. Aortic Atherosclerosis (ICD10-I70.0). Electronically Signed   By: Bretta Bang III M.D.   On: 03/09/2018 13:30    Garth Bigness, MD 03/12/2018, 9:23 AM PGY-2, [place] FPTS Intern pager: [phone], text pages welcome

## 2018-03-12 NOTE — Progress Notes (Signed)
CRITICAL VALUE ALERT  Critical Value:  C02 68  Date & Time Notied: 03/12/18    245am  Provider Notified:pages Teaching service  Orders Received/Actions taken:Continue to keep patient on Bipap. Pt down from previous blood gas at 86.

## 2018-03-12 NOTE — Procedures (Signed)
Patient placed on Bipap at beginning of shift and an ABG was drawn around 2200.  PCO2 was 75 on settings of 14/6.  Increased RR to 14 and changed Bipap settings to 15/5.  Vt remain in the 400's, tolerating mask well.  Rapid response RN in room, patient responded to her voice and commands.  RT will continue to monitor.

## 2018-03-13 ENCOUNTER — Encounter (HOSPITAL_COMMUNITY): Payer: Self-pay | Admitting: General Practice

## 2018-03-13 DIAGNOSIS — G4733 Obstructive sleep apnea (adult) (pediatric): Secondary | ICD-10-CM

## 2018-03-13 LAB — BASIC METABOLIC PANEL
ANION GAP: 9 (ref 5–15)
BUN: 55 mg/dL — ABNORMAL HIGH (ref 6–20)
CO2: 36 mmol/L — ABNORMAL HIGH (ref 22–32)
Calcium: 9.5 mg/dL (ref 8.9–10.3)
Chloride: 101 mmol/L (ref 101–111)
Creatinine, Ser: 1.76 mg/dL — ABNORMAL HIGH (ref 0.44–1.00)
GFR calc Af Amer: 33 mL/min — ABNORMAL LOW (ref >60)
GFR, EST NON AFRICAN AMERICAN: 29 mL/min — AB (ref >60)
Glucose, Bld: 124 mg/dL — ABNORMAL HIGH (ref 65–99)
POTASSIUM: 4.9 mmol/L (ref 3.5–5.1)
SODIUM: 146 mmol/L — AB (ref 135–145)

## 2018-03-13 LAB — GLUCOSE, CAPILLARY
GLUCOSE-CAPILLARY: 115 mg/dL — AB (ref 65–99)
GLUCOSE-CAPILLARY: 182 mg/dL — AB (ref 65–99)
GLUCOSE-CAPILLARY: 216 mg/dL — AB (ref 65–99)
Glucose-Capillary: 202 mg/dL — ABNORMAL HIGH (ref 65–99)
Glucose-Capillary: 210 mg/dL — ABNORMAL HIGH (ref 65–99)

## 2018-03-13 LAB — CBC
HEMATOCRIT: 38.6 % (ref 36.0–46.0)
HEMOGLOBIN: 11.2 g/dL — AB (ref 12.0–15.0)
MCH: 30.6 pg (ref 26.0–34.0)
MCHC: 29 g/dL — ABNORMAL LOW (ref 30.0–36.0)
MCV: 105.5 fL — ABNORMAL HIGH (ref 78.0–100.0)
Platelets: 213 10*3/uL (ref 150–400)
RBC: 3.66 MIL/uL — ABNORMAL LOW (ref 3.87–5.11)
RDW: 15.6 % — ABNORMAL HIGH (ref 11.5–15.5)
WBC: 8.1 10*3/uL (ref 4.0–10.5)

## 2018-03-13 LAB — PROTIME-INR
INR: 2.18
Prothrombin Time: 24.1 seconds — ABNORMAL HIGH (ref 11.4–15.2)

## 2018-03-13 MED ORDER — WARFARIN SODIUM 2 MG PO TABS
2.0000 mg | ORAL_TABLET | Freq: Once | ORAL | Status: AC
  Administered 2018-03-13: 2 mg via ORAL
  Filled 2018-03-13: qty 1

## 2018-03-13 NOTE — Progress Notes (Signed)
Patient continues to exhibit signs of hypercapnia associated with chronic respiratory failure secondary to obesity hypoventilation syndrome.  Patient requires the use of NIV nightly to help with exacerbation periods.  The use of the NIV will treat patient's high PCO2 levels and can reduce risk of exacerbations and future hospitalizations when used at night.  Patient will need his advanced settings in conjunction with her current medication regimen.  Failure to have NIV available for use over 24-hour period could lead to death.

## 2018-03-13 NOTE — Progress Notes (Addendum)
Occupational Therapy Treatment Patient Details Name: Sylvia Craig MRN: [id number] DOB: 1951/03/02 Today's Date: 03/13/2018    History of present illness Sylvia Craig is a 67 y.o. female presenting with shortness of breath. PMH is significant for HTN, hypothyroidism, HFmrEF, HLD, [name], insulin-dependent diabetes mellitus, and atrial fibrillation on coumadin.   OT comments  Pt progressing towards OT goals; completing brief functional mobility within room using RW with MinA+2 and seated grooming ADLs with setup assist; lowest SpO2 noted to be 85% on 3L, though quickly rebounding to >90% with rest/deep breathing techniques. Pt continues to present with decreased activity tolerance, fatiguing quickly with room level activity. Feel POC remains appropriate at this time. Will continue to follow acutely to increase pt's activity tolerance and to maximize her safety and independence with ADLs and mobility.   Follow Up Recommendations  No OT follow up;Supervision - Intermittent(continue with HH aide for assist with bathing )    Equipment Recommendations  None recommended by OT(pt has appropriate DME )          Precautions / Restrictions Precautions Precautions: Fall Precaution Comments: High INR Restrictions Weight Bearing Restrictions: No       Mobility Bed Mobility Overal bed mobility: Needs Assistance Bed Mobility: Supine to Sit     Supine to sit: Min assist     General bed mobility comments: min A, HHA to elevate trunk up and scoot forward to EOB  Transfers Overall transfer level: Needs assistance Equipment used: Rolling walker (2 wheeled) Transfers: Sit to/from Stand Sit to Stand: Min assist Stand pivot transfers: Min assist;+2 safety/equipment       General transfer comment: Min A to rise into standing and steady. VC for hand placement.    Balance Overall balance assessment: Needs assistance Sitting-balance support: No upper extremity supported;Feet supported Sitting  balance-Leahy Scale: Fair     Standing balance support: Bilateral upper extremity supported Standing balance-Leahy Scale: Poor Standing balance comment: walker and min guard for static standing                           ADL either performed or assessed with clinical judgement   ADL Overall ADL's : Needs assistance/impaired     Grooming: Set up;Sitting;Wash/dry face               Lower Body Dressing: Moderate assistance;+2 for safety/equipment Lower Body Dressing Details (indicate cue type and reason): assist to don socks this session at bed level              Functional mobility during ADLs: Minimal assistance;+2 for safety/equipment;Rolling walker General ADL Comments: pt completing bed mobility and able to take few steps within room (approx 5') using RW with +2 for safety; pt on 3L O2 during session with lowest sat 85%, quickly rebounds to 90% (approx 30 seconds); pt quickly fatigued with brief activity, completed simple grooming ADLs though declined further ADL completion this session                       Cognition Arousal/Alertness: Awake/alert Behavior During Therapy: WFL for tasks assessed/performed Overall Cognitive Status: Within Functional Limits for tasks assessed  Pertinent Vitals/ Pain       Pain Assessment: No/denies pain                                                          Frequency  Min 2X/week        Progress Toward Goals  OT Goals(current goals can now be found in the care plan section)  Progress towards OT goals: Progressing toward goals  Acute Rehab OT Goals Patient Stated Goal: return home OT Goal Formulation: With patient Time For Goal Achievement: 03/24/18 Potential to Achieve Goals: Good  Plan Discharge plan remains appropriate    Co-evaluation    PT/OT/SLP Co-Evaluation/Treatment: Yes Reason for  Co-Treatment: For patient/therapist safety;To address functional/ADL transfers PT goals addressed during session: Mobility/safety with mobility OT goals addressed during session: ADL's and self-care      AM-PAC PT "6 Clicks" Daily Activity     Outcome Measure   Help from another person eating meals?: None Help from another person taking care of personal grooming?: A Little Help from another person toileting, which includes using toliet, bedpan, or urinal?: A Little Help from another person bathing (including washing, rinsing, drying)?: A Lot Help from another person to put on and taking off regular upper body clothing?: A Little Help from another person to put on and taking off regular lower body clothing?: A Lot 6 Click Score: 17    End of Session Equipment Utilized During Treatment: Rolling walker;Oxygen  OT Visit Diagnosis: Unsteadiness on feet (R26.81);Other abnormalities of gait and mobility (R26.89);Muscle weakness (generalized) (M62.81)   Activity Tolerance Patient tolerated treatment well   Patient Left in chair;with call bell/phone within reach   Nurse Communication Mobility status        Time: [id number] OT Time Calculation (min): 23 min  Charges: OT General Charges $OT Visit: 1 Visit OT Treatments $Therapeutic Activity: 8-22 mins  Marcy SirenBreanna Mical Craig, [place]OT Pager [phone] 03/13/2018    Sylvia PennerBreanna L [name] 03/13/2018, 12:22 PM

## 2018-03-13 NOTE — Progress Notes (Signed)
CRITICAL VALUE ALERT  Critical Value:  PH 7.34   CO2 77.8   Bicarb 40.6  Date & Time Notied:  03/13/18 0750   Provider Notified: Garth BignessKathryn Timberlake, MD  Orders Received/Actions taken: No Action taken at this time

## 2018-03-13 NOTE — Progress Notes (Signed)
PULMONARY / CRITICAL CARE MEDICINE   Name: Sylvia Craig MRN: [id number] DOB: 1951/11/19    ADMISSION DATE:  03/09/2018 CONSULTATION DATE: 03/11/2018  REFERRING MD: Teaching service  CHIEF COMPLAINT: Persistent altered mental status secondary to recurrent hypercarbia  HISTORY OF PRESENT ILLNESS:   67 year old female morbidly obese at 244 pounds, known obstructive sleep apnea noncompliant with CPAP for 5 years.  She was admitted on 03/09/2018 with chief complaints of shortness of breath and increasing oxygen needs.  She is oxygen dependent for several years with O2 at 2-3 L normally.  She notes she has required increasing levels of oxygen but still remains short of breath.  She has extensive past medical history is well-documented and includes OSA, atrial fibrillation on Coumadin, coronary disease, congestive heart failure, diabetes mellitus and morbid obesity.  Following admission she was treated with diuresis and noninvasive mechanical ventilatory support she is noted to have a PCO2 in the 70-80 range and when she was placed on BiPAP she became more awake and interactive.  She was taken off BiPAP and did not wear during the night of 03/10/2018 and again her PCO2 within the 70s and 80s she became lethargic and placed back on BiPAP but now awake alert and following commands.  She needs to be on BiPAP nocturnally every night.  All sedating medications have been discontinued.  She is to be on BiPAP as needed as needed.  Diuresis as tolerated.  She proves refractory to these interventions should be asked to be transferred to the intensive care unit and intubated.  Currently on 03/11/2018 at 1619 hrs. she does not need to be intubated and does not need to go to ICU at this time.  SUBJECTIVE:  No events overnight, tolerating NIV  VITAL SIGNS: BP 122/78 (BP Location: Right Arm)   Pulse 73   Temp 97.7 F (36.5 C) (Oral)   Resp (!) 21   Ht 5\' 5"  (1.651 m)   Wt 250 lb 14.1 oz (113.8 kg)   SpO2 95%    BMI 41.75 kg/m   HEMODYNAMICS:    VENTILATOR SETTINGS: FiO2 (%):  [30 %-35 %] 30 %  INTAKE / OUTPUT: I/O last 3 completed shifts: In: 592 [P.O.:592] Out: 1000 [Urine:1000]  PHYSICAL EXAMINATION: General: Morbidly obese female, sitting up in chair, arousable but reportedly was more arousable in the morning Neuro: Awake alert, moving all ext to command HEENT: Short neck, mild JVD Cardiovascular: RRR, Nl S1/S2 and -M/R/G Lungs: Decreased BS at the bases Abdomen: Obese positive bowel sounds Musculoskeletal: Lower extremities are 2+ Skin: Warm and dry  LABS:  BMET Recent Labs  Lab 03/11/18 0308 03/12/18 0310 03/13/18 0702  NA 139 143 146*  K 5.0 4.3 4.9  CL 97* 97* 101  CO2 32 36* 36*  BUN 73* 66* 55*  CREATININE 2.34* 2.20* 1.76*  GLUCOSE 194* 139* 124*    Electrolytes Recent Labs  Lab 03/11/18 0308 03/12/18 0310 03/13/18 0702  CALCIUM 9.3 9.6 9.5    CBC Recent Labs  Lab 03/11/18 0308 03/12/18 0310 03/13/18 0702  WBC 7.5 7.9 8.1  HGB 11.5* 11.8* 11.2*  HCT 40.2 40.2 38.6  PLT 197 216 213    Coag's Recent Labs  Lab 03/11/18 0308 03/12/18 0310 03/13/18 0702  INR 3.58 2.39 2.18    Sepsis Markers No results for input(s): LATICACIDVEN, PROCALCITON, O2SATVEN in the last 168 hours.  ABG Recent Labs  Lab 03/12/18 0140 03/12/18 2020 03/13/18 0720  PHART 7.370 7.348* 7.338*  PCO2ART 68.2* 75.0* 77.8*  PO2ART 68.5* 68.9* 94.0    Liver Enzymes No results for input(s): AST, ALT, ALKPHOS, BILITOT, ALBUMIN in the last 168 hours.  Cardiac Enzymes Recent Labs  Lab 03/09/18 1943 03/09/18 2353 03/10/18 0621  TROPONINI 0.57* 0.64* 0.48*    Glucose Recent Labs  Lab 03/12/18 1156 03/12/18 1626 03/12/18 2056 03/13/18 0556 03/13/18 1140 03/13/18 1708  GLUCAP 114* 163* 191* 115* 202* 182*    Imaging No results found.   STUDIES:  03/11/2018 2D echo>>  CULTURES:   ANTIBIOTICS:   SIGNIFICANT EVENTS:   LINES/TUBES:  I  reviewed CXR myself, low volumes noted  DISCUSSION: 67 year old female morbidly obese at 244 pounds, known obstructive sleep apnea noncompliant with CPAP for 5 years.  She was admitted on 03/09/2018 with chief complaints of shortness of breath and increasing oxygen needs.  She is oxygen dependent for several years with O2 at 2-3 L normally.  She notes she has required increasing levels of oxygen but still remains short of breath.  She has extensive past medical history is well-documented and includes OSA, atrial fibrillation on Coumadin, coronary disease, congestive heart failure, diabetes mellitus and morbid obesity.  Following admission she was treated with diuresis and noninvasive mechanical ventilatory support she is noted to have a PCO2 in the 70-80 range and when she was placed on BiPAP she became more awake and interactive.   Discussed with PCCM-NP  ASSESSMENT / PLAN:  PULMONARY A: Chronic hypercarbic respiratory failure with acute exacerbation Reported COPD Untreated obstructive sleep apnea P:   Mandatory nocturnal BiPAP and as needed during the day when asleep, failure to do that could result in her death due to worsening chronic hypercarbic respiratory failure D/C all sedating medications Diuresis as tolerated Recommend trilegy machine at home when asleep day or night due to severity of disease Will need PFT when out of the hospital for establishing of baseline obstructive lung disease  CARDIOVASCULAR A:  Chronic congestive heart failure EF noted to be 45-50% Coronary artery disease Hypertension Atrial fibrillation she is followed by headache Katrinka BlazingSmith of cardiology P:  Consider cards consult Diuresis as renal function allows.  RENAL Lab Results  Component Value Date   CREATININE 1.76 (H) 03/13/2018   CREATININE 2.20 (H) 03/12/2018   CREATININE 2.34 (H) 03/11/2018   Recent Labs  Lab 03/11/18 0308 03/12/18 0310 03/13/18 0702  K 5.0 4.3 4.9   Recent Labs  Lab  03/11/18 0308 03/12/18 0310 03/13/18 0702  NA 139 143 146*   A:   Acute renal insufficiency Normally on daily diuretic P:   Could consider diamox but doubt will change acid base status much a this compensation of chronic disease Avoid nephrotoxic  GASTROINTESTINAL Filed Weights   03/11/18 0400 03/12/18 0148 03/13/18 0444  Weight: 244 lb 14.9 oz (111.1 kg) 245 lb 2.4 oz (111.2 kg) 250 lb 14.1 oz (113.8 kg)   A:   Morbid obesity P:   Heart healthy diet  HEMATOLOGIC Recent Labs    03/12/18 0310 03/13/18 0702  HGB 11.8* 11.2*   Lab Results  Component Value Date   INR 2.18 03/13/2018   INR 2.39 03/12/2018   INR 3.58 03/11/2018   A:   Supratherapeutic INR On Coumadin for atrial fibrillation P:  Per primary team  INFECTIOUS A:   No overt infectious process P:   Monitor WBC and fever curve  ENDOCRINE CBG (last 3)  Recent Labs    03/13/18 0556 03/13/18 1140 03/13/18 1708  GLUCAP 115* 202* 182*    A:  Diabetes mellitus 2 P:   Per primary care team  NEUROLOGIC A:   Encephalopathic from hypercarbia that clears with BiPAP P:   RASS goal: 0 Hold all sedating medications  May consider involvement of palliative care service to discuss goals of care.  Please arrange f/u upon discharge with one of our sleep doctors.   Needs PFTs Need Trilegy machine prior to discharge  PCCM will sign off, please call back if needed.  Alyson Reedy, M.D. [facility] Pulmonary/Critical Care Medicine. Pager: [phone]. After hours pager: [phone].  03/13/2018, 5:26 PM

## 2018-03-13 NOTE — Care Management Note (Signed)
Case Management Note Donn PieriniKristi Emaly Boschert RN, BSN Unit 4E-Case Manager [phone]  Patient Details  Name: Venetia NightDebbie Andrzejewski MRN: [id number] Date of Birth: 1951/10/22  Subjective/Objective:  Pt admitted with AKI and Acute resp. failure                  Action/Plan: PTA Pt lived at home - baseline home 02 2-3L, Cpap, - DME order received for home BIPAP at discharge- [name] will qualify for NIV/bipap settings- have left form for MD signature on shadow chart and spoken with [name] regarding form and need for narrative note in chart- she is to come sign form and place note in epic. [name] with [facility] to f/u on NIV/bipap needs for discharge- per PT eval- recommendations for HHPT- will need HH orders placed prior to discharge- CM to follow for continued transition of care needs.   Expected Discharge Date:                  Expected Discharge Plan:  Home w Home Health Services  In-House Referral:     Discharge planning Services  CM Consult  Post Acute Care Choice:  Durable Medical Equipment Choice offered to:     DME Arranged:  NIV, Bipap DME Agency:  [facility]  HH Arranged:    HH Agency:     Status of Service:  In process, will continue to follow  If discussed at Long Length of Stay Meetings, dates discussed:    Discharge Disposition:   Additional Comments:  Darrold SpanWebster, Jakyah Bradby Hall, RN 03/13/2018, 2:14 PM

## 2018-03-13 NOTE — Progress Notes (Signed)
ANTICOAGULATION CONSULT NOTE  Pharmacy Consult for Warfarin Indication: atrial fibrillation  Patient Measurements: Height: 5\' 5"  (165.1 cm) Weight: 250 lb 14.1 oz (113.8 kg) IBW/kg (Calculated) : 57  Labs: Recent Labs    03/11/18 0308 03/12/18 0310 03/13/18 0702  HGB 11.5* 11.8* 11.2*  HCT 40.2 40.2 38.6  PLT 197 216 213  LABPROT 35.4* 25.9* 24.1*  INR 3.58 2.39 2.18  CREATININE 2.34* 2.20* 1.76*   Estimated Creatinine Clearance: 38.5 mL/min (A) (by C-G formula based on SCr of 1.76 mg/dL (H)).  Assessment: Sylvia Craig admitted for AoC respiratory failure, on warfarin PTA for h/o afib. Admit INR 8.28, s/p 2.5mg  IV vitK on 3/14. INR trending down nicely, 2.18 this AM. Pharmacy consulted to resume warfarin on 3/16 - given lower doses so far. CBC stable. No bleeding noted.   Drug/Drug Interaction Potential: Synthroid + Warfarin - may enhance anticoagulant effect of warfarin.  PTA regimen: 3mg  PO daily (admit INR 8.28)  Goal of Therapy:  INR 2-3 Monitor platelets by anticoagulation protocol: Yes   Plan:  Warfarin 2mg  PO x 1 again tonight Daily INR Monitor s/sx of bleeding  Babs BertinHaley Elohim Brune, PharmD, BCPS Clinical Pharmacist Clinical phone for 03/13/2018 until 3:30pm: [phone] If after 3:30pm, please call main pharmacy at: [phone] 03/13/2018 11:00 AM

## 2018-03-13 NOTE — Progress Notes (Signed)
Family Medicine Teaching Service Daily Progress Note Intern Pager: [phone]  Patient name: Sylvia Craig Medical record number: [id number] Date of birth: 10-04-1951 Age: 67 y.o. Gender: female  Primary Care Provider: Lovena Neighboursiallo, [name], MD Consultants: pulm to see 3/16 Code Status: Full  Pt Overview and Major Events to Date:  03/14: Admitted and placed on BiPAP 3/15: MS improved, back to [place]  Assessment and Plan: Sylvia Craig is a 67 y.o. female presenting with shortness of breath. PMH is significant for HTN, hypothyroidism, HFmrEF, HLD, [name], insulin-dependent diabetes mellitus, and atrial fibrillation on coumadin.  Acute on chronic respiratory failure: OHS per pulm.  ABG consistent with acute decompensated hypercarbic respiratory failure yesterday evening. CO2 improved.  -continuous cardiac monitoring -Monitor respiratory status -supplemental O2 PRN: BiPAP this am, now on 3L -continuous pulse ox -consulted pulm Dr. Delton CoombesByrum, appreciate recs  Acute on Chronic HFmrEF: euvolemic volume status today.Last ECHO 08/2017 with EF of 45-50%.Baseline weight appears to be ~250lb during recent office visits, 250 lb today. At home on Metoprolol XL 100 mg daily.  -Continue home Toprol - Daily wts and strict I/O's -restart home lasix of 40mg  PO daily  AKI on CKD, III, improving: SCr is 2.25>2.34>1.76, up from 1.6 in October. Possibly cardiorenal given fluid overload and history of HfmrEF.  - avoid nephrotoxic agents  Elevated troponinCAD: trend 0.36>0.64>0.48. Previous admission with troponin elevated to 1.22. -Continue cardiac monitoring  Atrial fibrillation with RVR: rate controlled on home toprol XL. Supra-therapeutic INR of 8.2 on admission.   -CHADS-VASc is 675 (age, gender, CHF, DM, [name]) -Managed with coudmadin and Toprol at home -Will hold off on anticoagulation given supra-therapeutic INR -Continue Toprol as tolerated  Supratherapeutic INR: home coumadin, INR on admit 8.2 >  >10 > 5.65 > 2.18. S/p vitamin K.  -coumadin per pharm  Pulmonary HTN: PA peak pressure: 62 mm Hg on ECHO 08/2017.  - Has supplemental oxygen at home, baseline 2-3L  Hypothyroidism.  At home on Synthroid 137 mcg.  TSH elevated. -continue 50mcg synthroid 3/15  Insulin-dependent DM: Recent A1c 6.6 on 02/21/2018. home meds are lantus 12U and Humalog 4U TID. CBG's 115, 124 - lantus 6U  - sSSI  Hyperkalemia: Resolved. Serum potassium 5.3> 4.9. -No EKG changes noted - BMP daily  Leg wounds: Bilateral LE's with stigmata of chronic venous stasis -There are some wounds bilaterally with serous weeping and surrounding erythema  - Wound care consulted for recs  Anemia: Hgb 10.9 on admission (BL 10-11). At home on daily ferrous sulfate 325 mg.  In setting of CHF, transfusion threshold would be 8.0.   -monitor with daily CBC   FEN/GI: Heart healthy/ Carb modified Prophylaxis: holding due to supra-therapeutic INR (goal 2-3)   Disposition: continued inpatient stay  Subjective:  Patient sitting up in chair on nasal cannula.  Patient reports that she is feeling better prior to being admitted.  When asked what I could do for her she reports that I just need to lower her CO2 level.  Objective: Temp:  [97.6 F (36.4 C)-98.4 F (36.9 C)] 97.6 F (36.4 C) (03/18 0444) Pulse Rate:  [66-95] 95 (03/18 0444) Resp:  [12-25] 18 (03/18 0444) BP: (106-124)/(52-73) 118/61 (03/18 0444) SpO2:  [91 %-100 %] 95 % (03/18 0444) FiO2 (%):  [30 %] 30 % (03/17 1947) Weight:  [250 lb 14.1 oz (113.8 kg)] 250 lb 14.1 oz (113.8 kg) (03/18 0444) Physical Exam: General: NAD, pleasant, sitting up in chair Eyes: PERRL, EOMI, no conjunctival pallor or injection ENTM: Moist mucous membranes  Cardiovascular: Irregular rhythm, regular rate, no m/r/g, 2+ pitting LE edema Respiratory: CTA BL, normal work of breathing on 3L [place], exam difficult due to body habitus Gastrointestinal: soft, nontender, nondistended,  normoactive BS MSK: moves 4 extremities equally Derm: no rashes appreciated Neuro: CN II-XII grossly intact Psych: Appropriate affect   Laboratory: Recent Labs  Lab 03/10/18 0621 03/11/18 0308 03/12/18 0310  WBC 6.4 7.5 7.9  HGB 10.7* 11.5* 11.8*  HCT 36.5 40.2 40.2  PLT 192 197 216   Recent Labs  Lab 03/10/18 0621 03/11/18 0308 03/12/18 0310  NA 142 139 143  K 4.7 5.0 4.3  CL 99* 97* 97*  CO2 32 32 36*  BUN 65* 73* 66*  CREATININE 2.31* 2.34* 2.20*  CALCIUM 9.2 9.3 9.6  GLUCOSE 133* 194* 139*     Imaging/Diagnostic Tests: ECHO: Study Conclusions: - Left ventricle: The cavity size was normal. Wall thickness was increased in a pattern of moderate LVH. Systolic function was normal. The estimated ejection fraction was in the range of 45% to 50%. - Aortic valve: Trileaflet; normal thickness, mildly calcified   leaflets. - Mitral valve: Mildly thickened, moderately calcified leaflets . - Left atrium: The appendage was severely dilated. - Right ventricle: The cavity size was dilated. Wall thickness was normal. Systolic function was reduced. - Right atrium: The atrium was severely dilated. - Pulmonary arteries: Systolic pressure was moderately increased. PA peak pressure: 52 mm Hg (S).  Dg Chest 2 View  Result Date: 03/09/2018 CLINICAL DATA:  Shortness of breath and chest pain EXAM: CHEST - 2 VIEW COMPARISON:  August 31, 2017 FINDINGS: There is no edema or consolidation. Heart is mildly enlarged with pulmonary vascularity within normal limits. There is aortic atherosclerosis. No adenopathy. There is an old healed fracture of the left clavicle. IMPRESSION: Cardiomegaly.  Aortic atherosclerosis.  No edema or consolidation. Aortic Atherosclerosis (ICD10-I70.0). Electronically Signed   By: Bretta Bang III M.D.   On: 03/09/2018 13:30    Ryana Montecalvo, [place], DO 03/13/2018, 6:48 AM PGY-2, [facility] FPTS Intern pager: [phone], text pages welcome

## 2018-03-13 NOTE — Progress Notes (Signed)
Palliative Medicine consult noted. Due to high referral volume, there may be a delay seeing this patient. Please call the Palliative Medicine Team office at [phone] if recommendations are needed in the interim.  Thank you for inviting us to see this patient.  Margret ChanceMelanie G. Lilia Letterman, RN, BSN, [facility] Palliative Medicine Team 03/13/2018 12:14 PM Office [phone]

## 2018-03-13 NOTE — Progress Notes (Signed)
Physical Therapy Treatment Patient Details Name: Sylvia Craig MRN: [id number] DOB: [birth year] Today's Date: 03/13/2018    History of Present Illness Sylvia Craig is a 67 y.o. female presenting with shortness of breath. PMH is significant for HTN, hypothyroidism, HFmrEF, HLD, [name], insulin-dependent diabetes mellitus, and atrial fibrillation on coumadin.    PT Comments    Pt is showing progress towards goals, however fatigues quickly. She ambulated 10 ft in room with min A +2 for lines and safety. Pt on 3L O2. SpO2 briefly dropped to 85% during ambulation, however quickly returned to >90 with cues for pursed lipped breathing. Plan to continue progressing ambulation. Will continue to follow acutely to maximize pt activity tolerance and functional independence.     Follow Up Recommendations  Home health PT;Supervision for mobility/OOB     Equipment Recommendations  None recommended by PT    Recommendations for Other Services       Precautions / Restrictions Precautions Precautions: Fall Precaution Comments: High INR Restrictions Weight Bearing Restrictions: No    Mobility  Bed Mobility Overal bed mobility: Needs Assistance Bed Mobility: Supine to Sit     Supine to sit: Min assist     General bed mobility comments: min A, HHA to elevate trunk up and scoot forward to EOB  Transfers Overall transfer level: Needs assistance Equipment used: Rolling walker (2 wheeled) Transfers: Sit to/from Stand Sit to Stand: Min assist         General transfer comment: Min A to rise into standing and steady. VC for hand placement.  Ambulation/Gait Ambulation/Gait assistance: Min assist;+2 safety/equipment Ambulation Distance (Feet): 10 Feet Assistive device: Rolling walker (2 wheeled) Gait Pattern/deviations: Step-to pattern;Decreased stride length;Shuffle     General Gait Details: Pt able to take steps in room with chair close behind. On 3L O2. Required seated rest secondary to  fatigue. SpO2 dropped to 85%, however quickly returned to therapeutic levels with pursed lipped breathing.   Stairs            Wheelchair Mobility    Modified Rankin (Stroke Patients Only)       Balance Overall balance assessment: Needs assistance Sitting-balance support: No upper extremity supported;Feet supported Sitting balance-Leahy Scale: Fair     Standing balance support: Bilateral upper extremity supported Standing balance-Leahy Scale: Poor Standing balance comment: walker and min guard for static standing                            Cognition Arousal/Alertness: Awake/alert Behavior During Therapy: WFL for tasks assessed/performed Overall Cognitive Status: Within Functional Limits for tasks assessed                                        Exercises      General Comments        Pertinent Vitals/Pain Pain Assessment: No/denies pain    Home Living                      Prior Function            PT Goals (current goals can now be found in the care plan section) Acute Rehab PT Goals Patient Stated Goal: return home PT Goal Formulation: With patient Time For Goal Achievement: 03/17/18 Potential to Achieve Goals: Good    Frequency    Min 3X/week      PT  Plan Current plan remains appropriate    Co-evaluation PT/OT/SLP Co-Evaluation/Treatment: Yes Reason for Co-Treatment: For patient/therapist safety;To address functional/ADL transfers PT goals addressed during session: Mobility/safety with mobility OT goals addressed during session: ADL's and self-care      AM-PAC PT "6 Clicks" Daily Activity  Outcome Measure  Difficulty turning over in bed (including adjusting bedclothes, sheets and blankets)?: Unable Difficulty moving from lying on back to sitting on the side of the bed? : Unable Difficulty sitting down on and standing up from a chair with arms (e.g., wheelchair, bedside commode, etc,.)?: Unable Help  needed moving to and from a bed to chair (including a wheelchair)?: A Little Help needed walking in hospital room?: A Little Help needed climbing 3-5 steps with a railing? : A Lot 6 Click Score: 11    End of Session Equipment Utilized During Treatment: Gait belt;Oxygen Activity Tolerance: Patient tolerated treatment well Patient left: in chair;with call bell/phone within reach Nurse Communication: Mobility status PT Visit Diagnosis: Unsteadiness on feet (R26.81);Muscle weakness (generalized) (M62.81)     Time: [id number] PT Time Calculation (min) (ACUTE ONLY): 23 min  Charges:  $Gait Training: 8-22 mins                    G Codes:      Kallie LocksHannah Nam Vossler, [place]PTA Pager [phone] Acute Rehab  Sheral ApleyHannah E Anya Murphey 03/13/2018, 10:41 AM

## 2018-03-13 NOTE — Significant Event (Signed)
Rapid Response Event Note  Received call from RN, ABG was ordered and resulted a PaCO2 of 75, the ABG is compensated, per primary team, patient to remain on BIPAP. I did come reassess the patient, patient was drowsy, awoke to voice, follows commands.  BIPAP settings were adjusted by RT. Not in respiratory distress.

## 2018-03-14 ENCOUNTER — Inpatient Hospital Stay (HOSPITAL_COMMUNITY): Payer: Medicare Other

## 2018-03-14 DIAGNOSIS — I5020 Unspecified systolic (congestive) heart failure: Secondary | ICD-10-CM

## 2018-03-14 DIAGNOSIS — N183 Chronic kidney disease, stage 3 (moderate): Secondary | ICD-10-CM

## 2018-03-14 DIAGNOSIS — Z515 Encounter for palliative care: Secondary | ICD-10-CM

## 2018-03-14 LAB — BASIC METABOLIC PANEL
ANION GAP: 10 (ref 5–15)
BUN: 45 mg/dL — AB (ref 6–20)
CO2: 35 mmol/L — ABNORMAL HIGH (ref 22–32)
Calcium: 9.3 mg/dL (ref 8.9–10.3)
Chloride: 99 mmol/L — ABNORMAL LOW (ref 101–111)
Creatinine, Ser: 1.63 mg/dL — ABNORMAL HIGH (ref 0.44–1.00)
GFR calc Af Amer: 36 mL/min — ABNORMAL LOW (ref >60)
GFR, EST NON AFRICAN AMERICAN: 31 mL/min — AB (ref >60)
Glucose, Bld: 155 mg/dL — ABNORMAL HIGH (ref 65–99)
Potassium: 4.6 mmol/L (ref 3.5–5.1)
SODIUM: 144 mmol/L (ref 135–145)

## 2018-03-14 LAB — VITAMIN B12: Vitamin B-12: 1030 pg/mL — ABNORMAL HIGH (ref 180–914)

## 2018-03-14 LAB — CBC
HCT: 41.4 % (ref 36.0–46.0)
Hemoglobin: 11.8 g/dL — ABNORMAL LOW (ref 12.0–15.0)
MCH: 30.6 pg (ref 26.0–34.0)
MCHC: 28.5 g/dL — ABNORMAL LOW (ref 30.0–36.0)
MCV: 107.3 fL — ABNORMAL HIGH (ref 78.0–100.0)
Platelets: 185 10*3/uL (ref 150–400)
RBC: 3.86 MIL/uL — ABNORMAL LOW (ref 3.87–5.11)
RDW: 16 % — AB (ref 11.5–15.5)
WBC: 7.7 10*3/uL (ref 4.0–10.5)

## 2018-03-14 LAB — PULMONARY FUNCTION TEST
FEF 25-75 Pre: 0.3 L/sec
FEF2575-%PRED-PRE: 14 %
FEV1-%Pred-Pre: 18 %
FEV1-Pre: 0.44 L
FEV1FVC-%Pred-Pre: 93 %
FEV6-%Pred-Pre: 20 %
FEV6-Pre: 0.62 L
FEV6FVC-%PRED-PRE: 104 %
FVC-%Pred-Pre: 19 %
FVC-PRE: 0.62 L
PRE FEV6/FVC RATIO: 100 %
Pre FEV1/FVC ratio: 71 %

## 2018-03-14 LAB — BLOOD GAS, ARTERIAL
ACID-BASE EXCESS: 14.3 mmol/L — AB (ref 0.0–2.0)
Bicarbonate: 40.6 mmol/L — ABNORMAL HIGH (ref 20.0–28.0)
DELIVERY SYSTEMS: POSITIVE
Drawn by: 313941
Expiratory PAP: 6
FIO2: 40
Inspiratory PAP: 15
O2 SAT: 97 %
PATIENT TEMPERATURE: 98.6
PCO2 ART: 77.8 mmHg — AB (ref 32.0–48.0)
PH ART: 7.338 — AB (ref 7.350–7.450)
PO2 ART: 94 mmHg (ref 83.0–108.0)

## 2018-03-14 LAB — PROTIME-INR
INR: 2.37
Prothrombin Time: 25.7 seconds — ABNORMAL HIGH (ref 11.4–15.2)

## 2018-03-14 LAB — GLUCOSE, CAPILLARY
GLUCOSE-CAPILLARY: 136 mg/dL — AB (ref 65–99)
GLUCOSE-CAPILLARY: 192 mg/dL — AB (ref 65–99)
GLUCOSE-CAPILLARY: 254 mg/dL — AB (ref 65–99)
Glucose-Capillary: 148 mg/dL — ABNORMAL HIGH (ref 65–99)

## 2018-03-14 MED ORDER — LORAZEPAM 2 MG/ML IJ SOLN: 0.2500 mg | Freq: Two times a day (BID) | INTRAMUSCULAR | Status: DC | PRN

## 2018-03-14 MED ORDER — WARFARIN SODIUM 2 MG PO TABS
2.0000 mg | ORAL_TABLET | Freq: Once | ORAL | Status: AC
  Administered 2018-03-14: 2 mg via ORAL
  Filled 2018-03-14: qty 1

## 2018-03-14 MED ORDER — FUROSEMIDE 40 MG PO TABS
40.0000 mg | ORAL_TABLET | Freq: Every day | ORAL | Status: DC
  Administered 2018-03-14 – 2018-03-16 (×3): 40 mg via ORAL
  Filled 2018-03-14 (×3): qty 1

## 2018-03-14 MED ORDER — LORAZEPAM 2 MG/ML IJ SOLN: 0.2500 mg | Freq: Four times a day (QID) | INTRAMUSCULAR | Status: DC | PRN

## 2018-03-14 MED ORDER — BOOST PO LIQD
237.0000 mL | Freq: Two times a day (BID) | ORAL | Status: DC
  Administered 2018-03-14 – 2018-03-16 (×3): 237 mL via ORAL
  Filled 2018-03-14 (×9): qty 237

## 2018-03-14 MED ORDER — GLUCERNA 1.2 CAL PO LIQD: 1000.0000 mL | ORAL | Status: DC

## 2018-03-14 NOTE — Consult Note (Addendum)
Consultation Note Date: 03/14/2018   Patient Name: Sylvia Craig  DOB: 1951-03-29  MRN: [id number]  Age / Sex: 67 y.o., female  PCP: Zenia Resides, MD Referring Physician: Zenia Resides, MD  Reason for Consultation: Establishing goals of care and Psychosocial/spiritual support  HPI/Patient Profile: 68 y.o. female with past medical history of obstructive sleep apnea/hypoventilation syndrome, obesity, CKD stg 3, CHF/[name], DM, and thyroid disease  who was admitted on 03/09/2018 with altered mental status secondary to hypercarbia (CO2 of 86).  Reportedly Ms. Gowin was not compliant at home with her CPAP.  Her respiratory status in the hospital has remained very tenuous.  She requires bipap each time she sleeps or she quickly becomes hypercarbic again.  Clinical Assessment and Goals of Care:  I have reviewed medical records including EPIC notes, labs and imaging, received report from the care team, assessed the patient and then met at the bedside along with the patient to discuss diagnosis GOC.  After meeting with Sylvia Craig I had a phone conversation with her husband and we have agreed to meet 3/20 at 10:00 am.  I introduced Palliative Medicine as specialized medical care for people living with serious illness. It focuses on providing relief from the symptoms and stress of a serious illness. The goal is to improve quality of life for both the patient and the family.  We discussed a brief life review of the patient. 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  She is currently separated from her husband but they remain good friends.  She lives with her son, Sylvia Craig 68 yo).    As far as functional and nutritional status her albumin is poor and she tells me on a recent admission she had a large amount of weeping from her legs.  She was able to get up and walk 10' on a RW.  Unfortunately even on 3L  of oxygen her oxygen sats dropped to 85% very quickly.  During our meeting their were frequent interruptions.  She mentioned having a panic attack when she heard that she may die.  She was primarily concerned about her 24 yo son.  She wants her husband to help in making important decisions regarding her health.  She does not want her son to face this alone.      Our meeting ended prematurely but we agreed to meet together with her husband tomorrow at 10:00.   Primary Decision Maker:  PATIENT    SUMMARY OF RECOMMENDATIONS    PMT meeting with husband 3/20 at 10:00 am.  Code Status/Advance Care Planning:  Full code  Symptom management Will consider adding low dose anxiolytic for panic attack if she changes her code status and agrees to wear bipap when taking it.  Will discuss 3/20.  Ordered glucerna BID between meals  Prognosis:  Months given acute on chronic respiratory failure with recurrent hypercarbia, obesity, hypoventilation syndrome, CKD stg 3, and CHF (EF 45%), malnutrition.   Discharge Planning: Home with Home Health and palliative  Primary Diagnoses: Present on Admission: . AKI (acute kidney injury) ([place]) . Shortness of breath   I have reviewed the medical record, interviewed the patient and family, and examined the patient. The following aspects are pertinent.  Past Medical History:  Diagnosis Date  . Acute respiratory failure ([place])   . [name] (coronary artery disease), native coronary artery   . CHF (congestive heart failure) ([place])   . CKD (chronic kidney disease), stage III ([place])   . Diabetes mellitus without complication ([place])   . Hyperlipidemia LDL goal <70   . Hypertension   . Iron (Fe) deficiency anemia   . Thyroid disease    Social History   Socioeconomic History  . Marital status: Legally Separated    Spouse name: None  . Number of children: None  . Years of education: None  . Highest education level: None  Social Needs  . Financial  resource strain: None  . Food insecurity - worry: None  . Food insecurity - inability: None  . Transportation needs - medical: None  . Transportation needs - non-medical: None  Occupational History  . None  Tobacco Use  . Smoking status: Former Smoker    Packs/day: 1.00    Types: Cigarettes    Last attempt to quit: 02/24/1988    Years since quitting: 30.0  . Smokeless tobacco: Never Used  Substance and Sexual Activity  . Alcohol use: No  . Drug use: No  . Sexual activity: None  Other Topics Concern  . None  Social History Narrative  . None   Family History  Problem Relation Age of Onset  . Dementia Mother   . [name] Mother   . Heart disease Mother   . Hypertension Mother   . Stroke Mother   . Cancer Father   . Sudden Cardiac Death Neg Hx    Scheduled Meds: . insulin aspart  0-9 Units Subcutaneous TID WC  . insulin glargine  6 Units Subcutaneous QHS  . levothyroxine  150 mcg Oral QAC breakfast  . metoprolol succinate  100 mg Oral Daily  . polyethylene glycol  17 g Oral Daily  . rosuvastatin  10 mg Oral QHS  . warfarin  2 mg Oral ONCE-1800  . Warfarin - Pharmacist Dosing Inpatient   Does not apply q1800   Continuous Infusions: . phytonadione (VITAMIN K) IV 2.5 mg (03/09/18 2319)   PRN Meds:.acetaminophen **OR** acetaminophen Allergies  Allergen Reactions  . Codeine Rash and Other (See Comments)    Edema, also  . Glucosamine Shortness Of Breath  . Other Shortness Of Breath and Other (See Comments)    Crab = chest tightness, also  . Oxycodone Rash and Other (See Comments)    Bad dreams, also  . Shellfish Allergy Shortness Of Breath  . Shellfish-Derived Products Shortness Of Breath  . Atorvastatin Other (See Comments)    Muscle aches  . Broccoli [Brassica Oleracea Italica] Other (See Comments)    Very limited intake due to being on Coumadin  . Lisinopril Other (See Comments) and Cough    "Made me hurt all over," also  . Oxycodone-Aspirin Rash and Other (See  Comments)    Bad dreams, also   Review of Systems denies CP, abdominal pain, anorexia.  Complains of weakness, fatigue, SOB, anxiety.  Physical Exam  Well developed obese female, very pleasant, A&O Quick drifts off to sleep when not actively speaking for short periods.   Vital Signs: BP 118/76 (BP Location: Left Arm)   Pulse 86  Temp 97.8 F (36.6 C) (Oral)   Resp 19   Ht 5' 5"  (1.651 m)   Wt 111.8 kg (246 lb 7.6 oz)   SpO2 97%   BMI 41.02 kg/m  Pain Assessment: 0-10   Pain Score: Asleep   SpO2: SpO2: 97 % O2 Device:SpO2: 97 % O2 Flow Rate: .O2 Flow Rate (L/min): 4 L/min  IO: Intake/output summary:   Intake/Output Summary (Last 24 hours) at 03/14/2018 1030 Last data filed at 03/14/2018 0829 Gross per 24 hour  Intake 1290 ml  Output 650 ml  Net 640 ml    LBM: Last BM Date: 03/08/18 Baseline Weight: Weight: 113.9 kg (251 lb) Most recent weight: Weight: 111.8 kg (246 lb 7.6 oz)     Palliative Assessment/Data: 30%     Time In: 10:30 Time Out: 11:35 Time Total: 65 min. Greater than 50%  of this time was spent counseling and coordinating care related to the above assessment and plan.  Signed by: Florentina Jenny, PA-C Palliative Medicine Pager: [phone]  Please contact Palliative Medicine Team phone at [phone] for questions and concerns.  For individual provider: See Shea Evans

## 2018-03-14 NOTE — Progress Notes (Signed)
Spoke with [name] in the Respiratory department regarding PFT order- per conversation order is still active and is pending completion- pt is on schedule for Thursday AM this week- per [name] that is the earliest that patient can be done, they are aware that pt is pending NIV approval for home and d/c is pending the NIV approval. They will try to complete sooner if there are any cancellations or openings that come up in the schedule.

## 2018-03-14 NOTE — Care Management Important Message (Signed)
Important Message  Patient Details  Name: Sylvia Craig MRN: [id number] Date of Birth: April 19, 1951   Medicare Important Message Given:  Yes    [name] 03/14/2018, 3:35 PM

## 2018-03-14 NOTE — Progress Notes (Signed)
Pt is on NIV QHS at this time tolerating it well. Patient has some redness on nose from mask.

## 2018-03-14 NOTE — Discharge Instructions (Signed)
Your appointment with Dr. Sydnee Cabal is scheduled for 1:30 pm on March 26. Please arrive 15 mintues early.   Information on my medicine - Coumadin   (Warfarin)  This medication education was reviewed with me or my healthcare representative as part of my discharge preparation.  The pharmacist that spoke with me during my hospital stay was:  Chinita Greenland, [facility]  Why was Coumadin prescribed for you? Coumadin was prescribed for you because you have a blood clot or a medical condition that can cause an increased risk of forming blood clots. Blood clots can cause serious health problems by blocking the flow of blood to the heart, lung, or brain. Coumadin can prevent harmful blood clots from forming. As a reminder your indication for Coumadin is:   Select from menu  What test will check on my response to Coumadin? While on Coumadin (warfarin) you will need to have an INR test regularly to ensure that your dose is keeping you in the desired range. The INR (international normalized ratio) number is calculated from the result of the laboratory test called prothrombin time (PT).  If an INR APPOINTMENT HAS NOT ALREADY BEEN MADE FOR YOU please schedule an appointment to have this lab work done by your health care provider within 7 days. Your INR goal is usually a number between:  2 to 3 or your provider may give you a more narrow range like 2-2.5.  Ask your health care provider during an office visit what your goal INR is.  What  do you need to  know  About  COUMADIN? Take Coumadin (warfarin) exactly as prescribed by your healthcare provider about the same time each day.  DO NOT stop taking without talking to the doctor who prescribed the medication.  Stopping without other blood clot prevention medication to take the place of Coumadin may increase your risk of developing a new clot or stroke.  Get refills before you run out.  What do you do if you miss a dose? If you miss a dose, take it as soon as you  remember on the same day then continue your regularly scheduled regimen the next day.  Do not take two doses of Coumadin at the same time.  Important Safety Information A possible side effect of Coumadin (Warfarin) is an increased risk of bleeding. You should call your healthcare provider right away if you experience any of the following: ? Bleeding from an injury or your nose that does not stop. ? Unusual colored urine (red or dark brown) or unusual colored stools (red or black). ? Unusual bruising for unknown reasons. ? A serious fall or if you hit your head (even if there is no bleeding).  Some foods or medicines interact with Coumadin (warfarin) and might alter your response to warfarin. To help avoid this: ? Eat a balanced diet, maintaining a consistent amount of Vitamin K. ? Notify your provider about major diet changes you plan to make. ? Avoid alcohol or limit your intake to 1 drink for women and 2 drinks for men per day. (1 drink is 5 oz. wine, 12 oz. beer, or 1.5 oz. liquor.)  Make sure that ANY health care provider who prescribes medication for you knows that you are taking Coumadin (warfarin).  Also make sure the healthcare provider who is monitoring your Coumadin knows when you have started a new medication including herbals and non-prescription products.  Coumadin (Warfarin)  Major Drug Interactions  Increased Warfarin Effect Decreased Warfarin Effect  Alcohol (  large quantities) Antibiotics (esp. Septra/Bactrim, Flagyl, Cipro) Amiodarone (Cordarone) Aspirin (ASA) Cimetidine (Tagamet) Megestrol (Megace) NSAIDs (ibuprofen, naproxen, etc.) Piroxicam (Feldene) Propafenone (Rythmol SR) Propranolol (Inderal) Isoniazid (INH) Posaconazole (Noxafil) Barbiturates (Phenobarbital) Carbamazepine (Tegretol) Chlordiazepoxide (Librium) Cholestyramine (Questran) Griseofulvin Oral Contraceptives Rifampin Sucralfate (Carafate) Vitamin K   Coumadin (Warfarin) Major Herbal  Interactions  Increased Warfarin Effect Decreased Warfarin Effect  Garlic Ginseng Ginkgo biloba Coenzyme Q10 Green tea St. Johns wort    Coumadin (Warfarin) FOOD Interactions  Eat a consistent number of servings per week of foods HIGH in Vitamin K (1 serving =  cup)  Collards (cooked, or boiled & drained) Kale (cooked, or boiled & drained) Mustard greens (cooked, or boiled & drained) Parsley *serving size only =  cup Spinach (cooked, or boiled & drained) Swiss chard (cooked, or boiled & drained) Turnip greens (cooked, or boiled & drained)  Eat a consistent number of servings per week of foods MEDIUM-HIGH in Vitamin K (1 serving = 1 cup)  Asparagus (cooked, or boiled & drained) Broccoli (cooked, boiled & drained, or raw & chopped) Brussel sprouts (cooked, or boiled & drained) *serving size only =  cup Lettuce, raw (green leaf, endive, romaine) Spinach, raw Turnip greens, raw & chopped   These websites have more information on Coumadin (warfarin):  http://www.king-russell.com/www.coumadin.com; https://www.hines.net/www.ahrq.gov/consumer/coumadin.htm;

## 2018-03-14 NOTE — Care Management Note (Signed)
Case Management Note Donn PieriniKristi Wyeth Hoffer RN, BSN Unit 4E-Case Manager [phone]  Patient Details  Name: Sylvia NightDebbie Craig MRN: [id number] Date of Birth: 12-18-1951  Subjective/Objective:  Pt admitted with AKI and Acute resp. failure                  Action/Plan: PTA Pt lived at home - baseline home 02 2-3L, Cpap, - DME order received for home BIPAP at discharge- [name] will [facility] for [facility]/bipap settings- have left form for MD signature on shadow chart and spoken with [name] regarding form and need for narrative note in chart- she is to come sign form and place note in epic. [name] with [facility] to f/u on NIV/bipap needs for discharge- per PT eval- recommendations for HHPT- will need HH orders placed prior to discharge- CM to follow for continued transition of care needs.   Expected Discharge Date:                  Expected Discharge Plan:  Home w Home Health Services  In-House Referral:     Discharge planning Services  CM Consult  Post Acute Care Choice:  Durable Medical Equipment, Home Health, Resumption of Svcs/PTA Provider Choice offered to:  Patient  DME Arranged:  NIV, Bipap DME Agency:  [facility]  HH Arranged:  RN, PT, Nurse's Aide HH Agency:  [facility]  Status of Service:  In process, will continue to follow  If discussed at Long Length of Stay Meetings, dates discussed:    Discharge Disposition: home/home health   Additional Comments:  03/14/18- 1130- [name] RN, CM- PFT ordered yesterday- have not been completed yet- call made to Resp. Dept- spoke with [name]- per conversation order is still active-pt is on schedule for Thur.-discussed with [name] that pt is pending NIV approval for d/c home pending the completion of PFT testing-they will try to complete earlier if possible. - Spoke with pt at bedside- per conversation [facility]- does not know where her cpap is, other  DME at home includes walker, shower bench and grab bars. Pt is home with son who will provide transportation at discharge. [facility] also [facility]- would like to have some home team if possible - will notify [facility] that pt has resumption orders with HHPT added- [facility](HH team lead is Counselling psychologistcott RN)    [name], [name], RN 03/14/2018, 2:55 PM

## 2018-03-14 NOTE — Progress Notes (Signed)
ANTICOAGULATION CONSULT NOTE  Pharmacy Consult for Warfarin Indication: atrial fibrillation  Patient Measurements: Height: 5\' 5"  (165.1 cm) Weight: 246 lb 7.6 oz (111.8 kg) IBW/kg (Calculated) : 57  Labs: Recent Labs    03/12/18 0310 03/13/18 0702 03/14/18 0328  HGB 11.8* 11.2* 11.8*  HCT 40.2 38.6 41.4  PLT 216 213 185  LABPROT 25.9* 24.1* 25.7*  INR 2.39 2.18 2.37  CREATININE 2.20* 1.76* 1.63*   Estimated Creatinine Clearance: 41.1 mL/min (A) (by C-G formula based on SCr of 1.63 mg/dL (H)).  Assessment: [age] yoF admitted for AoC respiratory failure, on warfarin PTA for h/o afib. Admit INR 8.28, s/p 2.5mg  IV vitK on 3/14. INR trended down, remains in therapeutic range. Pharmacy consulted to resume warfarin on 3/16 - given lower doses so far. CBC stable. No bleeding noted.   Drug/Drug Interaction Potential: Synthroid + Warfarin - may enhance anticoagulant effect of warfarin.  PTA regimen: 3mg  PO daily (admit INR 8.28)  Goal of Therapy:  INR 2-3 Monitor platelets by anticoagulation protocol: Yes   Plan:  Warfarin 2mg  PO x 1 again tonight Daily INR Monitor s/sx of bleeding  Babs BertinHaley Heidy Mccubbin, PharmD, BCPS Clinical Pharmacist Clinical phone for 03/14/2018 until 3:30pm: [phone] If after 3:30pm, please call main pharmacy at: [phone] 03/14/2018 9:22 AM

## 2018-03-14 NOTE — Progress Notes (Signed)
Physical Therapy Treatment Patient Details Name: Sylvia Craig MRN: [id number] DOB: [birth year] Today's Date: 03/14/2018    History of Present Illness Sylvia Craig is a 67 y.o. female presenting with shortness of breath. PMH is significant for HTN, hypothyroidism, HFmrEF, HLD, [name], insulin-dependent diabetes mellitus, and atrial fibrillation on coumadin.    PT Comments    Patient received in bed, pleasant and willing to participate in skilled PT session today. Her mobility is improving, and she was able to complete all functional bed mobility, transfers, and gait with Min guard and extended time, cues for safety today. Also worked on functional strengthening exercises in the chair. NT present and assisted in replacing purewick after transfer to chair to prevent wetting of linens/to maintain skin integrity. She was left up in the chair with all needs met this afternoon.   Follow Up Recommendations  Home health PT;Supervision for mobility/OOB     Equipment Recommendations  None recommended by PT    Recommendations for Other Services       Precautions / Restrictions Precautions Precautions: Fall Precaution Comments: High INR Restrictions Weight Bearing Restrictions: No    Mobility  Bed Mobility Overal bed mobility: Needs Assistance Bed Mobility: Supine to Sit     Supine to sit: Min guard     General bed mobility comments: Min guard, extended time, cues for safety   Transfers Overall transfer level: Needs assistance Equipment used: Rolling walker (2 wheeled) Transfers: Sit to/from Stand Sit to Stand: Min guard         General transfer comment: Min guard and VC for safety and sequencing   Ambulation/Gait Ambulation/Gait assistance: Min guard Ambulation Distance (Feet): 5 Feet Assistive device: Rolling walker (2 wheeled) Gait Pattern/deviations: Step-to pattern;Decreased stride length;Shuffle     General Gait Details: gait continues to be limited due to fatigue, SpO2  remained WNL today however    Stairs            Wheelchair Mobility    Modified Rankin (Stroke Patients Only)       Balance Overall balance assessment: Needs assistance Sitting-balance support: No upper extremity supported;Feet supported Sitting balance-Leahy Scale: Fair     Standing balance support: Bilateral upper extremity supported Standing balance-Leahy Scale: Poor                              Cognition Arousal/Alertness: Awake/alert Behavior During Therapy: WFL for tasks assessed/performed Overall Cognitive Status: Within Functional Limits for tasks assessed                                        Exercises General Exercises - Lower Extremity Ankle Circles/Pumps: Both;20 reps;Seated Long Arc Quad: Both;10 reps;Seated(manual resistance from PT ) Hip ABduction/ADduction: Both;10 reps;Seated Hip Flexion/Marching: Both;10 reps;Seated    General Comments        Pertinent Vitals/Pain Pain Assessment: No/denies pain    Home Living                      Prior Function            PT Goals (current goals can now be found in the care plan section) Acute Rehab PT Goals Patient Stated Goal: return home PT Goal Formulation: With patient Time For Goal Achievement: 03/17/18 Potential to Achieve Goals: Good Progress towards PT goals: Progressing toward goals  Frequency    Min 3X/week      PT Plan Current plan remains appropriate    Co-evaluation              AM-PAC PT "6 Clicks" Daily Activity  Outcome Measure  Difficulty turning over in bed (including adjusting bedclothes, sheets and blankets)?: Unable Difficulty moving from lying on back to sitting on the side of the bed? : Unable Difficulty sitting down on and standing up from a chair with arms (e.g., wheelchair, bedside commode, etc,.)?: Unable Help needed moving to and from a bed to chair (including a wheelchair)?: A Little Help needed walking in  hospital room?: A Little Help needed climbing 3-5 steps with a railing? : A Lot 6 Click Score: 11    End of Session Equipment Utilized During Treatment: Gait belt;Oxygen Activity Tolerance: Patient tolerated treatment well Patient left: in chair;with call bell/phone within reach   PT Visit Diagnosis: Unsteadiness on feet (R26.81);Muscle weakness (generalized) (M62.81)     Time: 1330-1350 PT Time Calculation (min) (ACUTE ONLY): 20 min  Charges:  $Therapeutic Activity: 8-22 mins                    G Codes:       Deniece Ree PT, DPT, CBIS  Supplemental Physical Therapist [place]   Pager [phone]

## 2018-03-14 NOTE — Progress Notes (Signed)
PT Cancellation Note  Patient Details Name: Sylvia Craig MRN: [id number] DOB: 1951-07-28   Cancelled Treatment:    Reason Eval/Treat Not Completed: Patient at procedure or test/unavailable Attempted to work with patient today, RN reports that patient is currently occupied with palliative and asks that PT return later in day. Plan to attempt to return as/if able and schedule allows.    Nedra Hai PT, DPT, CBIS  Supplemental Physical Therapist [facility]   Pager [phone]

## 2018-03-14 NOTE — Progress Notes (Signed)
Family Medicine Teaching Service Daily Progress Note Intern Pager: [phone]  Patient name: Sylvia Craig Medical record number: [id number] Date of birth: 1951-02-24 Age: 67 y.o. Gender: female  Primary Care Provider: Moses Manners, MD Consultants: pulm to see 3/16 Code Status: Full  Pt Overview and Major Events to Date:  03/14: Admitted and placed on BiPAP 3/15: MS improved, back to [place]  Assessment and Plan: Sylvia Craig is a 67 y.o. female presenting with shortness of breath. PMH is significant for HTN, hypothyroidism, HFmrEF, HLD, [name], insulin-dependent diabetes mellitus, and atrial fibrillation on coumadin.  Acute on chronic respiratory failure: OHS per pulm. Patient will require BiPAP nightly and intermittently during day after discharge. -continuous cardiac monitoring -Monitor respiratory status -supplemental O2 PRN: BiPAP qhs, 2-3L daily -continuous pulse ox -consulted pulm, appreciate recs  Acute on Chronic HFmrEF:Last ECHO 08/2017 with EF of 45-50%.Baseline weight appears to be ~250lb during recent office visits, 246 lb today. At home on Metoprolol XL 100 mg daily.  -Continue home Toprol - Daily wts and strict I/O's -restart home lasix of 40mg  PO daily  AKI on CKD, III, resolving AKI: SCr is 2.25>1.76>1.63 (baseline), 1.6 in October.   - avoid nephrotoxic agents  Elevated troponinCAD: trend 0.36>0.64>0.48.  -Continue cardiac monitoring  Atrial fibrillation: rate controlled on home toprol XL. Supra-therapeutic INR of 8.2 on admission. Goal INR 2-3.    -CHADS-VASc is 17 (age, gender, CHF, DM, [name]) -Managed with coudmadin and Toprol at home -Coumadin per pharmacy -Continue Toprol as tolerated  Supratherapeutic INR, resolved: home coumadin, INR on admit 8.2 > >10 > 5.65 > 2.37. S/p vitamin K x1.  -coumadin per pharm  Pulmonary HTN: PA peak pressure: 62 mm Hg on ECHO 08/2017.  - Has supplemental oxygen at home, baseline 2-3L  Hypothyroidism: chronic,  stable. Home Synthroid 137 mcg.  TSH elevated. -continue synthroid  Insulin-dependent DM: Recent A1c 6.6 on 02/21/2018. home meds are lantus 12U and Humalog 4U TID. CBG's 155, 136 - lantus 6U  - sSSI  Leg wounds: Bilateral LE's with stigmata of chronic venous stasis -There are some wounds bilaterally with serous weeping and surrounding erythema  - Wound care consulted for recs  Anemia: Improving. Hgb 11.8 today. (BL 10-11). At home on daily ferrous sulfate 325 mg.  In setting of CHF, transfusion threshold would be 8.0.   -monitor with daily CBC   FEN/GI: Heart healthy/ Carb modified Prophylaxis: Coumadin per pharmacy  Disposition: continued inpatient stay  Subjective:  Patient with no problems this am. Reporting that her breathing is better with BiPAP. She reports that years ago she was given CPAP, but she does not know if she moved with it to [facility], so when she had repeat sleep study and they said she did not need CPAP 6 months ago, she did not worry about finding it to wear it.   Objective: Temp:  [97.6 F (36.4 C)-97.8 F (36.6 C)] 97.8 F (36.6 C) (03/19 0800) Pulse Rate:  [73-98] 86 (03/19 0800) Resp:  [14-23] 19 (03/19 0800) BP: (101-131)/(52-95) 118/76 (03/19 0800) SpO2:  [96 %-100 %] 97 % (03/19 0800) FiO2 (%):  [30 %] 30 % (03/18 2355) Weight:  [246 lb 7.6 oz (111.8 kg)] 246 lb 7.6 oz (111.8 kg) (03/19 0417) Physical Exam: General: NAD, pleasant, bandage on nose from BiPAP Eyes: PERRL, EOMI, no conjunctival pallor or injection ENTM: Moist mucous membranes Cardiovascular: RRR, no m/r/g, 1+ pitting LE edema Respiratory: CTA BL, normal work of breathing on 3L, exam limited due to  body habitus Gastrointestinal: soft, nontender, nondistended MSK: moves 4 extremities equally Derm: no rashes appreciated Neuro: CN II-XII grossly intact Psych: AOx3, appropriate affect  Laboratory: Recent Labs  Lab 03/12/18 0310 03/13/18 0702 03/14/18 0328  WBC 7.9  8.1 7.7  HGB 11.8* 11.2* 11.8*  HCT 40.2 38.6 41.4  PLT 216 213 185   Recent Labs  Lab 03/12/18 0310 03/13/18 0702 03/14/18 0328  NA 143 146* 144  K 4.3 4.9 4.6  CL 97* 101 99*  CO2 36* 36* 35*  BUN 66* 55* 45*  CREATININE 2.20* 1.76* 1.63*  CALCIUM 9.6 9.5 9.3  GLUCOSE 139* 124* 155*     Imaging/Diagnostic Tests: ECHO: Study Conclusions: - Left ventricle: The cavity size was normal. Wall thickness was increased in a pattern of moderate LVH. Systolic function was normal. The estimated ejection fraction was in the range of 45% to 50%. - Aortic valve: Trileaflet; normal thickness, mildly calcified   leaflets. - Mitral valve: Mildly thickened, moderately calcified leaflets . - Left atrium: The appendage was severely dilated. - Right ventricle: The cavity size was dilated. Wall thickness was normal. Systolic function was reduced. - Right atrium: The atrium was severely dilated. - Pulmonary arteries: Systolic pressure was moderately increased. PA peak pressure: 52 mm Hg (S).  Dg Chest 2 View  Result Date: 03/09/2018 CLINICAL DATA:  Shortness of breath and chest pain EXAM: CHEST - 2 VIEW COMPARISON:  August 31, 2017 FINDINGS: There is no edema or consolidation. Heart is mildly enlarged with pulmonary vascularity within normal limits. There is aortic atherosclerosis. No adenopathy. There is an old healed fracture of the left clavicle. IMPRESSION: Cardiomegaly.  Aortic atherosclerosis.  No edema or consolidation. Aortic Atherosclerosis (ICD10-I70.0). Electronically Signed   By: Bretta BangWilliam  Woodruff III M.D.   On: 03/09/2018 13:30    Willford Rabideau, [place], DO 03/14/2018, 9:32 AM PGY-1, [facility] FPTS Intern pager: [phone], text pages welcome

## 2018-03-15 DIAGNOSIS — Z7189 Other specified counseling: Secondary | ICD-10-CM

## 2018-03-15 LAB — BASIC METABOLIC PANEL
Anion gap: 10 (ref 5–15)
BUN: 49 mg/dL — ABNORMAL HIGH (ref 6–20)
CHLORIDE: 97 mmol/L — AB (ref 101–111)
CO2: 34 mmol/L — ABNORMAL HIGH (ref 22–32)
Calcium: 9 mg/dL (ref 8.9–10.3)
Creatinine, Ser: 1.75 mg/dL — ABNORMAL HIGH (ref 0.44–1.00)
GFR calc Af Amer: 33 mL/min — ABNORMAL LOW (ref >60)
GFR calc non Af Amer: 29 mL/min — ABNORMAL LOW (ref >60)
GLUCOSE: 207 mg/dL — AB (ref 65–99)
POTASSIUM: 5.6 mmol/L — AB (ref 3.5–5.1)
Sodium: 141 mmol/L (ref 135–145)

## 2018-03-15 LAB — POTASSIUM: POTASSIUM: 4.5 mmol/L (ref 3.5–5.1)

## 2018-03-15 LAB — CBC
HEMATOCRIT: 40.7 % (ref 36.0–46.0)
Hemoglobin: 11.6 g/dL — ABNORMAL LOW (ref 12.0–15.0)
MCH: 30.8 pg (ref 26.0–34.0)
MCHC: 28.5 g/dL — AB (ref 30.0–36.0)
MCV: 108 fL — AB (ref 78.0–100.0)
Platelets: 154 10*3/uL (ref 150–400)
RBC: 3.77 MIL/uL — ABNORMAL LOW (ref 3.87–5.11)
RDW: 15.6 % — AB (ref 11.5–15.5)
WBC: 6.2 10*3/uL (ref 4.0–10.5)

## 2018-03-15 LAB — ALBUMIN: ALBUMIN: 2.8 g/dL — AB (ref 3.5–5.0)

## 2018-03-15 LAB — PROTIME-INR
INR: 2.49
Prothrombin Time: 26.7 seconds — ABNORMAL HIGH (ref 11.4–15.2)

## 2018-03-15 LAB — GLUCOSE, CAPILLARY
GLUCOSE-CAPILLARY: 144 mg/dL — AB (ref 65–99)
GLUCOSE-CAPILLARY: 239 mg/dL — AB (ref 65–99)
GLUCOSE-CAPILLARY: 124 mg/dL — AB (ref 65–99)
Glucose-Capillary: 168 mg/dL — ABNORMAL HIGH (ref 65–99)

## 2018-03-15 LAB — FOLATE RBC
Folate, Hemolysate: >620 ng/mL
Folate, RBC: >1756 ng/mL (ref >498)
Hematocrit: 35.3 % (ref 34.0–46.6)

## 2018-03-15 MED ORDER — WARFARIN SODIUM 2 MG PO TABS
2.0000 mg | ORAL_TABLET | Freq: Once | ORAL | Status: AC
Start: 2018-03-15 — End: 2018-03-15
  Administered 2018-03-15: 2 mg via ORAL
  Filled 2018-03-15: qty 1

## 2018-03-15 MED ORDER — FUROSEMIDE 10 MG/ML IJ SOLN
40.0000 mg | Freq: Once | INTRAMUSCULAR | Status: AC
  Administered 2018-03-15: 40 mg via INTRAVENOUS
  Filled 2018-03-15: qty 4

## 2018-03-15 NOTE — Progress Notes (Signed)
Occupational Therapy Treatment Patient Details Name: Sylvia Craig MRN: [id number] DOB: 03/28/1951 Today's Date: 03/15/2018    History of present illness Sylvia Craig is a 66 y.o. female presenting with shortness of breath. PMH is significant for HTN, hypothyroidism, HFmrEF, HLD, [name], insulin-dependent diabetes mellitus, and atrial fibrillation on coumadin.   OT comments  Pt progressing towards OT goals this session, min A for transfers, and what I anticipate as close to baseline for ADL as far as ability but with decreased activity tolerance - education provided for energy conservation. 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 OT will continue to follow acutely.    Follow Up Recommendations  No OT follow up;Supervision - Intermittent(continue with HH Aide)    Equipment Recommendations  None recommended by OT(Pt has appropriate DME)    Recommendations for Other Services      Precautions / Restrictions Precautions Precautions: Fall Restrictions Weight Bearing Restrictions: No       Mobility Bed Mobility Overal bed mobility: Needs Assistance Bed Mobility: Supine to Sit     Supine to sit: Min guard     General bed mobility comments: Min guard, extended time, cues for safety   Transfers Overall transfer level: Needs assistance Equipment used: Rolling walker (2 wheeled) Transfers: Sit to/from Stand Sit to Stand: Min guard         General transfer comment: Min guard and VC for safety and sequencing     Balance Overall balance assessment: Needs assistance Sitting-balance support: No upper extremity supported;Feet supported Sitting balance-Leahy Scale: Fair     Standing balance support: Bilateral upper extremity supported Standing balance-Leahy Scale: Poor Standing balance comment: walker and min guard for static standing                            ADL either performed or assessed with clinical judgement   ADL Overall ADL's : Needs assistance/impaired     Grooming: Wash/dry hands;Oral care;Set up;Sitting Grooming Details (indicate cue type and reason): in recliner                 Toilet Transfer: Minimal assistance;+2 for safety/equipment;Stand-pivot;BSC;Requires wide/bariatric   Toileting- Clothing Manipulation and Hygiene: Maximal assistance;Sit to/from stand Toileting - Clothing Manipulation Details (indicate cue type and reason): due to incontinence, Pt wears adult diapers at home     Functional mobility during ADLs: Minimal assistance;+2 for safety/equipment;Rolling walker General ADL Comments: Fatigue impacts ability to perform ADL - verbally reviewed energy conservation education with Pt     Vision       Perception     Praxis      Cognition Arousal/Alertness: Awake/alert Behavior During Therapy: WFL for tasks assessed/performed Overall Cognitive Status: Within Functional Limits for tasks assessed                                          Exercises     Shoulder Instructions       General Comments      Pertinent Vitals/ Pain       Pain Assessment: No/denies pain  Home Living  Prior Functioning/Environment              Frequency  Min 2X/week        Progress Toward Goals  OT Goals(current goals can now be found in the care plan section)  Progress towards OT goals: Progressing toward goals  Acute Rehab OT Goals Patient Stated Goal: return home OT Goal Formulation: With patient Time For Goal Achievement: 03/24/18 Potential to Achieve Goals: Good  Plan Discharge plan remains appropriate    Co-evaluation                 AM-PAC PT "6 Clicks" Daily Activity     Outcome Measure   Help from another person eating meals?: None Help from another person taking care of personal grooming?: A  Little Help from another person toileting, which includes using toliet, bedpan, or urinal?: A Little Help from another person bathing (including washing, rinsing, drying)?: A Lot Help from another person to put on and taking off regular upper body clothing?: A Little Help from another person to put on and taking off regular lower body clothing?: A Lot 6 Click Score: 17    End of Session Equipment Utilized During Treatment: Rolling walker;Oxygen  OT Visit Diagnosis: Unsteadiness on feet (R26.81);Other abnormalities of gait and mobility (R26.89);Muscle weakness (generalized) (M62.81)   Activity Tolerance Patient tolerated treatment well   Patient Left in chair;with call bell/phone within reach   Nurse Communication Mobility status        Time: [id number] OT Time Calculation (min): 21 min  Charges: OT General Charges $OT Visit: 1 Visit OT Treatments $Self Care/Home Management : 8-22 mins  Sherryl MangesLaura Clary Meeker OTR/L [phone]   Evern BioLaura J Ashutosh Dieguez 03/15/2018, 6:15 PM

## 2018-03-15 NOTE — Progress Notes (Signed)
Chaplain came to pt.'s room to fulfill Advanced Directive request. Advanced Directive has been completed and duly witnessed and notarized. A copy was placed in the Pt.'s chart and the original and copies placed in the patient's room.

## 2018-03-15 NOTE — Progress Notes (Signed)
Inpatient Diabetes Program Recommendations  AACE/ADA: New Consensus Statement on Inpatient Glycemic Control (2015)  Target Ranges:  Prepandial:   less than 140 mg/dL      Peak postprandial:   less than 180 mg/dL (1-2 hours)      Critically ill patients:  140 - 180 mg/dL   Lab Results  Component Value Date   GLUCAP 144 (H) 03/15/2018   HGBA1C 6.6 02/21/2018    Review of Glycemic Control Results for Sylvia Craig, [name] (MRN [id number]) as of 03/15/2018 09:41  Ref. Range 03/14/2018 16:09 03/14/2018 21:41 03/15/2018 06:11  Glucose-Capillary Latest Ref Range: 65 - 99 mg/dL 409192 (H) 811254 (H) 914144 (H)   Diabetes history: Type 2 DM Outpatient Diabetes medications: Novolog 4-6 units TID, Lantus 12 units QHS Current orders for Inpatient glycemic control: Novolog 0-9 units TID, Lantus 6 units QHS  Inpatient Diabetes Program Recommendations:    Consider adding Novolog 0-5 units QHS.  Thanks, Lujean RaveLauren Arwyn Besaw, MSN, RNC-OB Diabetes Coordinator [phone] (8a-5p)

## 2018-03-15 NOTE — Progress Notes (Signed)
Family Medicine Teaching Service Daily Progress Note Intern Pager: [phone]  Patient name: Sylvia Craig Medical record number: [id number] Date of birth: 17-Feb-1951 Age: 67 y.o. Gender: female  Primary Care Provider: Moses MannersHensel, William A, MD Consultants: pulm to see 3/16 Code Status: Full  Pt Overview and Major Events to Date:  03/14: Admitted and placed on BiPAP 3/15: MS improved, back to [facility]  Assessment and Plan: Sylvia Craig is a 67 y.o. female presenting with shortness of breath. PMH is significant for HTN, hypothyroidism, HFmrEF, HLD, [name], insulin-dependent diabetes mellitus, and atrial fibrillation on coumadin.  Acute on chronic respiratory failure: OHS per pulm. Patient will require BiPAP nightly and intermittently during day after discharge. -continuous cardiac monitoring -Monitor respiratory status -supplemental O2 PRN: BiPAP qhs, 2-3L daily, increased to 4L yesterday -continuous pulse ox -consulted pulm, appreciate recs  Acute on Chronic HFmrEF:Last ECHO 08/2017 with EF of 45-50%.Baseline weight appears to be ~250lb during recent office visits, 255 lb today. At home on Metoprolol XL 100 mg daily.  -Continue home Toprol - Daily wts and strict I/O's, only 650 mL output yesterday -restarted home lasix of 40mg  PO daily  AKI on CKD, III, resolving AKI: SCr is 2.25>1.76>1.75 (baseline 1.6).   - avoid nephrotoxic agents  Atrial fibrillation: rate controlled on home toprol XL. Supra-therapeutic INR of 8.2 on admission. Goal INR 2-3.    -CHADS-VASc is 1105 (age, gender, CHF, DM, [name]) -Managed with coudmadin and Toprol at home -Coumadin per pharmacy -Continue Toprol as tolerated  Supratherapeutic INR, resolved: home coumadin, INR on admit 8.2 > >10 > 5.65 > 2.37. S/p vitamin K x1.  -coumadin per pharm  Pulmonary HTN: PA peak pressure: 62 mm Hg on ECHO 08/2017.  - Has supplemental oxygen at home, baseline 2-3L  Hypothyroidism: chronic, stable. Home Synthroid 137 mcg.   TSH elevated. -continue 150mcg synthroid  Insulin-dependent DM: Recent A1c 6.6 on 02/21/2018. home meds are lantus 12U and Humalog 4U TID. CBG's 207, 144 - lantus 6U  - sSSI  Leg wounds: Bilateral LE's with stigmata of chronic venous stasis -There are some wounds bilaterally with serous weeping and surrounding erythema  - Wound care consulted for recs  Anemia: Improving. Hgb 11.8 today. (BL 10-11). At home on daily ferrous sulfate 325 mg.  In setting of CHF, transfusion threshold would be 8.0.   -monitor with daily CBC   FEN/GI: Heart healthy/ Carb modified Prophylaxis: Coumadin per pharmacy  Disposition: continued inpatient stay  Subjective:  Patient is feeling better today. Motivated to lose weight and change her life.  Objective: Temp:  [96.1 F (35.6 C)-97.8 F (36.6 C)] 96.1 F (35.6 C) (03/20 0339) Pulse Rate:  [65-149] 75 (03/20 0441) Resp:  [10-20] 10 (03/20 0441) BP: (105-153)/(51-84) 138/75 (03/20 0441) SpO2:  [92 %-99 %] 98 % (03/20 0441) FiO2 (%):  [30 %] 30 % (03/20 0339) Weight:  [255 lb 8.2 oz (115.9 kg)] 255 lb 8.2 oz (115.9 kg) (03/20 0339) Physical Exam: General: NAD, pleasant Eyes: PERRL, EOMI, no conjunctival pallor or injection ENTM: Moist mucous membranes Cardiovascular: RRR, no m/r/g, no LE edema Respiratory: CTA BL, normal work of breathing Gastrointestinal: soft, nontender, nondistended, normoactive BS MSK: moves 4 extremities equally Derm: no rashes appreciated Neuro: CN II-XII grossly intact Psych: AOx3, appropriate affect  Laboratory: Recent Labs  Lab 03/13/18 0702 03/14/18 0328 03/15/18 0228  WBC 8.1 7.7 6.2  HGB 11.2* 11.8* 11.6*  HCT 38.6 41.4 40.7  PLT 213 185 154   Recent Labs  Lab 03/13/18 0702 03/14/18  1610 03/15/18 0228  NA 146* 144 141  K 4.9 4.6 5.6*  CL 101 99* 97*  CO2 36* 35* 34*  BUN 55* 45* 49*  CREATININE 1.76* 1.63* 1.75*  CALCIUM 9.5 9.3 9.0  GLUCOSE 124* 155* 207*   Vitamin B12 1,030 Folate  pending  Pulmonary Functions Testing Results:  FVC-Pre L 0.62 P  FVC-%Pred-Pre % 19 P  FEV1-Pre L 0.44 P  FEV1-%Pred-Pre % 18 P  FEV6-Pre L 0.62 P  FEV6-%Pred-Pre % 20 P  Pre FEV1/FVC ratio % 71 P  FEV1FVC-%Pred-Pre % 93 P  Pre FEV6/FVC Ratio % 100 P  FEV6FVC-%Pred-Pre % 104 P  FEF 25-75 Pre L/sec 0.30 P  FEF2575-%Pred-Pre % 14 P   Imaging/Diagnostic Tests: ECHO: Study Conclusions: - Left ventricle: The cavity size was normal. Wall thickness was increased in a pattern of moderate LVH. Systolic function was normal. The estimated ejection fraction was in the range of 45% to 50%. - Aortic valve: Trileaflet; normal thickness, mildly calcified   leaflets. - Mitral valve: Mildly thickened, moderately calcified leaflets . - Left atrium: The appendage was severely dilated. - Right ventricle: The cavity size was dilated. Wall thickness was normal. Systolic function was reduced. - Right atrium: The atrium was severely dilated. - Pulmonary arteries: Systolic pressure was moderately increased. PA peak pressure: 52 mm Hg (S).  Dg Chest 2 View  Result Date: 03/09/2018 CLINICAL DATA:  Shortness of breath and chest pain EXAM: CHEST - 2 VIEW COMPARISON:  August 31, 2017 FINDINGS: There is no edema or consolidation. Heart is mildly enlarged with pulmonary vascularity within normal limits. There is aortic atherosclerosis. No adenopathy. There is an old healed fracture of the left clavicle. IMPRESSION: Cardiomegaly.  Aortic atherosclerosis.  No edema or consolidation. Aortic Atherosclerosis (ICD10-I70.0). Electronically Signed   By: Bretta Bang III M.D.   On: 03/09/2018 13:30   Preeya Cleckley, [place], DO 03/15/2018, 8:26 AM PGY-1, [facility] FPTS Intern pager: [phone], text pages welcome

## 2018-03-15 NOTE — Progress Notes (Signed)
ANTICOAGULATION CONSULT NOTE  Pharmacy Consult for Warfarin Indication: atrial fibrillation  Patient Measurements: Height: 5\' 5"  (165.1 cm) Weight: 255 lb 8.2 oz (115.9 kg) IBW/kg (Calculated) : 57  Labs: Recent Labs    03/13/18 0702 03/14/18 0328 03/15/18 0228  HGB 11.2* 11.8* 11.6*  HCT 38.6 41.4 40.7  PLT 213 185 154  LABPROT 24.1* 25.7* 26.7*  INR 2.18 2.37 2.49  CREATININE 1.76* 1.63* 1.75*   Estimated Creatinine Clearance: 39.1 mL/min (A) (by C-G formula based on SCr of 1.75 mg/dL (H)).  Assessment: [age] yoF admitted for AoC respiratory failure, on warfarin PTA for h/o afib. Admit INR 8.28, s/p 2.5mg  IV vitK on 3/14. Pharmacy consulted to resume warfarin on 3/16 - only given lower doses so far. INR remains in therapeutic range. CBC stable. No bleeding noted.   Drug/Drug Interaction Potential: Synthroid + Warfarin - may enhance anticoagulant effect of warfarin.  PTA regimen: 3mg  PO daily (admit INR 8.28)  Goal of Therapy:  INR 2-3 Monitor platelets by anticoagulation protocol: Yes   Plan:  Warfarin 2mg  PO x 1 again tonight Daily INR Monitor s/sx of bleeding  Babs BertinHaley Anaika Santillano, PharmD, BCPS Clinical Pharmacist Clinical phone for 03/15/2018 until 3:30pm: [phone] If after 3:30pm, please call main pharmacy at: [phone] 03/15/2018 9:23 AM

## 2018-03-15 NOTE — Progress Notes (Signed)
   Patient was screened by Clois DupesBoyette, Kili Gracy Godwin for appropriateness for an Inpatient Acute Rehab Consult per RN CM, Silva BandyKristi, due to Palliative team request. I reviewed therapy recommendations and treatment progress. At this time, we are recommending Skilled Nursing Facility for I do not feel patient can participate in the intensity required of our program. Patient quickly fatigues with brief activity. Not able to tolerate 3 hours per day of therapy required.  Clois DupesBoyette, Rigdon Macomber Godwin 03/15/2018, 2:28 PM  I can be reached at [phone].

## 2018-03-15 NOTE — Progress Notes (Signed)
Daily Progress Note   Patient Name: Sylvia Craig       Date: 03/15/2018 DOB: Feb 24, 1951  Age: 67 y.o. MRN#: [id number] Attending Physician: Zenia Resides, MD Primary Care Physician: Zenia Resides, MD Admit Date: 03/09/2018  Reason for Consultation/Follow-up: Establishing goals of care  Subjective: After conversing with Dr. Andria Frames regarding Ms. Leamer's prognosis I met with her prior to our family meeting.  We discussed her strong desire to improve.  She is very willing to go to rehab in an attempt to become stronger.  We discussed the need for weight loss with a diet that is strong in nutrition, intense physical therapy and respiratory therapy.  Ms. Vasko explains that she is not ready to die yet.  She has a 65 year old grand son Cytogeneticist) who live with her for a year.  His parents are drug addicts.  Ms. Stigler son is in outpatient drug rehab and doing well.  Ms. Capri wants to live to see Birdena Crandall get a bit older and to see her son and the baby's mother accept parental responsibility.    The patient's husband, Wille Glaser, and son, Rodman Key, arrived.  We discussed HCPOA.  Even though she has been separated from [place] for 2 years - She wants him to be her HCPOA.  Rodman Key will be the back up. We completed and advance directive.  If the patient is near end of life she does not want life support in any of the three situations listed (incurable illness, dementia, unconscious).  She does not want a feeding tube.   She does want her HCPOA to have the authority to make decisions different from what she has specified.    Ms. Lamia referred to her father.  At end of life he was ventilated.  Ms Adamski felt that this was appropriate because it allowed the family to come and say their goodbyes.  Once the goodbyes were  finished they "unplugged" her father and he passed away.  Ms. Brewbaker would want the same done for her - if she arrests suddenly she would want intubation/life support in order to allow her family time to say goodbye.  We reviewed Ms. Caprio health issues including:  Obesity Hypoventilation Syndrome, CHF, Afib, and chronic kidney disease. We discussed her poor nutritional status and deconditioning.  Her family understands that  due to OHS she is very fragile and at high risk of dying.  Towards the end of our conversation we discussed hospice services both at home and at hospice house.    The family was very Patent attorney.  Assessment: 51 yof with OSA/OSH and recurrent severe hypercarbia, CHF, Afib, CKD stage 3-4, poor nutrition, with minimal mobility - who has a strong will and motivation to live.  Patient Profile/HPI:  67 y.o. female with past medical history of obstructive sleep apnea/hypoventilation syndrome, obesity, CKD stg 3, CHF/[name], DM, and thyroid disease  who was admitted on 03/09/2018 with altered mental status secondary to hypercarbia (CO2 of 86).  Reportedly Ms. Leite was not compliant at home with her CPAP.  Her respiratory status in the hospital has remained very tenuous.  She requires bipap each time she sleeps or she quickly becomes hypercarbic again.   Length of Stay: 6  Current Medications: Scheduled Meds:  . furosemide  40 mg Oral Daily  . insulin aspart  0-9 Units Subcutaneous TID WC  . insulin glargine  6 Units Subcutaneous QHS  . lactose free nutrition  237 mL Oral BID BM  . levothyroxine  150 mcg Oral QAC breakfast  . metoprolol succinate  100 mg Oral Daily  . polyethylene glycol  17 g Oral Daily  . rosuvastatin  10 mg Oral QHS  . warfarin  2 mg Oral ONCE-1800  . Warfarin - Pharmacist Dosing Inpatient   Does not apply q1800    Continuous Infusions:   PRN Meds: acetaminophen **OR** acetaminophen  Physical Exam        Well developed obese female Awake, alert,  coherent, appropriate. No respiratory distress.  Vital Signs: BP 116/78   Pulse 93   Temp (!) 96.1 F (35.6 C) (Axillary)   Resp 10   Ht 5' 5"  (1.651 m)   Wt 115.9 kg (255 lb 8.2 oz)   SpO2 98%   BMI 42.52 kg/m  SpO2: SpO2: 98 % O2 Device: O2 Device: Bi-PAP O2 Flow Rate: O2 Flow Rate (L/min): 4 L/min  Intake/output summary:   Intake/Output Summary (Last 24 hours) at 03/15/2018 0949 Last data filed at 03/15/2018 0558 Gross per 24 hour  Intake 480 ml  Output 650 ml  Net -170 ml   LBM: Last BM Date: 03/08/18 Baseline Weight: Weight: 113.9 kg (251 lb) Most recent weight: Weight: 115.9 kg (255 lb 8.2 oz)       Palliative Assessment/Data: 40%      Patient Active Problem List   Diagnosis Date Noted  . Systolic congestive heart failure ([place])   . Palliative care encounter   . Acute on chronic respiratory failure with hypoxia and hypercapnia (HCC)   . Obesity hypoventilation syndrome ([place])   . AKI (acute kidney injury) ([place]) 03/09/2018  . Shortness of breath 03/09/2018  . Systolic CHF, acute on chronic (HCC)   . Venous stasis ulcers of both lower extremities ([place]) 12/30/2017  . Edema 10/09/2017  . CKD (chronic kidney disease) stage 3, GFR 30-59 ml/min (HCC) 10/09/2017  . Type 2 diabetes mellitus ([place]) 10/07/2017  . Abnormal ankle brachial index (ABI) 10/06/2017  . Chronic respiratory failure ([place]) 10/06/2017  . Chronic atrial fibrillation ([place]) 10/04/2017  . Coronary artery disease involving native coronary artery of native heart with angina pectoris ([place]) 08/25/2017  . UTI (urinary tract infection) 08/25/2017  . Multiple open wounds of lower leg 08/25/2017  . Unsteady gait 08/11/2017  . Obstructive sleep apnea   . Necrotic wound of left hand (  HCC)   . SOB (shortness of breath)   . Morbid obesity ([place]) 07/26/2017  . (HFpEF) heart failure with preserved ejection fraction ([place]) 01/16/2016  . Anemia, iron deficiency 11/16/2014    Palliative Care Plan     Recommendations/Plan:  Patient desires full scope treatment with inpatient rehab  Order placed for nutrition consult  Advance Directive completed.  Will ask Chaplain to notarize it.  Continue full code - if the patient coded suddenly she would want intubation to allow her family to say goodbye.  If patient is near end of life (days - weeks) from any of the situations presented in the advance directive she would want to avoid life support.  Her estranged husband (and close friend) Wille Glaser is her HCPOA.   Her son Rodman Key is the back up.  Goals of Care and Additional Recommendations:  Limitations on Scope of Treatment: Full Scope Treatment  Code Status:  Full code  Prognosis:   < 6 months given OHS with recurrent hypercarbia, CHF, limited mobility, morbid obesity, poor nutrition and advanced kidney disease   Discharge Planning:  [place] for rehab with Palliative care service follow-up - Patient would prefer CIR.  Care plan was discussed with family.  Thank you for allowing the Palliative Medicine Team to assist in the care of this patient.  Total time spent:  60 min.     Greater than 50%  of this time was spent counseling and coordinating care related to the above assessment and plan.  Florentina Jenny, PA-C Palliative Medicine  Please contact Palliative MedicineTeam phone at [phone] for questions and concerns between 7 am - 7 pm.   Please see AMION for individual provider pager numbers.

## 2018-03-16 ENCOUNTER — Encounter (HOSPITAL_COMMUNITY): Payer: Medicare Other

## 2018-03-16 DIAGNOSIS — L8990 Pressure ulcer of unspecified site, unspecified stage: Secondary | ICD-10-CM

## 2018-03-16 LAB — BASIC METABOLIC PANEL
ANION GAP: 11 (ref 5–15)
BUN: 44 mg/dL — ABNORMAL HIGH (ref 6–20)
CALCIUM: 9.4 mg/dL (ref 8.9–10.3)
CHLORIDE: 96 mmol/L — AB (ref 101–111)
CO2: 38 mmol/L — AB (ref 22–32)
Creatinine, Ser: 1.8 mg/dL — ABNORMAL HIGH (ref 0.44–1.00)
GFR calc non Af Amer: 28 mL/min — ABNORMAL LOW (ref >60)
GFR, EST AFRICAN AMERICAN: 32 mL/min — AB (ref >60)
Glucose, Bld: 133 mg/dL — ABNORMAL HIGH (ref 65–99)
Potassium: 5.1 mmol/L (ref 3.5–5.1)
Sodium: 145 mmol/L (ref 135–145)

## 2018-03-16 LAB — CBC
HCT: 43.3 % (ref 36.0–46.0)
HEMOGLOBIN: 12.3 g/dL (ref 12.0–15.0)
MCH: 30.6 pg (ref 26.0–34.0)
MCHC: 28.4 g/dL — ABNORMAL LOW (ref 30.0–36.0)
MCV: 107.7 fL — ABNORMAL HIGH (ref 78.0–100.0)
Platelets: 169 10*3/uL (ref 150–400)
RBC: 4.02 MIL/uL (ref 3.87–5.11)
RDW: 15.3 % (ref 11.5–15.5)
WBC: 9.1 10*3/uL (ref 4.0–10.5)

## 2018-03-16 LAB — GLUCOSE, CAPILLARY
GLUCOSE-CAPILLARY: 115 mg/dL — AB (ref 65–99)
Glucose-Capillary: 228 mg/dL — ABNORMAL HIGH (ref 65–99)
Glucose-Capillary: 229 mg/dL — ABNORMAL HIGH (ref 65–99)

## 2018-03-16 LAB — PROTIME-INR
INR: 2.36
PROTHROMBIN TIME: 25.6 s — AB (ref 11.4–15.2)

## 2018-03-16 MED ORDER — TORSEMIDE 20 MG PO TABS: 20.0000 mg | ORAL_TABLET | Freq: Every day | ORAL | Status: DC

## 2018-03-16 MED ORDER — WARFARIN SODIUM 2 MG PO TABS
2.0000 mg | ORAL_TABLET | Freq: Once | ORAL | Status: AC
  Administered 2018-03-16: 2 mg via ORAL
  Filled 2018-03-16: qty 1

## 2018-03-16 MED ORDER — TORSEMIDE 20 MG PO TABS
20.0000 mg | ORAL_TABLET | Freq: Every day | ORAL | Status: DC
  Administered 2018-03-17: 20 mg via ORAL
  Filled 2018-03-16: qty 1

## 2018-03-16 NOTE — Discharge Summary (Addendum)
Family Medicine Teaching [facility] Discharge Summary  Patient name: Sylvia Craig Medical record number: [id number] Date of birth: November 08, 1951 Age: 67 y.o. 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, III Atrial fibrillation Pulmonary HTN Hypothyroidism Insulin-dependent DM Leg wounds Anemia  Disposition: SNF  Discharge Condition: stable  Discharge Exam: General: NAD, pleasant, bandage in place on nose Eyes: PERRL, EOMI, no conjunctival pallor or injection ENTM: Moist mucous membranes Cardiovascular: RRR, no m/r/g, 1+ pitting LE edema Respiratory: CTA BL, normal work of breathing Gastrointestinal: soft, nontender, nondistended, normoactive BS MSK: moves 4 extremities equally Derm: no rashes appreciated Neuro: CN II-XII grossly intact Psych: AOx3, appropriate affect  Brief Hospital Course:  This 67 year old female presented with increasing shortness of breath and was altered on arrival to ED. patient was found to have chronic compensated respiratory acidosis on arrival.  Pulmonology was consulted and followed along with patient.  They believe that her chronic compensated respiratory acidosis is from obesity hypoventilation syndrome.  Patient was placed on BiPAP and improved significantly after wearing BiPAP for extended period of time.  It was recommended that patient be on BiPAP nightly with periodic use of BiPAP during the day in order to avoid acute on chronic compensated respiratory acidosis.    While admitted patient was also increased with weight and was given IV Lasix in order to help with diuresis.  Patient was restarted on her home Lasix dosage which  did not provide adequate output.  Patient was started on torsemide 20 mg daily for better absorption.  Patient had a repeat echo which showed an EF of 45-50% as well as dilated atria, which is likely due to OHS.  Patient also with history of pulmonary hypertension which is stable.  TSH was noted to be elevated on arrival to ED.  Patient's Synthroid 137 MCG was increased to 150 MCG.   Other chronic illnesses were managed while patient was admitted including her leg wounds which was consulted by wound care in order to care for these.  Patient also had control of blood glucose on Lantus 6 units with sliding scale.    Patient also noted to have AK I on admission which had improved prior to discharge.  This will need to be monitored after discharge.  Patient was  also seen by palliative care while admitted.  Issues for Follow Up:  1. Patient will require BiPAP nightly. 2. patient will need monitoring of BMP in order to track AK I with new torsemide of 20 mg daily. Unclear of patient's baseline weight, however lowest weight during admission 243.  Ensure patient is diuresing well on new medication. 3. Synthroid increased to 150 MCG daily. 4. Patient seen by dietitian while here and is very motivated for weight loss.  Continue nutritional education and encourage patient to continue with physical therapy and help patient with goals for weight loss. 5. Patient with anemia and increased MCV with unknown causes.  Will need repeat CBC.  Significant Procedures: none  Significant Labs and Imaging:  Recent Labs  Lab 03/15/18 0228 03/16/18 0242 03/17/18 0334  WBC 6.2 9.1 7.0  HGB 11.6* 12.3 12.1  HCT 40.7 43.3 41.9  PLT 154 169 181   Recent Labs  Lab 03/13/18 0702 03/14/18  4098 03/15/18 0228 03/15/18 0844 03/16/18 0242 03/17/18 0334  NA 146* 144 141  --  145 144  K 4.9 4.6 5.6* 4.5 5.1 4.8  CL 101 99* 97*  --  96* 94*  CO2 36* 35* 34*  --  38* 40*  GLUCOSE 124* 155* 207*  --  133* 184*  BUN 55*  45* 49*  --  44* 43*  CREATININE 1.76* 1.63* 1.75*  --  1.80* 1.90*  CALCIUM 9.5 9.3 9.0  --  9.4 9.6  ALBUMIN  --   --   --  2.8*  --   --    Vitamin B12 1,030 Folate wnl  ECHO: Study Conclusions: - Left ventricle: The cavity size was normal. Wall thickness was increased in a pattern of moderate LVH. Systolic function was normal. The estimated ejection fraction was in the range of 45% to 50%. - Aortic valve: Trileaflet; normal thickness, mildly calcified leaflets. - Mitral valve: Mildly thickened, moderately calcified leaflets . - Left atrium: The appendage was severely dilated. - Right ventricle: The cavity size was dilated. Wall thickness was normal. Systolic function was reduced. - Right atrium: The atrium was severely dilated. - Pulmonary arteries: Systolic pressure was moderately increased. PA peak pressure: 52 mm Hg (S).  Results/Tests Pending at Time of Discharge:  none  Discharge Medications:  Allergies as of 03/17/2018      Reactions   Codeine Rash, Other (See Comments)   Edema, also   Glucosamine Shortness Of Breath   Other Shortness Of Breath, Other (See Comments)   Crab = chest tightness, also   Oxycodone Rash, Other (See Comments)   Bad dreams, also   Shellfish Allergy Shortness Of Breath   Shellfish-derived Products Shortness Of Breath   Atorvastatin Other (See Comments)   Muscle aches   Broccoli [place] Oleracea Italica] Other (See Comments)   Very limited intake due to being on Coumadin   Lisinopril Other (See Comments), Cough   "Made me hurt all over," also   Oxycodone-aspirin Rash, Other (See Comments)   Bad dreams, also      Medication List    STOP taking these medications   benzonatate 200 MG capsule Commonly known as:  TESSALON   ferrous sulfate 325 (65 FE) MG EC tablet   furosemide 40 MG tablet Commonly known as:  LASIX     TAKE these medications   acetaminophen 500 MG tablet Commonly known as:  TYLENOL Take 1,000 mg by mouth every  6 (six) hours as needed for moderate pain or headache.   cholecalciferol 1000 units tablet Commonly known as:  VITAMIN D TAKE 1 TABLET (1,000 UNITS TOTAL) BY MOUTH AT BEDTIME.   insulin aspart 100 UNIT/ML injection Commonly known as:  novoLOG Inject 0-9 Units into the skin 3 (three) times daily with meals. What changed:  Another medication with the same name was changed. Make sure you understand how and when to take each.   insulin aspart 100 UNIT/ML injection Commonly known as:  novoLOG Inject 9 Units into the skin 3 (three) times daily before meals. What changed:    how much to take  additional instructions   insulin glargine 100 UNIT/ML injection Commonly known as:  LANTUS Inject 0.12 mLs (12 Units total) into the skin at bedtime. What changed:  Another medication with the same name was added. Make sure you understand how and when to take each.   insulin glargine 100 UNIT/ML injection Commonly known as:  LANTUS Inject 0.06 mLs (6 Units total)  into the skin at bedtime. What changed:  You were already taking a medication with the same name, and this prescription was added. Make sure you understand how and when to take each.   levothyroxine 150 MCG tablet Commonly known as:  SYNTHROID, LEVOTHROID Take 1 tablet (150 mcg total) by mouth daily before breakfast. Start taking on:  03/18/2018 What changed:    medication strength  how much to take   metoprolol succinate 100 MG 24 hr tablet Commonly known as:  TOPROL-XL Take 1 tablet (100 mg total) by mouth daily.   multivitamin with minerals Tabs tablet Take 1 tablet by mouth daily.   nitroGLYCERIN 0.4 MG SL tablet Commonly known as:  NITROSTAT Take 0.4 mg under the toungue as needed for chest pain What changed:    how much to take  how to take this  when to take this  reasons to take this  additional instructions   rosuvastatin 10 MG tablet Commonly known as:  CRESTOR Take 1 tablet (10 mg total) by mouth at  bedtime.   senna-docusate 8.6-50 MG tablet Commonly known as:  Senokot-S Take 1 tablet by mouth at bedtime as needed for mild constipation.   torsemide 20 MG tablet Commonly known as:  DEMADEX Take 1 tablet (20 mg total) by mouth daily. Start taking on:  03/18/2018   vitamin C 500 MG tablet Commonly known as:  ASCORBIC ACID Take 2 tablets (1,000 mg total) by mouth at bedtime.   warfarin 3 MG tablet Commonly known as:  COUMADIN Take as directed. If you are unsure how to take this medication, talk to your nurse or doctor. Original instructions:  3 mg daily What changed:    how much to take  how to take this  when to take this  additional instructions   zinc sulfate 220 (50 Zn) MG capsule Take 1 capsule (220 mg total) by mouth daily. What changed:  Another medication with the same name was removed. Continue taking this medication, and follow the directions you see here.            Durable Medical Equipment  (From admission, onward)        Start     Ordered   03/15/18 1419  For home use only DME Bipap  Once    Comments:  Full face mask-large 14 set rate, Resp. 21, 12/6, 30% (4L), PIP 14  Question Answer Comment  Bleed in oxygen (LPM) 4L   Inspiratory pressure 12   Expiratory pressure OTHER SEE COMMENTS      03/15/18 1424     Discharge Instructions: Please refer to Patient Instructions section of EMR for full details.  Patient was counseled important signs and symptoms that should prompt return to medical care, changes in medications, dietary instructions, activity restrictions, and follow up appointments.   Follow-Up Appointments: Follow-up Information    Lyn Records, MD Follow up.   Specialty:  Cardiology Contact information: [address] [place] [address] [phone]        [name], MD Follow up on 03/21/2018.   Specialty:  Family Medicine Why:  Your appointment is scheduled for 1:30 pm. Please arrive 15 mintues  early.  Contact information: [address] [place] [address] [phone]        [facility] Follow up.   Specialty:  Home Health Services Why:  HHRN/PT/aide resumed- they wil call you for resumption of services Contact information: [address]  [place] [address] [phone]        [facility] - Dme Follow up.   Why:  Bipap/NIV arranged-  Contact information: [address] [place] [address] [phone]           Elaisha Zahniser, [place], DO 03/17/2018, 12:28 PM PGY-1, [facility]

## 2018-03-16 NOTE — Progress Notes (Signed)
ANTICOAGULATION CONSULT NOTE  Pharmacy Consult for Warfarin Indication: atrial fibrillation  Patient Measurements: Height: 5\' 5"  (165.1 cm) Weight: 243 lb 13.3 oz (110.6 kg) IBW/kg (Calculated) : 57  Labs: Recent Labs    03/14/18 0328 03/14/18 1004 03/15/18 0228 03/16/18 0242  HGB 11.8*  --  11.6* 12.3  HCT 41.4 35.3 40.7 43.3  PLT 185  --  154 169  LABPROT 25.7*  --  26.7*  --   INR 2.37  --  2.49  --   CREATININE 1.63*  --  1.75* 1.80*   Estimated Creatinine Clearance: 37 mL/min (A) (by C-G formula based on SCr of 1.8 mg/dL (H)).  Assessment: [age] yoF admitted for AoC respiratory failure, on warfarin PTA for h/o afib. Admit INR 8.28, s/p 2.5mg  IV vitK on 3/14. Pharmacy consulted to resume warfarin on 3/16 - only given lower doses than PTA dose so far. INR remains in therapeutic range. CBC stable. No bleeding noted.   Drug/Drug Interaction Potential: Synthroid + Warfarin - may enhance anticoagulant effect of warfarin.  PTA regimen: 3mg  PO daily (admit INR 8.28)  Goal of Therapy:  INR 2-3 Monitor platelets by anticoagulation protocol: Yes   Plan:  Warfarin 2mg  PO x 1 again tonight Daily INR Monitor s/sx of bleeding  Babs BertinHaley Anagha Loseke, PharmD, BCPS Clinical Pharmacist Clinical phone for 03/16/2018 until 3:30pm: [phone] If after 3:30pm, please call main pharmacy at: [phone] 03/16/2018 9:50 AM

## 2018-03-16 NOTE — Progress Notes (Signed)
Physical Therapy Treatment Patient Details Name: Sylvia Craig MRN: [id number] DOB: 14-Nov-1951 Today's Date: 03/16/2018    History of Present Illness Pt is a 67 y.o. female presenting with SOB and tachycardia; worked up for acute on chronic HF. Also with AKI on CKDIII. PMH includes HTN, hypothyroidism, HFmrEF, HLD, [name], insulin-dependent DM, a-fib on coumadin.   PT Comments    Pt slowly progressing with mobility. Able to transfer and ambulate from bed<>BSC with RW and close min guard for balance; dependent for pericare. Pt remains limited by significant fatigue and decreased activity tolerance. Feel pt would benefit from short-term SNF-level therapies to maximize functional mobility and independence prior to return home; pt in agreement with this. Discharge recs updated to reflect this. Will follow acutely.     Follow Up Recommendations  SNF;Supervision for mobility/OOB     Equipment Recommendations  (TBD next venue)    Recommendations for Other Services       Precautions / Restrictions Precautions Precautions: Fall Restrictions Weight Bearing Restrictions: No    Mobility  Bed Mobility Overal bed mobility: Needs Assistance Bed Mobility: Supine to Sit;Sit to Supine     Supine to sit: Min guard Sit to supine: Mod assist   General bed mobility comments: Increased time and effort for bed mobility with use of bed rails. ModA to assist BLEs back into bed  Transfers Overall transfer level: Needs assistance Equipment used: Rolling walker (2 wheeled) Transfers: Sit to/from Stand Sit to Stand: Min guard         General transfer comment: Min guard to stand from bed and BSC with RW. Pt reliant on BUE support in standing and dependent for perianal care  Ambulation/Gait Ambulation/Gait assistance: Min guard Ambulation Distance (Feet): 3 Feet Assistive device: Rolling walker (2 wheeled) Gait Pattern/deviations: Step-to pattern;Wide base of support Gait velocity: Decreased Gait  velocity interpretation: <1.8 ft/sec, indicative of risk for recurrent falls General Gait Details: Took steps from bed<>BSC with RW and min guard for balance; increased time and effort. DOE 2/4; SpO2 >90% on RA   Stairs            Wheelchair Mobility    Modified Rankin (Stroke Patients Only)       Balance Overall balance assessment: Needs assistance Sitting-balance support: No upper extremity supported;Feet supported Sitting balance-Leahy Scale: Fair     Standing balance support: Bilateral upper extremity supported Standing balance-Leahy Scale: Poor Standing balance comment: Dependent for pericare while standing                            Cognition Arousal/Alertness: Awake/alert Behavior During Therapy: Flat affect Overall Cognitive Status: Within Functional Limits for tasks assessed                                        Exercises      General Comments        Pertinent Vitals/Pain Pain Assessment: No/denies pain    Home Living                      Prior Function            PT Goals (current goals can now be found in the care plan section) Acute Rehab PT Goals Patient Stated Goal: return home PT Goal Formulation: With patient Time For Goal Achievement: 03/30/18 Potential to Achieve Goals: Good  Progress towards PT goals: Progressing toward goals    Frequency    Min 2X/week      PT Plan Discharge plan needs to be updated;Frequency needs to be updated    Co-evaluation              AM-PAC PT "6 Clicks" Daily Activity  Outcome Measure  Difficulty turning over in bed (including adjusting bedclothes, sheets and blankets)?: A Little Difficulty moving from lying on back to sitting on the side of the bed? : A Little Difficulty sitting down on and standing up from a chair with arms (e.g., wheelchair, bedside commode, etc,.)?: Unable Help needed moving to and from a bed to chair (including a wheelchair)?: A  Little Help needed walking in hospital room?: A Lot Help needed climbing 3-5 steps with a railing? : A Lot 6 Click Score: 14    End of Session Equipment Utilized During Treatment: Gait belt;Oxygen Activity Tolerance: Patient limited by fatigue Patient left: in bed;with call bell/phone within reach Nurse Communication: Mobility status PT Visit Diagnosis: Unsteadiness on feet (R26.81);Muscle weakness (generalized) (M62.81)     Time: 1435-1500 PT Time Calculation (min) (ACUTE ONLY): 25 min  Charges:  $Therapeutic Activity: 8-22 mins $Self Care/Home Management: 8-22                    G Codes:      Ina HomesJaclyn Milliana Reddoch, PT, DPT Acute Rehab Services  Pager: [phone]  Malachy ChamberJaclyn L Kristia Jupiter 03/16/2018, 3:07 PM

## 2018-03-16 NOTE — Clinical Social Work Note (Signed)
Clinical Social Work Assessment  Patient Details  Name: Sylvia Craig MRN: [id number] Date of Birth: April 23, 1951  Date of referral:  03/16/18               Reason for consult:  Discharge Planning, Facility Placement                Permission sought to share information with:  Family Supports Permission granted to share information::  Yes, Verbal Permission Granted  Name::     Gwenneth Whiteman  Agency::  snf  Relationship::  son  Contact Information:  [phone]  Housing/Transportation Living arrangements for the past 2 months:  [place] of Information:  Patient Patient Interpreter Needed:  None Criminal Activity/Legal Involvement Pertinent to Current Situation/Hospitalization:  No - Comment as needed Significant Relationships:  Adult Children, Spouse(stated thy have be separated for 2 years but remain friends) Lives with:  Adult Children Do you feel safe going back to the place where you live?  Yes Need for family participation in patient care:  Yes (Comment)  Care giving concerns:  No family at bedside. Patient stated her adult son lives in the home with her. Patient stated that sometimes her 12 year old grandson come and stays but stated since she has been sick he stays less often. Patient stated that her son has been in a drug rehab for a year. Patient stated that her husband who she has been separated too for 2 years will come and stay in the home with her to help. Patient stated she is still close to her husband even they are separated   Facilities manager / plan:  CSW met patient at bedside to discuss rehab. Patient stated she has been at a SNF in the past and enjoyed her time. Patient stated while she was at a SNF in the past the facility had set her up with home health resources with [facility] after her discharge. Patient stated she would like that to happen again. Patient is agreeable to discharge to SNF but has some hesitation due to her son being in a drug rehab.  Patient wants to be there for him but understands that she needs to take care of her health.   Employment status:  Retired Nurse, adult PT Recommendations:  Home with Home Health(will reassess for snf) Information / Referral to community resources:  [place]  Patient/Family's Response to care:  Patient stated she's feeling a lot better and appreciates CSW role in care  Patient/Family's Understanding of and Emotional Response to Diagnosis, Current Treatment, and Prognosis: Patient agreeable to discharge to rehab. CSW awaiting PT evaluation to be able to place patient and attain authorization through insurance   Emotional Assessment Appearance:  Appears stated age Attitude/Demeanor/Rapport:  Engaged Affect (typically observed):  Accepting Orientation:  Oriented to Self, Oriented to Situation, Oriented to Place, Oriented to  Time Alcohol / Substance use:  Not Applicable Psych involvement (Current and /or in the community):  No (Comment)  Discharge Needs  Concerns to be addressed:  No discharge needs identified Readmission within the last 30 days:  No Current discharge risk:  None Barriers to Discharge:  No Barriers Identified   Wende Neighbors, LCSW 03/16/2018, 10:31 AM

## 2018-03-16 NOTE — [place] FL2 (Signed)
[place] MEDICAID FL2 LEVEL OF CARE SCREENING TOOL     IDENTIFICATION  Patient Name: Sylvia Craig Birthdate: 26-Jun-1951 Sex: female Admission Date (Current Location): 03/09/2018  [facility] and [place] Number:  Producer, television/film/video and Address:  [facility] [place], [address] [place], [place] [address]      Provider Number: [id number]  Attending Physician Name and Address:  Moses Manners, MD  Relative Name and Phone Number:  Lativia Velie, [phone]    Current Level of Care: Hospital Recommended Level of Care: Skilled Nursing Facility Prior Approval Number:    Date Approved/Denied:   PASRR Number: [id number]  Discharge Plan: SNF    Current Diagnoses: Patient Active Problem List   Diagnosis Date Noted  . Pressure injury of skin 03/16/2018  . Goals of care, counseling/discussion   . Systolic congestive heart failure (HCC)   . Palliative care encounter   . Acute on chronic respiratory failure with hypoxia and hypercapnia (HCC)   . Obesity hypoventilation syndrome (HCC)   . AKI (acute kidney injury) (HCC) 03/09/2018  . Shortness of breath 03/09/2018  . Systolic CHF, acute on chronic (HCC)   . Venous stasis ulcers of both lower extremities (HCC) 12/30/2017  . Edema 10/09/2017  . CKD (chronic kidney disease) stage 3, GFR 30-59 ml/min (HCC) 10/09/2017  . Type 2 diabetes mellitus (HCC) 10/07/2017  . Abnormal ankle brachial index (ABI) 10/06/2017  . Chronic respiratory failure (HCC) 10/06/2017  . Chronic atrial fibrillation (HCC) 10/04/2017  . Coronary artery disease involving native coronary artery of native heart with angina pectoris (HCC) 08/25/2017  . UTI (urinary tract infection) 08/25/2017  . Multiple open wounds of lower leg 08/25/2017  . Unsteady gait 08/11/2017  . Obstructive sleep apnea   . Necrotic wound of left hand (HCC)   . SOB (shortness of breath)   . Morbid obesity (HCC) 07/26/2017  . (HFpEF) heart failure with preserved  ejection fraction (HCC) 01/16/2016  . Anemia, iron deficiency 11/16/2014    Orientation RESPIRATION BLADDER Height & Weight     Self, Time, Situation, Place  O2, Other (Comment)(2L/ bibap at night) Incontinent, External catheter Weight: 243 lb 13.3 oz (110.6 kg) Height:  5\' 5"  (165.1 cm)  BEHAVIORAL SYMPTOMS/MOOD NEUROLOGICAL BOWEL NUTRITION STATUS      Continent Diet(heart healthy/carb modified)  AMBULATORY STATUS COMMUNICATION OF NEEDS Skin   Limited Assist Verbally                         Personal Care Assistance Level of Assistance  Bathing, Feeding, Dressing Bathing Assistance: Limited assistance Feeding assistance: Independent Dressing Assistance: Limited assistance     Functional Limitations Info  Sight, Hearing, Speech Sight Info: Adequate Hearing Info: Adequate Speech Info: Adequate    SPECIAL CARE FACTORS FREQUENCY  PT (By licensed PT), OT (By licensed OT)     PT Frequency: 5x wk OT Frequency: 5x wk            Contractures Contractures Info: Not present    Additional Factors Info  Code Status, Allergies Code Status Info: Full Code Allergies Info: CODEINE, GLUCOSAMINE, OTHER, OXYCODONE, SHELLFISH ALLERGY, SHELLFISH-DERIVED PRODUCTS, ATORVASTATIN, BROCCOLI BRASSICA OLERACEA ITALICA, LISINOPRIL, OXYCODONE-ASPIRIN            Current Medications (03/16/2018):  This is the current hospital active medication list Current Facility-Administered Medications  Medication Dose Route Frequency Provider Last Rate Last Dose  . acetaminophen (TYLENOL) tablet 650 mg  650 mg Oral Q6H PRN  Talbert ForestShirley, [place], DO   650 mg at 03/16/18 1011   Or  . acetaminophen (TYLENOL) suppository 650 mg  650 mg Rectal Q6H PRN [name], [place], DO      . furosemide (LASIX) tablet 40 mg  40 mg Oral Daily Garth Bignessimberlake, Kathryn, MD   40 mg at 03/16/18 1012  . insulin aspart (novoLOG) injection 0-9 Units  0-9 Units Subcutaneous TID WC [name], [place], DO   2 Units at 03/15/18 1646  . insulin  glargine (LANTUS) injection 6 Units  6 Units Subcutaneous QHS [name], [place], DO   6 Units at 03/15/18 2128  . lactose free nutrition (Boost) liquid 237 mL  237 mL Oral BID BM Dellinger, Marianne L, PA-C   237 mL at 03/16/18 1012  . levothyroxine (SYNTHROID, LEVOTHROID) tablet 150 mcg  150 mcg Oral QAC breakfast Garth Bignessimberlake, Kathryn, MD   150 mcg at 03/16/18 1012  . metoprolol succinate (TOPROL-XL) 24 hr tablet 100 mg  100 mg Oral Daily Talbert ForestShirley, [place], DO   100 mg at 03/16/18 1011  . polyethylene glycol (MIRALAX / GLYCOLAX) packet 17 g  17 g Oral Daily Talbert ForestShirley, [place], DO   17 g at 03/16/18 1012  . rosuvastatin (CRESTOR) tablet 10 mg  10 mg Oral QHS [name], [place], DO   10 mg at 03/15/18 2122  . Warfarin - Pharmacist Dosing Inpatient   Does not apply q1800 Hensel, Santiago BumpersWilliam A, MD         Discharge Medications: Please see discharge summary for a list of discharge medications.  Relevant Imaging Results:  Relevant Lab Results:   Additional Information SS# [id number] bibap at night (full face mask medium, IPAP:12, EPAP:6  Althea CharonAshley C Aveer Bartow, LCSW

## 2018-03-16 NOTE — Plan of Care (Signed)
Nutrition Consult/Education Note  RD consulted for nutrition education regarding a low sodium/DM diet.   RD provided "Heart Healthy Consistent Carbohydrate Nutrition Therapy" handout from the Academy of Nutrition and Dietetics. Reviewed patient's dietary recall. Provided examples on ways to decrease sodium in diet. Discouraged intake of processed foods and use of salt shaker.   Encouraged fresh fruits and vegetables as well as whole grain sources of carbohydrates to maximize fiber intake. Discussed different food groups and their effects on blood sugar, emphasizing carbohydrate-containing foods.   Expect fair compliance. Pt has limited mobility due to chronic respiratory issues. She also has little help at home with cooking and grocery shopping as her son is in Rehab for drug abuse.  Body mass index is 40.58 kg/m. Pt meets criteria for Obesity Class III based on current BMI.  No further nutrition interventions warranted at this time.   RD contact information provided. If additional nutrition issues arise, please re-consult RD.  Maureen ChattersKatie Kashmere Staffa, RD, LDN Pager #: [phone] After-Hours Pager #: [phone]

## 2018-03-16 NOTE — Progress Notes (Signed)
Family Medicine Teaching Service Daily Progress Note Intern Pager: [phone]  Patient name: Sylvia Craig Medical record number: [id number] Date of birth: 1951-10-19 Age: 67 y.o. Gender: female  Primary Care Provider: Moses MannersHensel, William A, MD Consultants: pulm to see 3/16 Code Status: Full  Pt Overview and Major Events to Date:  03/14: Admitted and placed on BiPAP 3/15: MS improved, back to [place]  Assessment and Plan: Sylvia Craig is a 67 y.o. female presenting with shortness of breath. PMH is significant for HTN, hypothyroidism, HFmrEF, HLD, [name], insulin-dependent diabetes mellitus, and atrial fibrillation on coumadin.  Acute on chronic respiratory failure: OHS per pulm. Patient will require BiPAP nightly and intermittently during day after discharge. -continuous cardiac monitoring -Monitor respiratory status -supplemental O2 PRN: BiPAP qhs, 2-3L daily, increased to 4L yesterday -continuous pulse ox -consulted pulm, appreciate recs  Acute on Chronic HFmrEF:Last ECHO 08/2017 with EF of 45-50%.Baseline weight appears to be ~250lb during recent office visits, 243 lb today. At home on Metoprolol XL 100 mg daily.  -Continue home Toprol - Daily wts and strict I/O's, only 1.7L output  -restarted home lasix of 40mg  PO daily  AKI on CKD, III, resolving AKI: SCr is 2.25>1.76>1.80 (baseline 1.6).   - avoid nephrotoxic agents  Atrial fibrillation: rate controlled on home toprol XL. Supra-therapeutic INR of 8.2 on admission. Goal INR 2-3.    -CHADS-VASc is 375 (age, gender, CHF, DM, [name]) -Managed with coudmadin and Toprol at home -Coumadin per pharmacy -Continue Toprol as tolerated  Pulmonary HTN: PA peak pressure: 62 mm Hg on ECHO 08/2017.  - Has supplemental oxygen at home, baseline 2-3L  Hypothyroidism: chronic, stable. Home Synthroid 137 mcg.  TSH elevated. -continue 150mcg synthroid  Insulin-dependent DM: Recent A1c 6.6 on 02/21/2018. home meds are lantus 12U and Humalog 4U  TID. CBG's 207, 144 - lantus 6U  - sSSI  Leg wounds: Bilateral LE's with stigmata of chronic venous stasis -There are some wounds bilaterally with serous weeping and surrounding erythema  - Wound care consulted for recs  Anemia: Improving. Hgb 11.8 today. (BL 10-11). At home on daily ferrous sulfate 325 mg.  In setting of CHF, transfusion threshold would be 8.0.   -monitor with daily CBC   FEN/GI: Heart healthy/ Carb modified Prophylaxis: Coumadin per pharmacy  Disposition: continued inpatient stay  Subjective:  Patient is feeling better this morning.  Patient states that she feels as if she had a lot of urine output after getting IV Lasix.  Patient is motivated to lose weight would like to speak to registered dietitian or nutritionist.  Objective: Temp:  [96 F (35.6 C)-96.6 F (35.9 C)] 96 F (35.6 C) (03/21 0306) Pulse Rate:  [51-96] 96 (03/21 0400) Resp:  [12-29] 15 (03/21 0400) BP: (96-153)/(53-98) 132/98 (03/21 0400) SpO2:  [93 %-98 %] 95 % (03/21 0306) FiO2 (%):  [30 %] 30 % (03/21 0306) Weight:  [243 lb 13.3 oz (110.6 kg)] 243 lb 13.3 oz (110.6 kg) (03/21 0306) Physical Exam: General: NAD, pleasant, sitting up in bed with bandage on nose Eyes: PERRL, EOMI, no conjunctival pallor or injection ENTM: Moist mucous membranes Cardiovascular: RRR, no m/r/g, 1+ pitting LE edema Respiratory: CTA BL, normal work of breathing on 3 L [place], difficult exam due to body habitus Gastrointestinal: soft, nontender, nondistended, normoactive BS MSK: moves 4 extremities equally Derm: no rashes appreciated Neuro: CN II-XII grossly intact Psych: AOx3, appropriate affect  Laboratory: Recent Labs  Lab 03/14/18 0328 03/14/18 1004 03/15/18 0228 03/16/18 0242  WBC 7.7  --  6.2 9.1  HGB 11.8*  --  11.6* 12.3  HCT 41.4 35.3 40.7 43.3  PLT 185  --  154 169   Recent Labs  Lab 03/14/18 0328 03/15/18 0228 03/15/18 0844 03/16/18 0242  NA 144 141  --  145  K 4.6 5.6* 4.5 5.1  CL  99* 97*  --  96*  CO2 35* 34*  --  38*  BUN 45* 49*  --  44*  CREATININE 1.63* 1.75*  --  1.80*  CALCIUM 9.3 9.0  --  9.4  GLUCOSE 155* 207*  --  133*   Vitamin B12 1,030 Folate wnl  Imaging/Diagnostic Tests: ECHO: Study Conclusions: - Left ventricle: The cavity size was normal. Wall thickness was increased in a pattern of moderate LVH. Systolic function was normal. The estimated ejection fraction was in the range of 45% to 50%. - Aortic valve: Trileaflet; normal thickness, mildly calcified   leaflets. - Mitral valve: Mildly thickened, moderately calcified leaflets . - Left atrium: The appendage was severely dilated. - Right ventricle: The cavity size was dilated. Wall thickness was normal. Systolic function was reduced. - Right atrium: The atrium was severely dilated. - Pulmonary arteries: Systolic pressure was moderately increased. PA peak pressure: 52 mm Hg (S).  Dg Chest 2 View  Result Date: 03/09/2018 CLINICAL DATA:  Shortness of breath and chest pain EXAM: CHEST - 2 VIEW COMPARISON:  August 31, 2017 FINDINGS: There is no edema or consolidation. Heart is mildly enlarged with pulmonary vascularity within normal limits. There is aortic atherosclerosis. No adenopathy. There is an old healed fracture of the left clavicle. IMPRESSION: Cardiomegaly.  Aortic atherosclerosis.  No edema or consolidation. Aortic Atherosclerosis (ICD10-I70.0). Electronically Signed   By: Bretta Bang III M.D.   On: 03/09/2018 13:30   Korynn Kenedy, [place], DO 03/16/2018, 8:23 AM PGY-1, [facility] FPTS Intern pager: [phone], text pages welcome

## 2018-03-16 NOTE — Progress Notes (Signed)
Spoke with Sylvia Craig (husband) on the phone for approximately 20 minutes.  He wanted to verify some answers he was given on his FMLA form.  The doctor had appropriately written that Mrs. Vessel had a terminal illness.  I explained that this was correct.  Then I took the opportunity to discuss code status with him once again.   I encouraged him to tell [name] that we can't count on intubations in the ICU working 100% of the time.  People often die despite intubation.  I encouraged him to tell [name] to spend time with her family now, talk with them now, live in the now.   Sylvia Craig understood, agreed and stated that he would talk with Sylvia Craig.  Sylvia RichardsMarianne Kendryck Lacroix, PA-C Palliative Medicine Pager: [phone]   No charge note.

## 2018-03-17 LAB — BASIC METABOLIC PANEL
Anion gap: 10 (ref 5–15)
BUN: 43 mg/dL — AB (ref 6–20)
CO2: 40 mmol/L — ABNORMAL HIGH (ref 22–32)
CREATININE: 1.9 mg/dL — AB (ref 0.44–1.00)
Calcium: 9.6 mg/dL (ref 8.9–10.3)
Chloride: 94 mmol/L — ABNORMAL LOW (ref 101–111)
GFR calc Af Amer: 30 mL/min — ABNORMAL LOW (ref >60)
GFR, EST NON AFRICAN AMERICAN: 26 mL/min — AB (ref >60)
Glucose, Bld: 184 mg/dL — ABNORMAL HIGH (ref 65–99)
Potassium: 4.8 mmol/L (ref 3.5–5.1)
SODIUM: 144 mmol/L (ref 135–145)

## 2018-03-17 LAB — GLUCOSE, CAPILLARY
GLUCOSE-CAPILLARY: 236 mg/dL — AB (ref 65–99)
Glucose-Capillary: 213 mg/dL — ABNORMAL HIGH (ref 65–99)
Glucose-Capillary: 151 mg/dL — ABNORMAL HIGH (ref 65–99)

## 2018-03-17 LAB — CBC
HCT: 41.9 % (ref 36.0–46.0)
Hemoglobin: 12.1 g/dL (ref 12.0–15.0)
MCH: 31.1 pg (ref 26.0–34.0)
MCHC: 28.9 g/dL — ABNORMAL LOW (ref 30.0–36.0)
MCV: 107.7 fL — AB (ref 78.0–100.0)
PLATELETS: 181 10*3/uL (ref 150–400)
RBC: 3.89 MIL/uL (ref 3.87–5.11)
RDW: 15.4 % (ref 11.5–15.5)
WBC: 7 10*3/uL (ref 4.0–10.5)

## 2018-03-17 LAB — PROTIME-INR
INR: 2.33
PROTHROMBIN TIME: 25.3 s — AB (ref 11.4–15.2)

## 2018-03-17 LAB — TROPONIN I

## 2018-03-17 MED ORDER — LEVOTHYROXINE SODIUM 150 MCG PO TABS: 150.0000 ug | ORAL_TABLET | Freq: Every day | ORAL | 0 refills | Status: DC

## 2018-03-17 MED ORDER — WARFARIN SODIUM 2 MG PO TABS: 2.0000 mg | ORAL_TABLET | Freq: Once | ORAL | Status: DC

## 2018-03-17 MED ORDER — TORSEMIDE 20 MG PO TABS: 20.0000 mg | ORAL_TABLET | Freq: Every day | ORAL | 0 refills | Status: DC

## 2018-03-17 MED ORDER — INSULIN GLARGINE 100 UNIT/ML ~~LOC~~ SOLN: 6.0000 [IU] | Freq: Every day | SUBCUTANEOUS | 11 refills | Status: DC

## 2018-03-17 NOTE — Care Management Note (Signed)
Case Management Note Sylvia PieriniKristi Gagandeep Kossman RN, BSN Unit 4E-Case Manager [phone]  Patient Details  Name: Sylvia NightDebbie Craig MRN: [id number] Date of Birth: 12-03-1951  Subjective/Objective:  Pt admitted with AKI and Acute resp. failure                  Action/Plan: PTA Pt lived at home - baseline home 02 2-3L, Cpap, - DME order received for home BIPAP at discharge- [facility] will [facility] for NIV/bipap settings- have left form for MD signature on shadow chart and spoken with Sylvia Craig regarding form and need for narrative note in chart- she is to come sign form and place note in epic. Sylvia Craig with [facility] to f/u on NIV/bipap needs for discharge- per PT eval- recommendations for HHPT- will need HH orders placed prior to discharge- CM to follow for continued transition of care needs.   Expected Discharge Date:  03/17/18               Expected Discharge Plan:  Skilled Nursing Facility  In-House Referral:  Clinical Social Work  Discharge planning Services  CM Consult  Post Acute Care Choice:  Durable Medical Equipment, Home Health, Resumption of Svcs/PTA Provider Choice offered to:  Patient  DME Arranged:  NIV, Bipap DME Agency:  [facility]  HH Arranged:  RN, PT, Nurse's Aide HH [facility]  Status of Service:  Completed, signed off  If discussed at [employer] Meetings, dates discussed:    Discharge Disposition: skilled facility   Additional Comments:  03/16/18- 1400- Sylvia Ohair RN, CM- pt for d/c to SNF today- CSW following for placement needs- [facility] and will [facility]/c once they have bipap in place at facility- CM has been informed today that pt has insurance [facility]- [facility] will have order and approval placed on hold [facility]- as long as pt is there less than 4 weeks approval should still be good- otherwise will need to be re-approved for home NIV/Bipap  prior to [facility]. [facility] will follow- also notified [facility] of updated [facility] plan to [facility]F- [facility]- spoke with pt at bedside regarding the above- pt voiced understanding.   03/15/18- 1600- Sylvia How RN, CM- [facility], have spoken to [place] with CIR- and pt is not appropriate for CIR- SNF may be better option if pt agreeable- however PT recommending HH at this time- will have PT re-eval for possible SNF placement [facility]- CSW aware of possible [facility] and will f/u on PT eval.   03/14/18- 1130- Sylvia PieriniKristi Chanc Kervin RN, CM- PFT ordered yesterday- have not been completed yet- call made to Resp. Dept- spoke with Sylvia Craig- per conversation order is still active-pt is on schedule for Thur.-discussed with Sylvia Craig that pt is pending NIV approval for d/c home pending the completion of PFT testing-they will try to complete earlier if possible. - Spoke with pt at bedside- per conversation pt [facility]- does not know where her cpap is, other DME at home includes walker, shower bench and grab bars. Pt is home with son who will provide transportation at discharge. [facility]- would like to have some home team if possible - will  notify [facility] that pt has resumption orders with HHPT added- [facility] team lead is Counselling psychologist)    Zenda Alpers, Lenn Sink, RN 03/17/2018, 2:21 PM

## 2018-03-17 NOTE — Progress Notes (Signed)
ANTICOAGULATION CONSULT NOTE  Pharmacy Consult for Warfarin Indication: atrial fibrillation  Patient Measurements: Height: 5\' 5"  (165.1 cm) Weight: 245 lb 13 oz (111.5 kg) IBW/kg (Calculated) : 57  Labs: Recent Labs    03/15/18 0228 03/16/18 0242 03/16/18 1043 03/17/18 0334  HGB 11.6* 12.3  --  12.1  HCT 40.7 43.3  --  41.9  PLT 154 169  --  181  LABPROT 26.7*  --  25.6* 25.3*  INR 2.49  --  2.36 2.33  CREATININE 1.75* 1.80*  --  1.90*   Estimated Creatinine Clearance: 35.3 mL/min (A) (by C-G formula based on SCr of 1.9 mg/dL (H)).  Assessment: [age] yoF admitted for AoC respiratory failure, on warfarin PTA for h/o afib. Admit INR 8.28, s/p 2.5mg  IV vitK on 3/14. Pharmacy consulted to resume warfarin on 3/16 - only given lower doses than PTA dose so far. INR remains in therapeutic range. CBC stable. No bleeding noted.   Drug/Drug Interaction Potential: Synthroid + Warfarin - may enhance anticoagulant effect of warfarin.  PTA regimen: 3mg  PO daily (admit INR 8.28)  Goal of Therapy:  INR 2-3 Monitor platelets by anticoagulation protocol: Yes   Plan:  Warfarin 2mg  PO x 1 again tonight Daily INR Monitor s/sx of bleeding  Babs BertinHaley Adhira Jamil, PharmD, BCPS Clinical Pharmacist Clinical phone for 03/17/2018 until 3:30pm: [phone] If after 3:30pm, please call main pharmacy at: [phone] 03/17/2018 10:00 AM

## 2018-03-17 NOTE — Progress Notes (Signed)
Clinical Social Worker facilitated patient discharge including contacting patient family and facility to confirm patient discharge plans.  Clinical information faxed to facility and family agreeable with plan.  CS[facility]  RN to call [phone] (pt will go in room 119A) for report prior to discharge.  Clinical Social Worker will sign off for now as social work intervention is no longer needed. Please consult us again if new need arises.  Marrianne MoodAshley Abimael Zeiter, MSW, [employer]CSWA [phone]

## 2018-03-17 NOTE — Progress Notes (Signed)
[facility] @ 5:32 pm and transported via [facility].  Discharge summary reviewed by pt and signed.  All questions answered.  All belongings taken with her.

## 2018-03-17 NOTE — Progress Notes (Signed)
Family Medicine Teaching Service Daily Progress Note Intern Pager: [phone]  Patient name: Sylvia NightDebbie Setter Medical record number: [id number] Date of birth: 1951/08/16 Age: 67 y.o. Gender: female  Primary Care Provider: Moses MannersHensel, William A, MD Consultants: pulm to see 3/16 Code Status: Full  Pt Overview and Major Events to Date:  03/14: Admitted and placed on BiPAP 3/15: MS improved, back to [facility]  Assessment and Plan: Sylvia Craig is a 67 y.o. female presenting with shortness of breath. PMH is significant for HTN, hypothyroidism, HFmrEF, HLD, [name], insulin-dependent diabetes mellitus, and atrial fibrillation on coumadin.  Acute on chronic respiratory failure: OHS per pulm. Patient will require BiPAP nightly and intermittently during day after discharge. -continuous cardiac monitoring -Monitor respiratory status -supplemental O2 PRN: BiPAP qhs, 2-3L daily -continuous pulse ox -consulted pulm, appreciate recs  Acute on Chronic HFmrEF:Last ECHO 08/2017 with EF of 45-50%.Baseline weight appears to be ~250lb during recent office visits, 245 lb today. At home on Metoprolol XL 100 mg daily.  -Continue home Toprol - Daily wts and strict I/O's, only 1.7L output  -will start torsemide for better absorption- 20mg  PO daily  AKI on CKD, III, stable: SCr is 2.25>1.76>1.90 (baseline 1.6).   - avoid nephrotoxic agents  Atrial fibrillation: rate controlled on home toprol XL. Supra-therapeutic INR of 8.2 on admission. Goal INR 2-3.    -CHADS-VASc is 535 (age, gender, CHF, DM, [name]) -Managed with coudmadin and Toprol at home -Coumadin per pharmacy -Continue Toprol as tolerated  Pulmonary HTN: PA peak pressure: 62 mm Hg on ECHO 08/2017.  - Has supplemental oxygen at home, baseline 2-3L  Hypothyroidism: chronic, stable. Home Synthroid 137 mcg.  TSH elevated. -continue 150mcg synthroid  Insulin-dependent DM: Recent A1c 6.6 on 02/21/2018. home meds are lantus 12U and Humalog 4U TID. CBG's  184,151 - lantus 6U  - sSSI  Leg wounds: Bilateral LE's with stigmata of chronic venous stasis -There are some wounds bilaterally with serous weeping and surrounding erythema  - Wound care consulted for recs  Anemia: Improving. Hgb 11.8 today. (BL 10-11). At home on daily ferrous sulfate 325 mg.  In setting of CHF, transfusion threshold would be 8.0.   -monitor with daily CBC   FEN/GI: Heart healthy/ Carb modified Prophylaxis: Coumadin per pharmacy  Disposition: medically ready for discharge if patient has home BiPAP  Subjective:  Patient states that she is feeling much better today.  She does report that she is having some on and off chest tightness.  Reports that this is been happening for quite a while but this is the first time she is complained about it since admission.  Objective: Temp:  [97.6 F (36.4 C)-97.8 F (36.6 C)] 97.6 F (36.4 C) (03/22 0438) Pulse Rate:  [75-89] 85 (03/22 0438) Resp:  [15-21] 18 (03/22 0438) BP: (110-145)/(54-94) 145/94 (03/22 0438) SpO2:  [95 %-100 %] 100 % (03/22 0438) Weight:  [245 lb 13 oz (111.5 kg)] 245 lb 13 oz (111.5 kg) (03/22 0438) Physical Exam: General: NAD, pleasant, bandage in place on nose Eyes: PERRL, EOMI, no conjunctival pallor or injection ENTM: Moist mucous membranes Cardiovascular: RRR, no m/r/g, 1+ pitting LE edema Respiratory: CTA BL, normal work of breathing Gastrointestinal: soft, nontender, nondistended, normoactive BS MSK: moves 4 extremities equally Derm: no rashes appreciated Neuro: CN II-XII grossly intact Psych: AOx3, appropriate affect  Laboratory: Recent Labs  Lab 03/15/18 0228 03/16/18 0242 03/17/18 0334  WBC 6.2 9.1 7.0  HGB 11.6* 12.3 12.1  HCT 40.7 43.3 41.9  PLT 154 169 181  Recent Labs  Lab 03/15/18 0228 03/15/18 0844 03/16/18 0242 03/17/18 0334  NA 141  --  145 144  K 5.6* 4.5 5.1 4.8  CL 97*  --  96* 94*  CO2 34*  --  38* 40*  BUN 49*  --  44* 43*  CREATININE 1.75*  --   1.80* 1.90*  CALCIUM 9.0  --  9.4 9.6  GLUCOSE 207*  --  133* 184*   Vitamin B12 1,030 Folate wnl  Imaging/Diagnostic Tests: ECHO: Study Conclusions: - Left ventricle: The cavity size was normal. Wall thickness was increased in a pattern of moderate LVH. Systolic function was normal. The estimated ejection fraction was in the range of 45% to 50%. - Aortic valve: Trileaflet; normal thickness, mildly calcified   leaflets. - Mitral valve: Mildly thickened, moderately calcified leaflets . - Left atrium: The appendage was severely dilated. - Right ventricle: The cavity size was dilated. Wall thickness was normal. Systolic function was reduced. - Right atrium: The atrium was severely dilated. - Pulmonary arteries: Systolic pressure was moderately increased. PA peak pressure: 52 mm Hg (S).  Dg Chest 2 View  Result Date: 03/09/2018 CLINICAL DATA:  Shortness of breath and chest pain EXAM: CHEST - 2 VIEW COMPARISON:  August 31, 2017 FINDINGS: There is no edema or consolidation. Heart is mildly enlarged with pulmonary vascularity within normal limits. There is aortic atherosclerosis. No adenopathy. There is an old healed fracture of the left clavicle. IMPRESSION: Cardiomegaly.  Aortic atherosclerosis.  No edema or consolidation. Aortic Atherosclerosis (ICD10-I70.0). Electronically Signed   By: Bretta Bang III M.D.   On: 03/09/2018 13:30   Bashir Marchetti, [place], DO 03/17/2018, 8:11 AM PGY-1, [facility] Family Medicine FPTS Intern pager: [phone], text pages welcome

## 2018-03-17 NOTE — Clinical Social Work Placement (Signed)
   CLINICAL SOCIAL WORK PLACEMENT  NOTE  Date:  03/17/2018  Patient Details  Name: Sylvia Craig MRN: [id number] Date of Birth: 22-Aug-1951  Clinical Social Work is seeking post-discharge placement for this patient at the Skilled  Nursing Facility level of care (*CSW will initial, date and re-position this form in  chart as items are completed):  Yes   Patient/family provided with [place] Clinical Social Work Department's list of facilities offering this level of care within the geographic area requested by the patient (or if unable, by the patient's family).  Yes   Patient/family informed of their freedom to choose among providers that offer the needed level of care, that participate in Medicare, Medicaid or managed care program needed by the patient, have an available bed and are willing to accept the patient.  Yes   Patient/family informed of [facility] ownership interest in [facility] and [facility], as well as of the fact that they are under no obligation to receive care at these facilities.  PASRR submitted to EDS on       PASRR number received on       Existing PASRR number confirmed on 03/17/18     FL2 transmitted to all facilities in geographic area requested by pt/family on 03/17/18     FL2 transmitted to all facilities within larger geographic area on       Patient informed that his/her managed care company has contracts with or will negotiate with certain facilities, including the following:            Patient/family informed of bed offers received.  [facility]     Physician recommends and patient chooses bed at      [facility].  Patient to be transferred to facility by [facility]     Patient family notified on 03/17/18 of transfer.  Name of family member notified:  son was in the room     PHYSICIAN       Additional Comment:     _______________________________________________ Althea CharonAshley C Barbarita Hutmacher, LCSW 03/17/2018, 3:01 PM

## 2018-03-21 ENCOUNTER — Ambulatory Visit: Payer: Medicare Other | Admitting: Family Medicine

## 2018-04-03 ENCOUNTER — Telehealth: Payer: Self-pay

## 2018-04-03 NOTE — Telephone Encounter (Signed)
Verbal order given as requested. 

## 2018-04-03 NOTE — Telephone Encounter (Signed)
Eric, physical therapist with [facility] called requesting verbal orders for PT:  2x/week x 8 weeks for balance, ambulation training, transfer safety and activity tolerance.   Call back is [phone]. Ples SpecterAlisa Trygve Thal, RN [facility] RN)

## 2018-04-03 NOTE — Telephone Encounter (Signed)
I also received a fax [facility]..  Provided order to check INR on that fax.

## 2018-04-03 NOTE — Telephone Encounter (Signed)
Patient called stating [facility] is [facility] and that they need an order for her to have her INR checked at home by them. Her call back [phone] Shawna OrleansMeredith B Thomsen, RN

## 2018-04-04 ENCOUNTER — Telehealth: Payer: Self-pay

## 2018-04-04 ENCOUNTER — Encounter: Payer: Self-pay | Admitting: Nurse Practitioner

## 2018-04-04 NOTE — Telephone Encounter (Signed)
Harriett SineNancy- OT with [facility] calling for VO to see patient 1x week for 1 week, 2x week for 4 weeks. Also requesting order for a home health aid to come 1x week for 1 week, 2x week for 4 weeks. Her call back [phone] Shawna OrleansMeredith B Arcola Freshour, RN

## 2018-04-04 NOTE — Telephone Encounter (Signed)
Left message giving verbal order as requested.

## 2018-04-05 ENCOUNTER — Telehealth: Payer: Self-pay

## 2018-04-05 ENCOUNTER — Other Ambulatory Visit: Payer: Self-pay | Admitting: Family Medicine

## 2018-04-05 LAB — POCT INR: INR: 2.5

## 2018-04-05 NOTE — Telephone Encounter (Signed)
Pt called nurse line- states she has been in rehab and they have been adjusting her warfarin. They told her she is to be taking 5mg  daily. It looks like in the past 5mg  was too much for her, and we had her on 3mg  most recently. Pt is calling for a refill of 5mg  warfarin. Will forward to MD for review prior to refill. Shawna OrleansMeredith B Thomsen, RN

## 2018-04-06 MED ORDER — WARFARIN SODIUM 5 MG PO TABS: 5.0000 mg | ORAL_TABLET | Freq: Every day | ORAL | 3 refills | Status: DC

## 2018-04-06 NOTE — Telephone Encounter (Signed)
Called patient and verified that she is taking 5 mg daily.

## 2018-04-12 ENCOUNTER — Ambulatory Visit: Payer: Medicare Other | Admitting: Nurse Practitioner

## 2018-04-13 ENCOUNTER — Ambulatory Visit: Payer: Medicare Other | Admitting: Cardiology

## 2018-04-13 ENCOUNTER — Encounter: Payer: Self-pay | Admitting: Cardiology

## 2018-04-13 VITALS — BP 110/82 | HR 84 | Ht 65.0 in | Wt 249.8 lb

## 2018-04-13 DIAGNOSIS — Z79899 Other long term (current) drug therapy: Secondary | ICD-10-CM | POA: Diagnosis not present

## 2018-04-13 NOTE — Progress Notes (Signed)
04/13/2018 Sylvia Craig   [birth year]  [id number]  Primary Physician Leveda Anna Santiago Bumpers, MD Primary Cardiologist: Dr. Katrinka Blazing   Reason for Visit/CC: [facility] f/u for Acute on Chronic Respiratory Failure 2/2 obesity hypoventilation syndrome and acute on chronic combined systolic and diastolic HF.   HPI: Sylvia Craig is a 67 y.o. female, followed by Dr. Katrinka Blazing, witha h/o HTN, hypothyroidism, HLD, [name], IDDM,CKD stageIII-IV,OSA, chronnic combined systolic and diastolicheart failure,chronic respiratory failure requiring home supplemental O2 and atrial fibrillation. She is on chronic coumadin therapy. In 2015, she had a LHC at [facility] which showed three vessel disease. RCA and LCx occlusions with decent collaterals, D1 severe disease, LAD moderate diffuse disease not suitable for PCI. Medical therapy elected.   She was recently admitted to [facility] by Internal Medicine on 3/11 and discharged on 03/09/18. She presented with worsening dyspnea and AMS and was found to have chronic compensated respiratory acidosis on arrival.  Pulmonology was consulted and followed along with patient.  They believe that her chronic compensated respiratory acidosis is from obesity hypoventilation syndrome. She required BiPAP and condition improved. She was also felt to be in acute HF and required diuresis with IV Lasix and later transitioned to PO torsemide 20 mg daily. She did have AKI w/ SCr spiking to 2.34, however renal function gradually improved and SCr day of discharge was 1.90.  2D echo was also performed and showed reduced LVEF, at 45-50% (unchanged from echo 08/2017), as well as a dilated LA, felt likely 2/2 OHS. Her TSH was also noted to be elevated and her synthroid dose was adjusted to 137 mcg.   She presents to clinic for post hospital. She reports that she has done well. She is back to her baseline supplemental O2 requirements, 2L/min. She has not had to increase this. She was transitioned from hospital to SNF x 2 weeks  for rehab. She is back at home and getting home health PT/OT. She continues to do well. No chest pain. She has been compliant with a heart healthy/ low sodium diet and checking weight daily. Weight has remained constant ~245-247 lb. She knows to call Dr. Katrinka Blazing if > 3 lb weight gain in 24 hrs. Pt notes that since leaving the hospital, she has been using a CPAP nightly.    Current Meds  Medication Sig  . acetaminophen (TYLENOL) 500 MG tablet Take 1,000 mg by mouth every 6 (six) hours as needed for moderate pain or headache.   . cholecalciferol (VITAMIN D) 1000 units tablet TAKE 1 TABLET (1,000 UNITS TOTAL) BY MOUTH AT BEDTIME.  . insulin aspart (NOVOLOG) 100 UNIT/ML injection Inject 0-9 Units into the skin 3 (three) times daily with meals.  . insulin aspart (NOVOLOG) 100 UNIT/ML injection Inject 9 Units into the skin 3 (three) times daily before meals. (Patient taking differently: Inject 4-6 Units into the skin 3 (three) times daily before meals. Inject 4-6 units into the skin 3 times a day after meals, per sliding scale)  . insulin glargine (LANTUS) 100 UNIT/ML injection Inject 0.12 mLs (12 Units total) into the skin at bedtime.  . insulin glargine (LANTUS) 100 UNIT/ML injection Inject 0.06 mLs (6 Units total) into the skin at bedtime.  Marland Kitchen levothyroxine (SYNTHROID, LEVOTHROID) 150 MCG tablet Take 1 tablet (150 mcg total) by mouth daily before breakfast.  . metoprolol succinate (TOPROL-XL) 100 MG 24 hr tablet Take 1 tablet (100 mg total) by mouth daily.  . Multiple Vitamin (MULTIVITAMIN WITH MINERALS) TABS tablet Take 1 tablet by mouth  daily.  . nitroGLYCERIN (NITROSTAT) 0.4 MG SL tablet Take 0.4 mg under the toungue as needed for chest pain (Patient taking differently: Place 0.4 mg under the tongue every 5 (five) minutes as needed for chest pain. )  . rosuvastatin (CRESTOR) 10 MG tablet Take 1 tablet (10 mg total) by mouth at bedtime.  . senna-docusate (SENOKOT-S) 8.6-50 MG tablet Take 1 tablet by  mouth at bedtime as needed for mild constipation.  . torsemide (DEMADEX) 20 MG tablet Take 1 tablet (20 mg total) by mouth daily.  . vitamin C (ASCORBIC ACID) 500 MG tablet Take 2 tablets (1,000 mg total) by mouth at bedtime.  Marland Kitchen warfarin (COUMADIN) 3 MG tablet 3 mg daily (Patient taking differently: Take 3 mg by mouth at bedtime. )  . warfarin (COUMADIN) 5 MG tablet Take 1 tablet (5 mg total) by mouth daily.  Marland Kitchen zinc sulfate 220 (50 Zn) MG capsule Take 1 capsule (220 mg total) by mouth daily.   Allergies  Allergen Reactions  . Codeine Rash and Other (See Comments)    Edema, also  . Glucosamine Shortness Of Breath  . Other Shortness Of Breath and Other (See Comments)    Crab = chest tightness, also  . Oxycodone Rash and Other (See Comments)    Bad dreams, also  . Shellfish Allergy Shortness Of Breath  . Shellfish-Derived Products Shortness Of Breath  . Atorvastatin Other (See Comments)    Muscle aches  . Broccoli [Brassica Oleracea Italica] Other (See Comments)    Very limited intake due to being on Coumadin  . Lisinopril Other (See Comments) and Cough    "Made me hurt all over," also  . Oxycodone-Aspirin Rash and Other (See Comments)    Bad dreams, also   Past Medical History:  Diagnosis Date  . Acute respiratory failure (HCC)   . [name] (coronary artery disease), native coronary artery   . CHF (congestive heart failure) (HCC)   . CKD (chronic kidney disease), stage III (HCC)   . Diabetes mellitus without complication (HCC)   . Hyperlipidemia LDL goal <70   . Hypertension   . Iron (Fe) deficiency anemia   . Thyroid disease    Family History  Problem Relation Age of Onset  . Dementia Mother   . [name] Mother   . Heart disease Mother   . Hypertension Mother   . Stroke Mother   . Cancer Father   . Sudden Cardiac Death Neg Hx    Past Surgical History:  Procedure Laterality Date  . APPLICATION OF A-CELL OF EXTREMITY Left 07/27/2017   Procedure: APPLICATION OF A-CELL;  Surgeon:  Peggye Form, DO;  Location: [facility] OR;  Service: Plastics;  Laterality: Left;  . CESAREAN SECTION    . I&D EXTREMITY Left 07/24/2017   Procedure: IRRIGATION AND DEBRIDEMENT EXTREMITY;  Surgeon: Sheral Apley, MD;  Location: [facility] OR;  Service: Orthopedics;  Laterality: Left;  . I&D EXTREMITY Left 07/27/2017   Procedure: IRRIGATION AND DEBRIDEMENT OF LEFT HAND;  Surgeon: Peggye Form, DO;  Location: [facility] OR;  Service: Plastics;  Laterality: Left;   Social History   Socioeconomic History  . Marital status: Legally Separated    Spouse name: Not on file  . Number of children: Not on file  . Years of education: Not on file  . Highest education level: Not on file  Occupational History  . Not on file  Social Needs  . Financial resource strain: Not on file  . Food insecurity:  Worry: Not on file    Inability: Not on file  . Transportation needs:    Medical: Not on file    Non-medical: Not on file  Tobacco Use  . Smoking status: Former Smoker    Packs/day: 1.00    Types: Cigarettes    Last attempt to quit: 02/24/1988    Years since quitting: 30.1  . Smokeless tobacco: Never Used  Substance and Sexual Activity  . Alcohol use: No  . Drug use: No  . Sexual activity: Not on file  Lifestyle  . Physical activity:    Days per week: Not on file    Minutes per session: Not on file  . Stress: Not on file  Relationships  . Social connections:    Talks on phone: Not on file    Gets together: Not on file    Attends religious service: Not on file    Active member of club or organization: Not on file    Attends meetings of clubs or organizations: Not on file    Relationship status: Not on file  . Intimate partner violence:    Fear of current or ex partner: Not on file    Emotionally abused: Not on file    Physically abused: Not on file    Forced sexual activity: Not on file  Other Topics Concern  . Not on file  Social History Narrative  . Not on file     Review of  Systems: General: negative for chills, fever, Craig sweats or weight changes.  Cardiovascular: negative for chest pain, dyspnea on exertion, edema, orthopnea, palpitations, paroxysmal nocturnal dyspnea or shortness of breath Dermatological: negative for rash Respiratory: negative for cough or wheezing Urologic: negative for hematuria Abdominal: negative for nausea, vomiting, diarrhea, bright red blood per rectum, melena, or hematemesis Neurologic: negative for visual changes, syncope, or dizziness All other systems reviewed and are otherwise negative except as noted above.   Physical Exam:  Height 5\' 5"  (1.651 m), weight 249 lb 12.8 oz (113.3 kg).  General appearance: alert, cooperative, no distress and moderately obese Neck: no carotid bruit and no JVD Lungs: clear to auscultation bilaterally Heart: regular rate and rhythm, S1, S2 normal, no murmur, click, rub or gallop Extremities: Trace- 1+ bilateral LEE Pulses: 2+ and symmetric Skin: Skin color, texture, turgor normal. No rashes or lesions Neurologic: Grossly normal  EKG not performed -- personally reviewed   ASSESSMENT AND PLAN:   1. Combined Systolic and Diastolic HF: hospital echo showed mild LV dysfunction with EF of 45-50% (unchanged from echo 08/2017). She is now on PO torsemide. Weights have remained stable since hospital discharge. She denies any change in respiratory status since hospital d/c. Continue Torsemide, 20 mg daily. Will get repeat echo today to reassess renal function and electrolytes. We discussed importance of low sodium diet and daily weights. She knows to call Dr. Katrinka Blazing if > 3 lb weight gain in 24 hrs.  2. Obesity Hyperventilation Syndrome: on chronic home O2, 2L.min and also on CPAP nightly.   3. [name]: In 2015, she had a LHC at [facility] which showed three vessel disease. RCA and LCx occlusions with decent collaterals, D1 severe disease, LAD moderate diffuse disease not suitable for PCI. Medical therapy elected.    4. Atrial Fibrillation: rate is controlled. She is on chronic coumadin for a/c.   5. Hypothyroidism: on synthroid. Followed by PCP.   6. IDDM: followed by PCP.   7. HTN: controlled on current regimen.   8. CKD:  will get repeat BMP today to recheck renal function, after starting PO torsemide.    Follow-Up: 6 months w/ Dr. Joella PrinceSmith   Dania Marsan PA-C, [facility] 04/13/2018 11:50 AM

## 2018-04-13 NOTE — Patient Instructions (Addendum)
Medication Instructions:   Your physician recommends that you continue on your current medications as directed. Please refer to the Current Medication list given to you today.    If you need a refill on your cardiac medications before your next appointment, please call your pharmacy.  Labwork:  BMET TODAY    Testing/Procedures: NONE ORDERED  TODAY    Follow-Up: WITH DR Katrinka Blazing IN  JULY   Any Other Special Instructions Will Be Listed Below (If Applicable).  CONTACT CLINIC IF YOU HAVE GRAINED 3LBS IN 2 HOURS OR 5LBS IN A WEEK   Low-Sodium Eating Plan Sodium, which is an element that makes up salt, helps you maintain a healthy balance of fluids in your body. Too much sodium can increase your blood pressure and cause fluid and waste to be held in your body. Your health care provider or dietitian may recommend following this plan if you have high blood pressure (hypertension), kidney disease, liver disease, or heart failure. Eating less sodium can help lower your blood pressure, reduce swelling, and protect your heart, liver, and kidneys. What are tips for following this plan? General guidelines  Most people on this plan should limit their sodium intake to 1,500-2,000 mg (milligrams) of sodium each day. Reading food labels  The Nutrition Facts label lists the amount of sodium in one serving of the food. If you eat more than one serving, you must multiply the listed amount of sodium by the number of servings.  Choose foods with less than 140 mg of sodium per serving.  Avoid foods with 300 mg of sodium or more per serving. Shopping  Look for lower-sodium products, often labeled as "low-sodium" or "no salt added."  Always check the sodium content even if foods are labeled as "unsalted" or "no salt added".  Buy fresh foods. ? Avoid canned foods and premade or frozen meals. ? Avoid canned, cured, or processed meats  Buy breads that have less than 80 mg of sodium per  slice. Cooking  Eat more home-cooked food and less restaurant, buffet, and fast food.  Avoid adding salt when cooking. Use salt-free seasonings or herbs instead of table salt or sea salt. Check with your health care provider or pharmacist before using salt substitutes.  Cook with plant-based oils, such as canola, sunflower, or olive oil. Meal planning  When eating at a restaurant, ask that your food be prepared with less salt or no salt, if possible.  Avoid foods that contain MSG (monosodium glutamate). MSG is sometimes added to [place] food, bouillon, and some canned foods. What foods are recommended? The items listed may not be a complete list. Talk with your dietitian about what dietary choices are best for you. Grains Low-sodium cereals, including oats, puffed wheat and rice, and shredded wheat. Low-sodium crackers. Unsalted rice. Unsalted pasta. Low-sodium bread. Whole-grain breads and whole-grain pasta. Vegetables Fresh or frozen vegetables. "No salt added" canned vegetables. "No salt added" tomato sauce and paste. Low-sodium or reduced-sodium tomato and vegetable juice. Fruits Fresh, frozen, or canned fruit. Fruit juice. Meats and other protein foods Fresh or frozen (no salt added) meat, poultry, seafood, and fish. Low-sodium canned tuna and salmon. Unsalted nuts. Dried peas, beans, and lentils without added salt. Unsalted canned beans. Eggs. Unsalted nut butters. Dairy Milk. Soy milk. Cheese that is naturally low in sodium, such as ricotta cheese, fresh mozzarella, or Swiss cheese Low-sodium or reduced-sodium cheese. Cream cheese. Yogurt. Fats and oils Unsalted butter. Unsalted margarine with no trans fat. Vegetable oils such as  canola or olive oils. Seasonings and other foods Fresh and dried herbs and spices. Salt-free seasonings. Low-sodium mustard and ketchup. Sodium-free salad dressing. Sodium-free light mayonnaise. Fresh or refrigerated horseradish. Lemon juice. Vinegar.  Homemade, reduced-sodium, or low-sodium soups. Unsalted popcorn and pretzels. Low-salt or salt-free chips. What foods are not recommended? The items listed may not be a complete list. Talk with your dietitian about what dietary choices are best for you. Grains Instant hot cereals. Bread stuffing, pancake, and biscuit mixes. Croutons. Seasoned rice or pasta mixes. Noodle soup cups. Boxed or frozen macaroni and cheese. Regular salted crackers. Self-rising flour. Vegetables Sauerkraut, pickled vegetables, and relishes. Olives. [place] fries. Onion rings. Regular canned vegetables (not low-sodium or reduced-sodium). Regular canned tomato sauce and paste (not low-sodium or reduced-sodium). Regular tomato and vegetable juice (not low-sodium or reduced-sodium). Frozen vegetables in sauces. Meats and other protein foods Meat or fish that is salted, canned, smoked, spiced, or pickled. Bacon, ham, sausage, hotdogs, corned beef, chipped beef, packaged lunch meats, salt pork, jerky, pickled herring, anchovies, regular canned tuna, sardines, salted nuts. Dairy Processed cheese and cheese spreads. Cheese curds. Blue cheese. Feta cheese. String cheese. Regular cottage cheese. Buttermilk. Canned milk. Fats and oils Salted butter. Regular margarine. Ghee. Bacon fat. Seasonings and other foods Onion salt, garlic salt, seasoned salt, table salt, and sea salt. Canned and packaged gravies. Worcestershire sauce. Tartar sauce. Barbecue sauce. Teriyaki sauce. Soy sauce, including reduced-sodium. Steak sauce. Fish sauce. Oyster sauce. Cocktail sauce. Horseradish that you find on the shelf. Regular ketchup and mustard. Meat flavorings and tenderizers. Bouillon cubes. Hot sauce and Tabasco sauce. Premade or packaged marinades. Premade or packaged taco seasonings. Relishes. Regular salad dressings. Salsa. Potato and tortilla chips. Corn chips and puffs. Salted popcorn and pretzels. Canned or dried soups. Pizza. Frozen entrees and  pot pies. Summary  Eating less sodium can help lower your blood pressure, reduce swelling, and protect your heart, liver, and kidneys.  Most people on this plan should limit their sodium intake to 1,500-2,000 mg (milligrams) of sodium each day.  Canned, boxed, and frozen foods are high in sodium. Restaurant foods, fast foods, and pizza are also very high in sodium. You also get sodium by adding salt to food.  Try to cook at home, eat more fresh fruits and vegetables, and eat less fast food, canned, processed, or prepared foods. This information is not intended to replace advice given to you by your health care provider. Make sure you discuss any questions you have with your health care provider. Document Released: 06/04/2002 Document Revised: 12/06/2016 Document Reviewed: 12/06/2016 Elsevier Interactive Patient Education  [employer]2018 Elsevier Inc.

## 2018-04-14 LAB — BASIC METABOLIC PANEL
BUN/Creatinine Ratio: 40 — ABNORMAL HIGH (ref 12–28)
BUN: 66 mg/dL — ABNORMAL HIGH (ref 8–27)
CO2: 30 mmol/L — ABNORMAL HIGH (ref 20–29)
Calcium: 9.9 mg/dL (ref 8.7–10.3)
Chloride: 101 mmol/L (ref 96–106)
Creatinine, Ser: 1.64 mg/dL — ABNORMAL HIGH (ref 0.57–1.00)
GFR calc Af Amer: 37 mL/min/{1.73_m2} — ABNORMAL LOW (ref >59)
GFR, EST NON AFRICAN AMERICAN: 32 mL/min/{1.73_m2} — AB (ref >59)
Glucose: 88 mg/dL (ref 65–99)
POTASSIUM: 4.9 mmol/L (ref 3.5–5.2)
Sodium: 147 mmol/L — ABNORMAL HIGH (ref 134–144)

## 2018-04-18 NOTE — Progress Notes (Signed)
Pt has been made aware of normal result and verbalized understanding.  jw 04/18/18

## 2018-04-19 ENCOUNTER — Other Ambulatory Visit: Payer: Self-pay | Admitting: *Deleted

## 2018-04-19 ENCOUNTER — Telehealth: Payer: Self-pay | Admitting: Family Medicine

## 2018-04-19 ENCOUNTER — Encounter: Payer: Self-pay | Admitting: Family Medicine

## 2018-04-19 ENCOUNTER — Other Ambulatory Visit: Payer: Self-pay

## 2018-04-19 ENCOUNTER — Ambulatory Visit: Payer: Medicare Other | Admitting: Family Medicine

## 2018-04-19 VITALS — BP 108/62 | HR 104 | Temp 97.6°F | Ht 65.0 in | Wt 248.4 lb

## 2018-04-19 DIAGNOSIS — I5042 Chronic combined systolic (congestive) and diastolic (congestive) heart failure: Secondary | ICD-10-CM

## 2018-04-19 DIAGNOSIS — I872 Venous insufficiency (chronic) (peripheral): Secondary | ICD-10-CM

## 2018-04-19 DIAGNOSIS — N183 Chronic kidney disease, stage 3 unspecified: Secondary | ICD-10-CM

## 2018-04-19 DIAGNOSIS — E1122 Type 2 diabetes mellitus with diabetic chronic kidney disease: Secondary | ICD-10-CM

## 2018-04-19 DIAGNOSIS — I4891 Unspecified atrial fibrillation: Secondary | ICD-10-CM | POA: Diagnosis not present

## 2018-04-19 DIAGNOSIS — R2681 Unsteadiness on feet: Secondary | ICD-10-CM | POA: Diagnosis not present

## 2018-04-19 DIAGNOSIS — I482 Chronic atrial fibrillation, unspecified: Secondary | ICD-10-CM

## 2018-04-19 DIAGNOSIS — E662 Morbid (severe) obesity with alveolar hypoventilation: Secondary | ICD-10-CM | POA: Diagnosis not present

## 2018-04-19 DIAGNOSIS — Z794 Long term (current) use of insulin: Secondary | ICD-10-CM | POA: Diagnosis not present

## 2018-04-19 DIAGNOSIS — Z23 Encounter for immunization: Secondary | ICD-10-CM

## 2018-04-19 DIAGNOSIS — G4733 Obstructive sleep apnea (adult) (pediatric): Secondary | ICD-10-CM | POA: Diagnosis not present

## 2018-04-19 LAB — POCT INR: INR: >5

## 2018-04-19 MED ORDER — INSULIN GLARGINE 100 UNIT/ML ~~LOC~~ SOLN: 14.0000 [IU] | Freq: Every day | SUBCUTANEOUS | Status: AC

## 2018-04-19 MED ORDER — WARFARIN SODIUM 3 MG PO TABS: ORAL_TABLET | ORAL | 3 refills | Status: AC

## 2018-04-19 NOTE — Assessment & Plan Note (Addendum)
Well-controlled based on most recent Hemoglobin A1c of 6.6 on 02/21/18. Multiple (3-4) morning glucose readings below 60 over past 2 weeks indicate that Lantus dose is too high.  - Adjust Lantus dose from 16 units down to 14 units - Diabetic foot exam performed in office today - Discussed calling local ophthalmologist to schedule eye exam with patient

## 2018-04-19 NOTE — Assessment & Plan Note (Addendum)
Well-controlled. Patient saw cardiologist (Dr. Katrinka BlazingSmith) on 04/13/18. Weight has been stable since D/C from SNF, and patient has had no symptoms suggestive of volume overload or acute exacerbation. - Continue 20 mg torsemide daily - Advised patient to take an extra 20 mg torsemide if weight increases to 5 lbs above baseline or higher  No changes in current regimen.  Cont daily wts.

## 2018-04-19 NOTE — Assessment & Plan Note (Addendum)
Patient has lost significant amount of weight since September 2018. Some of weight loss can be attributed to diuresis of extra fluid, but decrease in weight is also due to dietary and lifestyle changes by patient. She is very motivated to keep losing weight with dietary adjustments and increased physical activity. - Encouraged patient to continue following healthy diet - Referral to dietitian (Dr. Gerilyn PilgrimSykes) made per patient's request

## 2018-04-19 NOTE — Patient Instructions (Addendum)
If your weight increases 5 lbs above your baseline, take an extra torsemide that day. Decrease your lantus insulin to 14 units each night. No coumadin today or tomorrow. Starting Friday, 3 mg coumadin per day. Recheck in one week. I refilled your coumadin 3 mg.   There are several good opthalmologists in town.  I use Dr. Dione BoozeGroat.  Please make an eye appointment soon.    Next visit we will do a diabetic urine check.   Have your pharmacy send refill requests to my office.   I want you to see Dr. Gerilyn PilgrimSykes, our nutritionist at your convenience

## 2018-04-19 NOTE — Assessment & Plan Note (Addendum)
INR of 7.4 today indicates need to adjust Coumadin dose. Patient instructed to hold Coumadin today and tomorrow, then restart with 3 mg dose on Friday, 4/26. 5 mg Coumadin discontinued and 3 mg dose reordered for patient to pick up at pharmacy. Will recheck INR in 1 week. 3 mg daily was her stable, prehospitalization dose.

## 2018-04-19 NOTE — Telephone Encounter (Signed)
**  After Hours/ Emergency Line Call*  Received a call to report that Sylvia Craig 's [facility] home RN stating INR venipuncture 6.66 as critical lab. Looks like she was seen today with Dr. Leveda AnnaHensel. Discussed Dr. Cyndia SkeetersHensel's plan according to his patient instructions today - no coumadin today or tomorrow, then restart 3mg  per day and recheck on 5/1. She states labs had been faxed to [facility], but she will fax them to us now. Will forward to PCP.  Loni MuseKate Kanchan Gal, MD PGY-2, [facility]

## 2018-04-19 NOTE — Progress Notes (Signed)
Date of Visit: 04/19/2018   HPI:  [name] is a 67 y.o. Woman who presents for follow up of chronic conditions following discharge from SNF on 03/31/18. Patient stayed in SNF from 3/22 to 4/5 following hospitalization for shortness of breath and altered mental status attributed to chronic compensated respiratory acidosis 2/2 OHS. Since SNF discharge, patient has been doing well. She has been working with in-home PT and OT 2x per week to improve her arm strength, balance, and endurance. She also has a home health aide come to assist her with ADLs and IADLs. Chronic conditions discussed today include:  Type 2 diabetes Patient has been following new sliding scale dosing of short-acting insulin before meals. She consistently checks her BG level each morning and 3-4 times throughout the day. Over the past 2 weeks, her morning fasting glucose has averaged 108-115, but she has had 3-4 readings of <60. She is unsure whether she has ever had symptoms such as confusion, fatigue, or shakiness associated with low BG since these measures are early in the morning when she doesn't feel fully awake.   Chronic combined systolic and diastolic heart failure Patient has had no new shortness of breath or chest pain since discharge from SNF. She feels like her endurance and conditioning has improved as she's worked with PT and OT over the past 2 weeks. She weighs herself every morning and her weight has been stable, generally fluctuating <1-3 lbs.  Obesity/Obesity hypoventilation syndrome Patient has been using BiPAP at night since hospital D/C and has been sleeping well with this. Patient also expressed a goal of no longer needing supplemental oxygen and understands that additional weight loss will help her move closer to this goal. She has been eating healthier since discharge from the SNF and is interested in meeting with a dietitian to help optimize her diet.  ROS: See HPI.  PMH: [name], OSA, OHS, CKD  PHYSICAL  EXAM: BP 108/62   Pulse (!) 104   Temp 97.6 F (36.4 C) (Oral)   Ht 5\' 5"  (1.651 m)   Wt 248 lb 6.4 oz (112.7 kg)   SpO2 96%   BMI 41.34 kg/m  Gen: Well-appearing, cooperative, no acute distress Heart: RRR, no murmurs/rubs/gallops Lungs: CTAB, normal work of breathing Neuro: grossly Ext: Hyperpigmentation and induration of skin below knee bilaterally; 2+ radial and dorsalis pedis pulses  ASSESSMENT/PLAN:  Health maintenance:  - Pneumococcal polysaccharide vaccine 23-valent received today - Patient will schedule eye exam with local ophthalmologist - Patient signed release of information to have [facility] - Urine microalbumin will be checked at patient's next visit - Discussed need for a mammogram and DEXA scan with patient  Chronic atrial fibrillation (HCC) INR of 7.4 today indicates need to adjust Coumadin dose. Patient instructed to hold Coumadin today and tomorrow, then restart with 3 mg dose on Friday, 4/26. 5 mg Coumadin discontinued and 3 mg dose reordered for patient to pick up at pharmacy. Will recheck INR in 1 week.  Morbid obesity (HCC) Patient has lost significant amount of weight since September 2018. Some of weight loss can be attributed to diuresis of extra fluid, but decrease in weight is also due to dietary and lifestyle changes by patient. She is very motivated to keep losing weight with dietary adjustments and increased physical activity. - Encouraged patient to continue following healthy diet - Referral to dietitian (Dr. Gerilyn PilgrimSykes) made per patient's request   Type 2 diabetes mellitus with chronic kidney disease, with long-term  current use of insulin (HCC) Well-controlled based on most recent Hemoglobin A1c of 6.6 on 02/21/18. Multiple (3-4) morning glucose readings below 60 over past 2 weeks indicate that Lantus dose is too high.  - Adjust Lantus dose from 16 units down to 14 units - Diabetic foot exam performed in office today -  Discussed calling local ophthalmologist to schedule eye exam with patient   Chronic combined systolic and diastolic heart failure (HCC) Well-controlled. Patient saw cardiologist (Dr. Katrinka Blazing) on 04/13/18. Weight has been stable since D/C from SNF, and patient has had no symptoms suggestive of volume overload or acute exacerbation. - Continue 20 mg torsemide daily - Advised patient to take an extra 20 mg torsemide if weight increases to 5 lbs above baseline or higher   FOLLOW UP: -Recheck INR in one week. -Come back for an office visit in 1 month for diabetic urine check and follow up on above conditions.

## 2018-04-20 ENCOUNTER — Encounter: Payer: Self-pay | Admitting: Family Medicine

## 2018-04-21 NOTE — Assessment & Plan Note (Signed)
Creat recently measured.  AKI has resolved.  Seems to be at her baseline on current med regimen.

## 2018-04-21 NOTE — Assessment & Plan Note (Signed)
Doing OK at home with walker.

## 2018-04-21 NOTE — Assessment & Plan Note (Signed)
Bipap at night has improved this problem

## 2018-04-21 NOTE — Assessment & Plan Note (Signed)
Improved with wt loss and diuresis.  Bipap at night has been quite helpful.

## 2018-04-21 NOTE — Assessment & Plan Note (Signed)
Improved, no skin breakdown.  Does have chronic redness and still 2+ edema

## 2018-04-26 ENCOUNTER — Other Ambulatory Visit: Payer: Self-pay | Admitting: *Deleted

## 2018-04-26 LAB — POCT INR: INR: 2.8

## 2018-05-03 ENCOUNTER — Other Ambulatory Visit: Payer: Self-pay | Admitting: *Deleted

## 2018-05-03 LAB — POCT INR: INR: 2.5

## 2018-05-08 ENCOUNTER — Telehealth: Payer: Self-pay

## 2018-05-08 NOTE — Telephone Encounter (Signed)
Scott, RN with [facility] called for the following verbal orders:  Extend HH aide for 2 x/week x 3 more weeks.  Call back is [phone]  Ples Specter, RN [facility] RN)

## 2018-05-09 NOTE — Telephone Encounter (Signed)
Verbal order given as requested. 

## 2018-05-15 ENCOUNTER — Telehealth: Payer: Self-pay

## 2018-05-15 ENCOUNTER — Other Ambulatory Visit: Payer: Self-pay | Admitting: Family Medicine

## 2018-05-15 DIAGNOSIS — Z09 Encounter for follow-up examination after completed treatment for conditions other than malignant neoplasm: Secondary | ICD-10-CM

## 2018-05-15 NOTE — Telephone Encounter (Signed)
I have tried twice to call, no answer and mailbox is full.  I am happy to give a verbal order for services requested when [place] calls back.

## 2018-05-15 NOTE — Telephone Encounter (Signed)
Harriett Sine- OT with [facility] calling for verbal orders to extend OT. 2x week for 4 weeks Call back [phone] Shawna Orleans, RN

## 2018-05-17 ENCOUNTER — Other Ambulatory Visit (INDEPENDENT_AMBULATORY_CARE_PROVIDER_SITE_OTHER): Payer: Medicare Other | Admitting: *Deleted

## 2018-05-17 LAB — POCT INR: INR: 5.2 — AB (ref 2.0–3.0)

## 2018-05-23 ENCOUNTER — Telehealth: Payer: Self-pay

## 2018-05-23 ENCOUNTER — Other Ambulatory Visit (INDEPENDENT_AMBULATORY_CARE_PROVIDER_SITE_OTHER): Payer: Medicare Other | Admitting: *Deleted

## 2018-05-23 LAB — POCT INR: INR: 1.7 — AB (ref 2.0–3.0)

## 2018-05-23 NOTE — Telephone Encounter (Signed)
Orders given as requested.  She has an appointment with me tomorrow.

## 2018-05-23 NOTE — Telephone Encounter (Signed)
Scott- RN with [facility] calling for re certification orders to see patient 2x week for 4 weeks, 1x week for 5 weeks for wound care and Unna wraps. Also calling to inform MD todays INR was 1.7 Call back for VO [phone] Shawna Orleans, RN

## 2018-05-24 ENCOUNTER — Other Ambulatory Visit: Payer: Self-pay

## 2018-05-24 ENCOUNTER — Ambulatory Visit (INDEPENDENT_AMBULATORY_CARE_PROVIDER_SITE_OTHER): Payer: Medicare Other | Admitting: Family Medicine

## 2018-05-24 ENCOUNTER — Encounter: Payer: Self-pay | Admitting: Family Medicine

## 2018-05-24 VITALS — BP 118/72 | HR 121 | Temp 97.6°F | Ht 65.0 in | Wt 259.6 lb

## 2018-05-24 DIAGNOSIS — Z794 Long term (current) use of insulin: Secondary | ICD-10-CM | POA: Diagnosis not present

## 2018-05-24 DIAGNOSIS — N183 Chronic kidney disease, stage 3 unspecified: Secondary | ICD-10-CM

## 2018-05-24 DIAGNOSIS — E1122 Type 2 diabetes mellitus with diabetic chronic kidney disease: Secondary | ICD-10-CM

## 2018-05-24 DIAGNOSIS — I5042 Chronic combined systolic (congestive) and diastolic (congestive) heart failure: Secondary | ICD-10-CM

## 2018-05-24 DIAGNOSIS — I872 Venous insufficiency (chronic) (peripheral): Secondary | ICD-10-CM

## 2018-05-24 LAB — POCT GLYCOSYLATED HEMOGLOBIN (HGB A1C): HBA1C, POC (CONTROLLED DIABETIC RANGE): 7 % (ref 0.0–7.0)

## 2018-05-24 MED ORDER — TORSEMIDE 20 MG PO TABS: 20.0000 mg | ORAL_TABLET | Freq: Every day | ORAL | 3 refills | Status: AC

## 2018-05-24 NOTE — Assessment & Plan Note (Signed)
Relatively stable. Chronic erythema and 2+ edema present bilaterally. Very mild skin breakdown on right leg. - Approved request from home health RN for Unna wrap to help with LE edema

## 2018-05-24 NOTE — Assessment & Plan Note (Addendum)
Well controlled. Hemoglobin A1c was 7.0 today, up slightly from A1c of 6.6 on 02/21/18, but still below goal. Slight increase may be due to stress related to recent passing of her mother. Morning glucose readings have been running around 120-130 with no readings below 100.

## 2018-05-24 NOTE — Progress Notes (Signed)
Date of Visit: 05/24/2018   HPI:  [name] is a 67 y.o woman here today to discuss the following issues:  Fluid Retention - Has noticed that weight has slowly increased from 247 lbs to 263 lbs over past month - Has also had increasing shortness of breath - No issues with sleeping although sleeps with head of bed raised (and has for months) - Has been eating what is "quick and easy" to make since the passing of her mom in early April; wants to start being more mindful of diet moving forward - Has noticed mild increase in fluid in legs, particularly the right one - Home nurse suggested Unna boots for legs and she is interested in trying this - Ran out of torsemide 20 mg about a month ago and has been taking furosemide 40 mg instead; generally urinates within an hour of taking medication - Has taken an additional furosemide tablet occasionally when she notices more fluid in her legs or increased shortness of breath - Denies chest pain or tightness  Type 2 diabetes - Checks blood sugar several times per day; morning numbers have been running around 120-130 - No readings below 100 since adjusting Lantus dose from 16 units to 14 units on 04/19/18  - Generally only needing to use short acting insulin at night; a few times has not needed to use it at all   ROS: See HPI.  PMH: Type 2 diabetes, CHF, chronic venous insufficiency, OSA, Stage 3 CKD  PHYSICAL EXAM: BP 118/72   Pulse (!) 121   Temp 97.6 F (36.4 C) (Oral)   Ht  (1.651 m)   Wt 259 lb 9.6 oz (117.8 kg)   SpO2 96% Comment:   BMI 43.20 kg/m  Gen: Well appearing, sitting comfortably in chair, in no acute distress Heart: RRR, no murmurs/rubs/gallops Lungs: CTAB, normal work of breathing Neuro: grossly intact without focal findings Ext: Skin overlying LEs taut and erythematous bilaterally; a few small weeping lesions on right leg with serous exudate  ASSESSMENT/PLAN:  Health maintenance:  - Patient aware that she is  overdue for mammogram; intends to schedule this in the next few months  Type 2 diabetes mellitus with chronic kidney disease, with long-term current use of insulin (HCC) Well controlled. Hemoglobin A1c was 7.0 today, up slightly from A1c of 6.6 on 02/21/18, but still below goal. Slight increase may be due to stress related to recent passing of her mother. Morning glucose readings have been running around 120-130 with no readings below 100.  Chronic combined systolic and diastolic heart failure (HCC) Increasing weight, leg swelling, and shortness of breath suggest fluid retention 2/2 heart failure. - Given coincidence of uptrend in weight with medication change from torsemide to furosemide, will restart torsemide 20 mg daily - Advised patient to continue to check weight daily and call or return to office if weight does not begin trending back down - Encouraged limiting intake of high-sodium foods that promote fluid retention   Chronic venous insufficiency Relatively stable. Chronic erythema and 2+ edema present bilaterally. Very mild skin breakdown on right leg. - Approved request from home health RN for Unna wrap to help with LE edema    FOLLOW UP: Follow up in 1 month for chf  Gracy Bruins, Medical Student [facility]  I was either physically present or repeated all elements of the H &PE by [facility].  I agree with her documentation and management.  As best I can put the story  together, she was doing well on toresemide daily, ran out, switched back to her old lasix, and now has worsening fluid retention causing leg edema, shortness of breath and some lower leg oozing.  Agree with switching back to torsemide and FU one month.  Hopefully, this will get her back on track.

## 2018-05-24 NOTE — Assessment & Plan Note (Signed)
Increasing weight, leg swelling, and shortness of breath suggest fluid retention 2/2 heart failure. - Given coincidence of uptrend in weight with medication change from torsemide to furosemide, will restart torsemide 20 mg daily - Advised patient to continue to check weight daily and call or return to office if weight does not begin trending back down - Encouraged limiting intake of high-sodium foods that promote fluid retention

## 2018-05-24 NOTE — Patient Instructions (Addendum)
Lets only make one change: switch you back from the furosemide to the torsemide.  I sent in a new prescription. See me in one month.  I will do blood work next visit.

## 2018-05-25 ENCOUNTER — Encounter: Payer: Self-pay | Admitting: Family Medicine

## 2018-05-25 NOTE — Assessment & Plan Note (Signed)
Will check labs next visit 

## 2018-05-28 ENCOUNTER — Other Ambulatory Visit: Payer: Self-pay | Admitting: Family Medicine

## 2018-05-28 DIAGNOSIS — Z09 Encounter for follow-up examination after completed treatment for conditions other than malignant neoplasm: Secondary | ICD-10-CM

## 2018-05-28 DIAGNOSIS — I509 Heart failure, unspecified: Secondary | ICD-10-CM

## 2018-05-30 ENCOUNTER — Other Ambulatory Visit: Payer: Self-pay | Admitting: *Deleted

## 2018-05-30 LAB — POCT INR: INR: 3.3 — AB (ref 2.0–3.0)

## 2018-06-06 ENCOUNTER — Other Ambulatory Visit: Payer: Self-pay | Admitting: *Deleted

## 2018-06-06 LAB — POCT INR: INR: 4.6 — AB (ref 2.0–3.0)

## 2018-06-13 ENCOUNTER — Other Ambulatory Visit (INDEPENDENT_AMBULATORY_CARE_PROVIDER_SITE_OTHER): Payer: Medicare Other | Admitting: *Deleted

## 2018-06-13 LAB — POCT INR: INR: 2 (ref 2.0–3.0)

## 2018-06-15 ENCOUNTER — Telehealth: Payer: Self-pay | Admitting: *Deleted

## 2018-06-15 NOTE — Telephone Encounter (Signed)
Called.  Only sick x 2 days.  Exposed to grandson who had a "cold."  She has no dx of COPD.  No fever, chills or SOB.  States she is starting to get better.  No rx.  Observe.

## 2018-06-15 NOTE — Telephone Encounter (Signed)
Pt states that she has a productive cough with "white gooey" phlegm.  She is on oxygen so wants to know if Dr. Leveda AnnaHensel wants her to try OTC meds or an abx. Ahnyla Mendel, Maryjo RochesterJessica Dawn, CMA

## 2018-06-20 ENCOUNTER — Telehealth: Payer: Self-pay

## 2018-06-20 ENCOUNTER — Other Ambulatory Visit (INDEPENDENT_AMBULATORY_CARE_PROVIDER_SITE_OTHER): Payer: Medicare Other | Admitting: *Deleted

## 2018-06-20 DIAGNOSIS — I482 Chronic atrial fibrillation, unspecified: Secondary | ICD-10-CM

## 2018-06-20 LAB — POCT INR: INR: 7.9 — AB (ref 2.0–3.0)

## 2018-06-20 NOTE — Telephone Encounter (Signed)
Claris CheMargaret, a Futures tradercare manager with [facility], says that the RN, Lorin PicketScott, went to home to draw blood for venipuncture since INR was 7.9. Unfortunately, it was not a sufficient quantity. Tried to draw more but patient was too dehydrated.  He will return tomorrow, 06/21/18 to get venipuncture sample. Patient is aware to hold dose.  Scott call back [phone] Claris CheMargaret call back [phone]  I also made Desiree in the lab aware.   Ples SpecterAlisa Telia Amundson, RN [facility] RN)

## 2018-06-21 ENCOUNTER — Other Ambulatory Visit: Payer: Self-pay | Admitting: *Deleted

## 2018-06-21 LAB — POCT INR: INR: 5.4 — AB (ref 2.0–3.0)

## 2018-06-21 NOTE — Telephone Encounter (Signed)
Received fax report that INR=7.2 on 6/25.  Still elevated from earlier 7.9.  Called [facility] and spoke to Facilities managernurse manager.  She verified that all doses of coumadin continue to be held and that the INR will be rechecked on 6/27.

## 2018-06-21 NOTE — Progress Notes (Signed)
Critical INR reported to Dr Gwendolyn GrantWalden at 11:47 on 06/21/2018. Sylvia Craig, Sylvia Medinobert Craig

## 2018-06-22 ENCOUNTER — Other Ambulatory Visit: Payer: Self-pay | Admitting: *Deleted

## 2018-06-22 ENCOUNTER — Ambulatory Visit: Payer: Medicare Other | Admitting: Family Medicine

## 2018-06-22 LAB — POCT INR: INR: 3.7 — AB (ref 2.0–3.0)

## 2018-06-28 ENCOUNTER — Ambulatory Visit: Payer: Medicare Other | Admitting: Cardiovascular Disease

## 2018-06-28 ENCOUNTER — Other Ambulatory Visit: Payer: Self-pay | Admitting: *Deleted

## 2018-06-28 LAB — POCT INR: INR: 2.1 (ref 2.0–3.0)

## 2018-07-04 ENCOUNTER — Other Ambulatory Visit: Payer: Self-pay | Admitting: *Deleted

## 2018-07-04 LAB — POCT INR: INR: 1.9 — AB (ref 2.0–3.0)

## 2018-07-09 ENCOUNTER — Other Ambulatory Visit: Payer: Self-pay | Admitting: Family Medicine

## 2018-07-11 ENCOUNTER — Other Ambulatory Visit (INDEPENDENT_AMBULATORY_CARE_PROVIDER_SITE_OTHER): Payer: Medicare Other | Admitting: *Deleted

## 2018-07-11 LAB — POCT INR: INR: 3 (ref 2.0–3.0)

## 2018-07-15 ENCOUNTER — Other Ambulatory Visit: Payer: Self-pay | Admitting: Family Medicine

## 2018-07-15 DIAGNOSIS — I509 Heart failure, unspecified: Secondary | ICD-10-CM

## 2018-07-15 DIAGNOSIS — Z09 Encounter for follow-up examination after completed treatment for conditions other than malignant neoplasm: Secondary | ICD-10-CM

## 2018-07-18 ENCOUNTER — Other Ambulatory Visit (INDEPENDENT_AMBULATORY_CARE_PROVIDER_SITE_OTHER): Payer: Medicare Other | Admitting: *Deleted

## 2018-07-18 LAB — POCT INR: INR: 6.1 — AB (ref 2.0–3.0)

## 2018-07-18 NOTE — Progress Notes (Signed)
Home health nurse instructed to have patient hold coumadin x 2 days and recheck on Thursday. Also instructed nurse to drawn Protime INR and send to reference lab per protocol of POC INR >5.0. Critical result and dosing instructions given by Dr Leveda AnnaHensel. Sylvia Craig, Sylvia Craig

## 2018-07-20 ENCOUNTER — Other Ambulatory Visit (INDEPENDENT_AMBULATORY_CARE_PROVIDER_SITE_OTHER): Payer: Medicare Other | Admitting: *Deleted

## 2018-07-20 LAB — POCT INR: INR: 4 — AB (ref 2.0–3.0)

## 2018-07-22 ENCOUNTER — Telehealth: Payer: Self-pay | Admitting: Family Medicine

## 2018-07-22 NOTE — Telephone Encounter (Signed)
Sylvia Craig called to inform me that patient is deceased. Last seen alive by family at 1 AM today. Passed at 9 AM

## 2018-07-27 DEATH — deceased

## 2018-08-01 ENCOUNTER — Encounter: Payer: Self-pay | Admitting: Family Medicine

## 2018-08-01 NOTE — Progress Notes (Signed)
Death Certification completed by Acquanetta Bellingodd Zaylen Susman, MD. At [facility] of [facility] [place], the [facility] ([phone]) Manner of Death: natural Underlying Cause of Death: Coronary Atherosclerosis Contributing causes: Morbid obesity; OSA: Afib; DMT2: Systolic ventricular dysfunction

## 2018-08-11 ENCOUNTER — Ambulatory Visit: Payer: Medicare Other | Admitting: Interventional Cardiology

## 2018-08-15 ENCOUNTER — Encounter: Payer: Self-pay | Admitting: Interventional Cardiology

## 2019-02-27 IMAGING — DX DG HAND COMPLETE 3+V*L*
3 series · 3 of 3 positions shown · non-contrast
Comparison: None.

CLINICAL DATA: Patient status post fall. Left hand wound around the
posterior aspect of the carpal bones. Initial encounter.

EXAM:
LEFT HAND - COMPLETE 3+ VIEW

[x hand pa left]
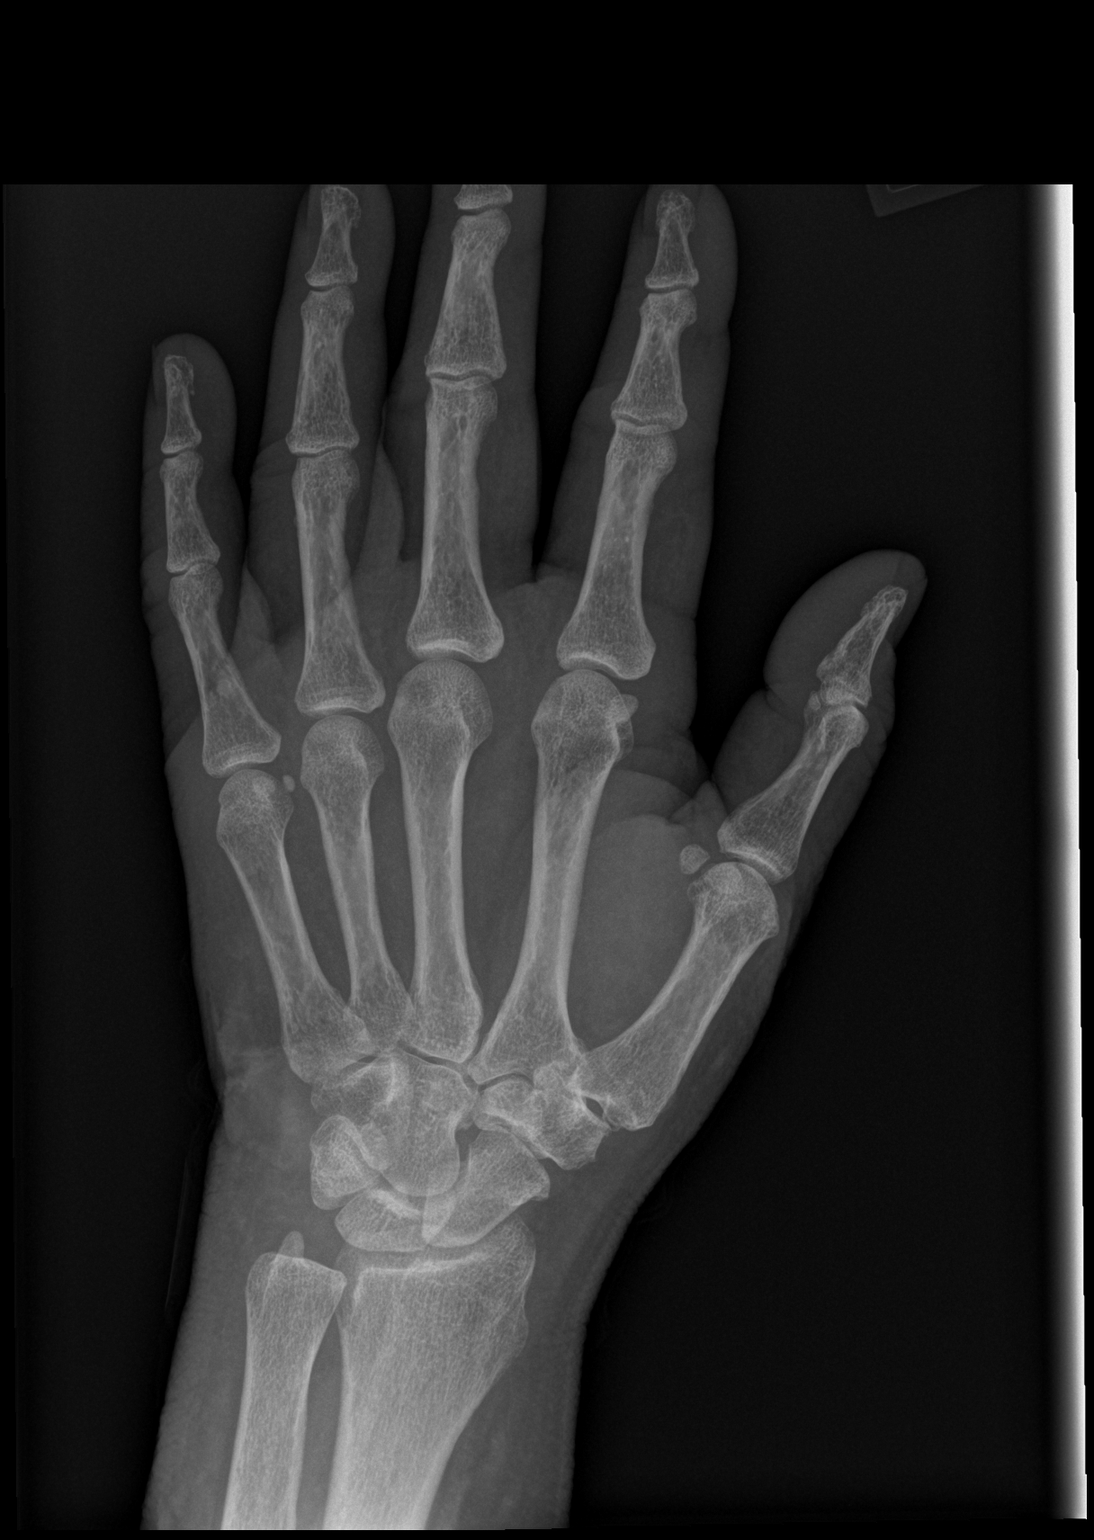

[x hand obl left]
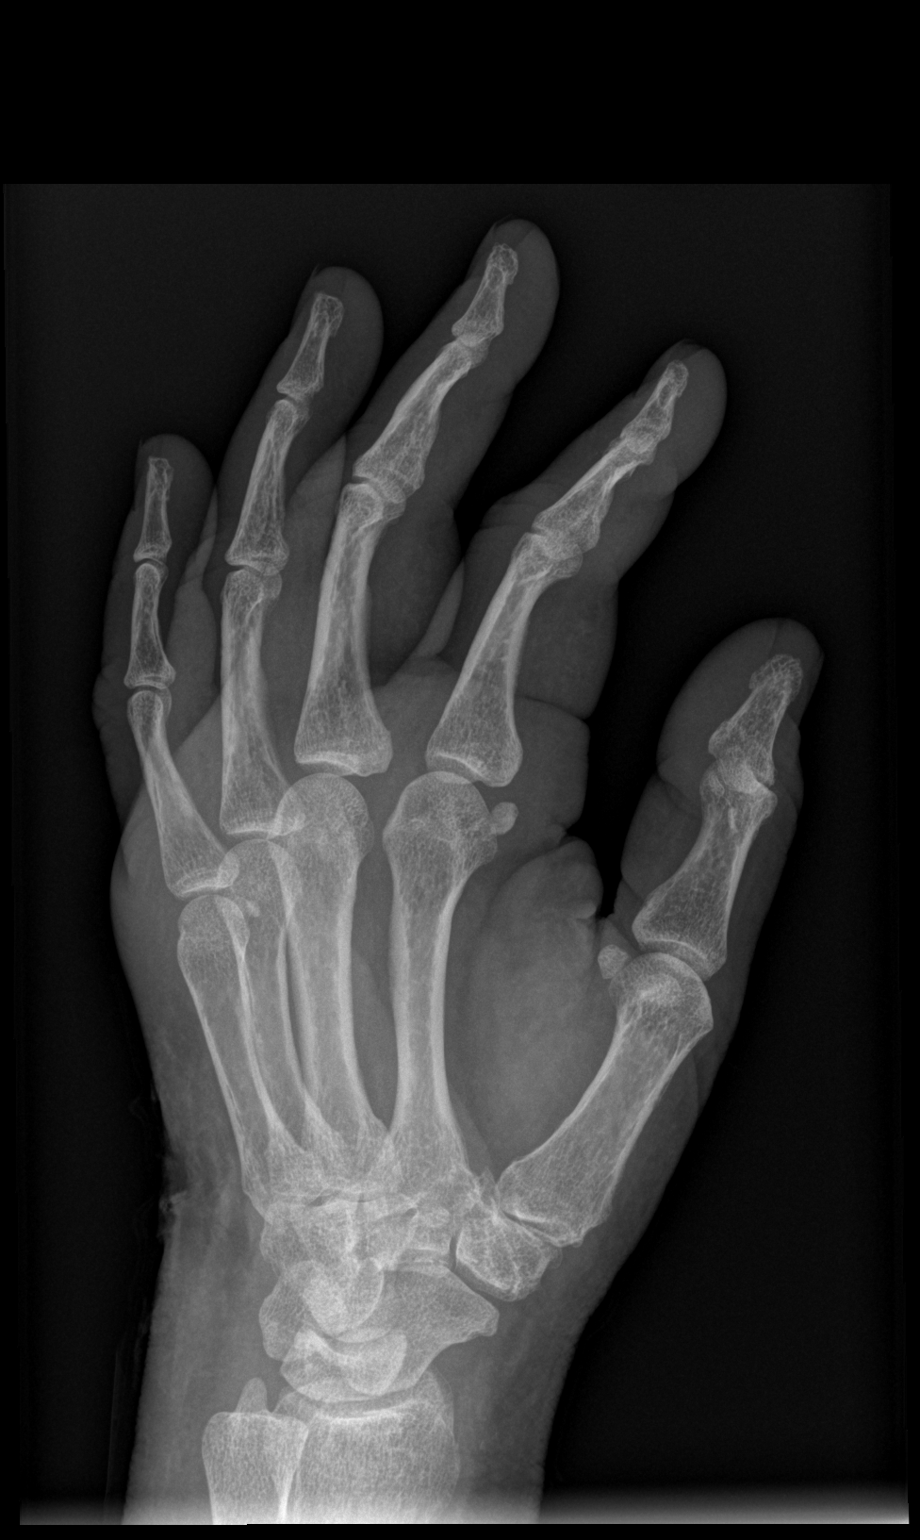

[x hand lat left]
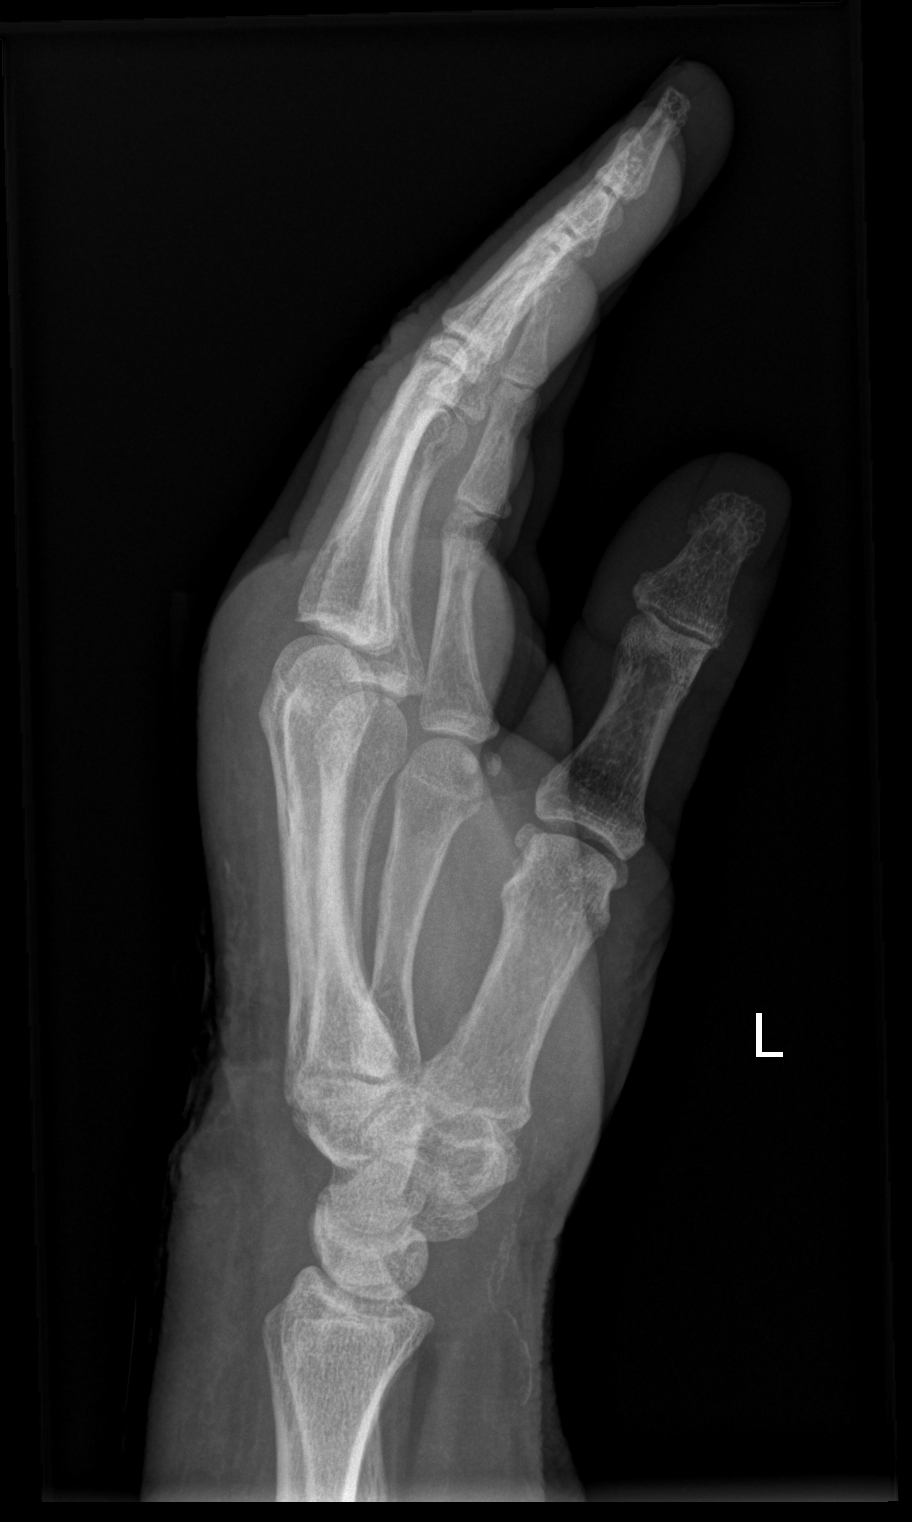

[3 of 3 positions shown; findings below may reference images not displayed]

FINDINGS: Soft tissue defect overlying the dorsal aspect of the hand. Vascular
calcifications. Normal anatomic alignment. No evidence for acute
fracture or dislocation.
IMPRESSION: No acute osseous abnormality.

Soft tissue injury overlying the dorsal aspect of the hand.

## 2019-02-27 IMAGING — DX DG CHEST 1V PORT
1 series · 1 of 1 positions shown · non-contrast
Comparison: 07/24/2017

CLINICAL DATA: Shortness of breath.  Postoperative patient.

EXAM:
PORTABLE CHEST 1 VIEW

[chest ap]
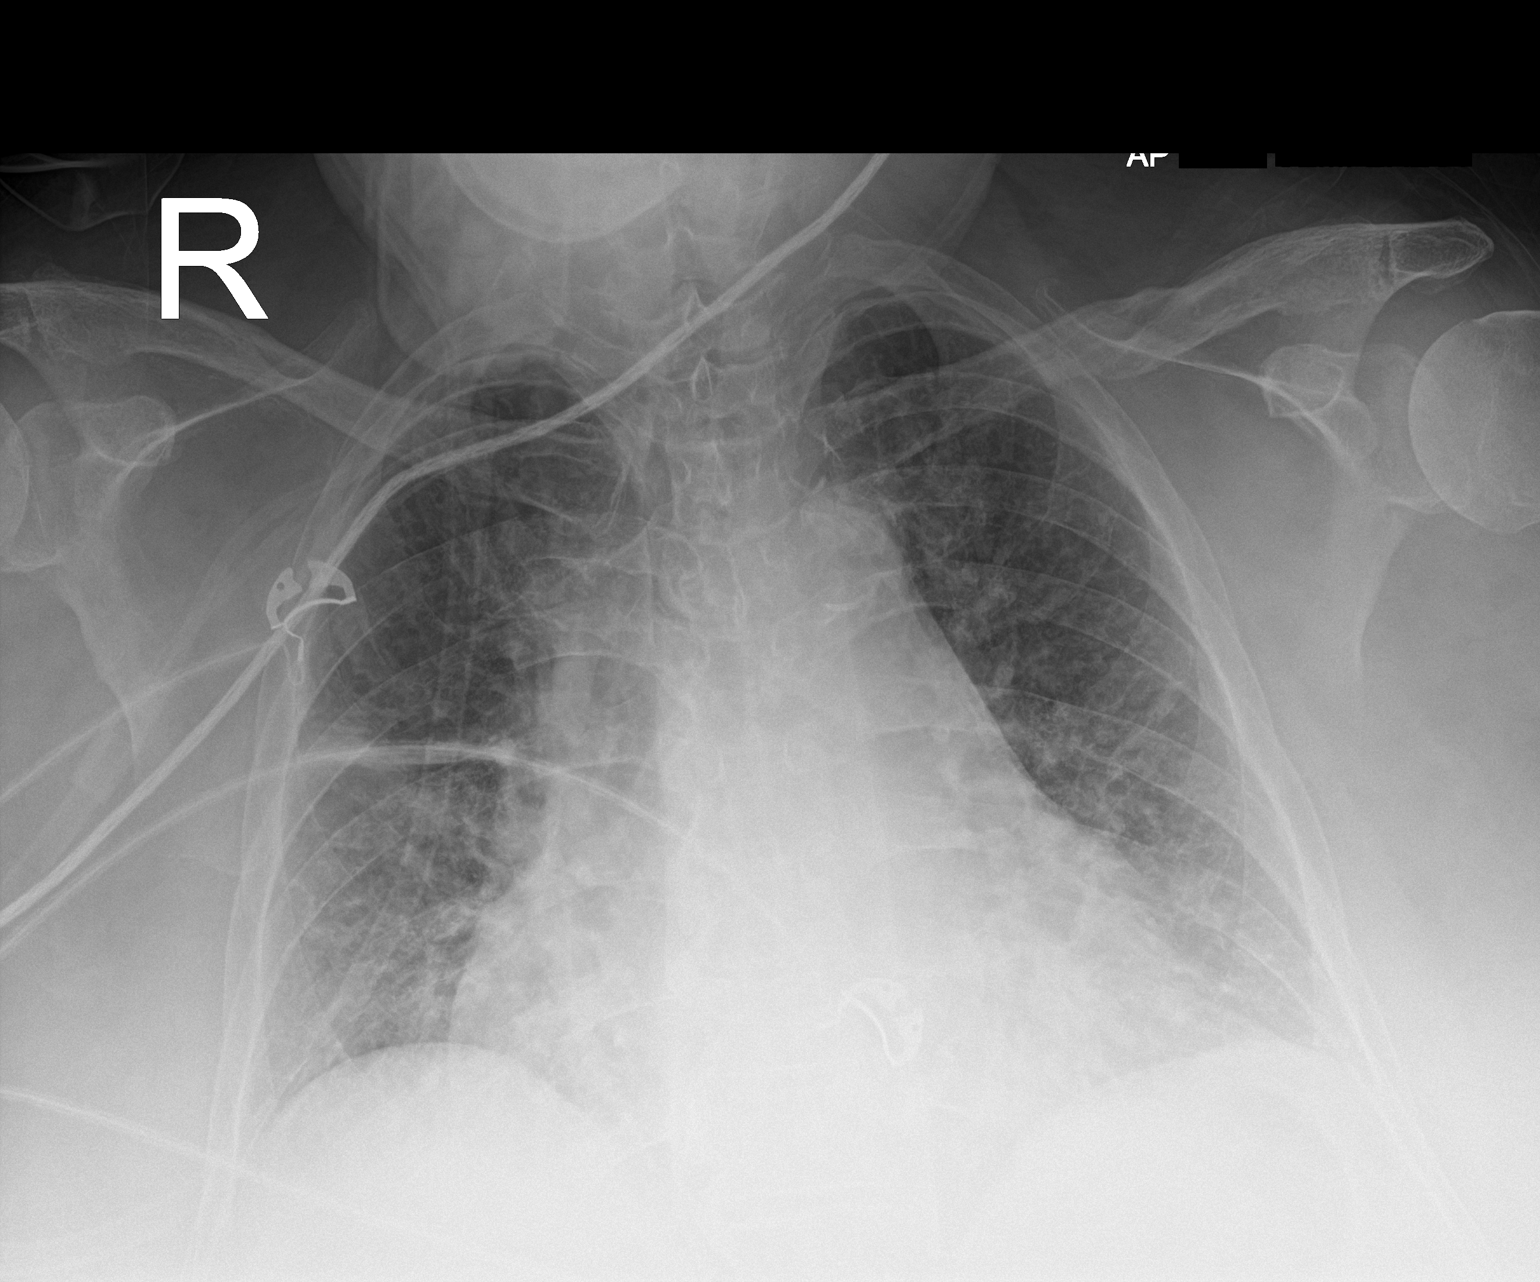

[1 of 1 positions shown; findings below may reference images not displayed]

FINDINGS: Shallow inspiration with atelectasis in the lung bases. Cardiac
enlargement. Pulmonary vascularity is normal for technique. No
consolidation or edema. No blunting of costophrenic angles. No
pneumothorax. Calcification of the aorta. Old healed fracture
deformity of the left clavicle.
IMPRESSION: Shallow inspiration with atelectasis in the lung bases. Cardiac
enlargement. No consolidation.

## 2019-04-04 IMAGING — DX DG CHEST 1V PORT
1 series · 1 of 1 positions shown · non-contrast
Comparison: 08/25/2017

CLINICAL DATA: Shortness of breath, CHF, diabetes and hypertension

EXAM:
PORTABLE CHEST 1 VIEW

[chest ap]
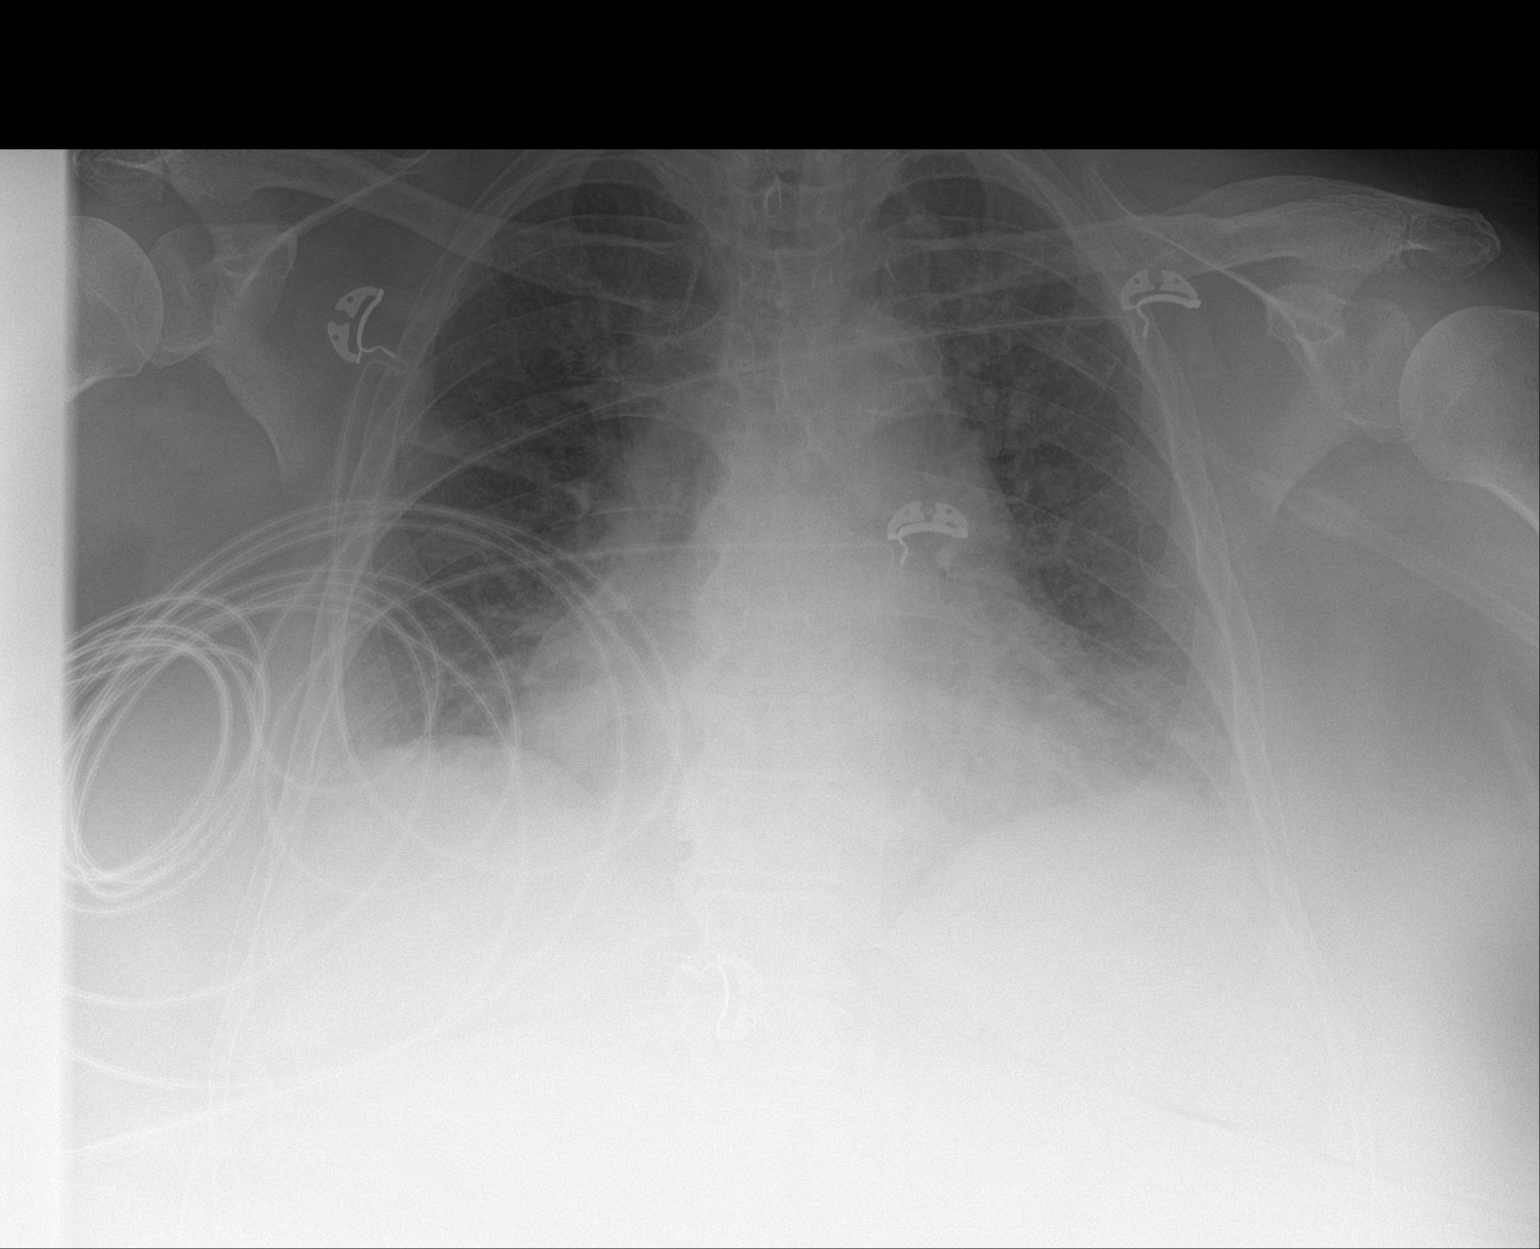

[1 of 1 positions shown; findings below may reference images not displayed]

FINDINGS: Stable cardiomegaly with vascular and interstitial prominence
diffusely, suspect mild edema. Minor associated basilar atelectasis.
No enlarging effusion or pneumothorax. Trachea is midline.
Atherosclerosis of the aorta and degenerative changes of the spine.
IMPRESSION: Stable cardiomegaly and mild interstitial edema pattern.

Bibasilar atelectasis.
